# Patient Record
Sex: Female | Born: 1935 | Race: White | Hispanic: No | Marital: Married | Smoking: Never smoker
Health system: Southern US, Community
[De-identification: ages and names within clinical notes are randomized; demographics above are authoritative.]

## PROBLEM LIST (undated history)

## (undated) DIAGNOSIS — M1990 Unspecified osteoarthritis, unspecified site: Secondary | ICD-10-CM

## (undated) DIAGNOSIS — E039 Hypothyroidism, unspecified: Secondary | ICD-10-CM

## (undated) DIAGNOSIS — E079 Disorder of thyroid, unspecified: Secondary | ICD-10-CM

## (undated) DIAGNOSIS — F419 Anxiety disorder, unspecified: Secondary | ICD-10-CM

## (undated) DIAGNOSIS — I10 Essential (primary) hypertension: Secondary | ICD-10-CM

## (undated) HISTORY — PX: TONSILLECTOMY: SUR1361

## (undated) HISTORY — PX: TUBAL LIGATION: SHX77

---

## 1991-10-03 HISTORY — PX: BREAST SURGERY: SHX581

## 1998-07-27 ENCOUNTER — Other Ambulatory Visit: Admission: RE | Admit: 1998-07-27 | Discharge: 1998-07-27 | Payer: Self-pay | Admitting: Obstetrics and Gynecology

## 1999-07-29 ENCOUNTER — Other Ambulatory Visit: Admission: RE | Admit: 1999-07-29 | Discharge: 1999-07-29 | Payer: Self-pay | Admitting: *Deleted

## 2000-08-01 ENCOUNTER — Other Ambulatory Visit: Admission: RE | Admit: 2000-08-01 | Discharge: 2000-08-01 | Payer: Self-pay | Admitting: *Deleted

## 2002-10-07 ENCOUNTER — Other Ambulatory Visit: Admission: RE | Admit: 2002-10-07 | Discharge: 2002-10-07 | Payer: Self-pay | Admitting: Obstetrics and Gynecology

## 2003-03-09 ENCOUNTER — Encounter: Payer: Self-pay | Admitting: Internal Medicine

## 2003-03-09 ENCOUNTER — Ambulatory Visit (HOSPITAL_COMMUNITY): Admission: RE | Admit: 2003-03-09 | Discharge: 2003-03-09 | Payer: Self-pay | Admitting: Internal Medicine

## 2003-03-26 ENCOUNTER — Ambulatory Visit (HOSPITAL_COMMUNITY): Admission: RE | Admit: 2003-03-26 | Discharge: 2003-03-26 | Payer: Self-pay | Admitting: Internal Medicine

## 2003-03-26 ENCOUNTER — Encounter: Payer: Self-pay | Admitting: Internal Medicine

## 2004-06-30 ENCOUNTER — Ambulatory Visit (HOSPITAL_COMMUNITY): Admission: RE | Admit: 2004-06-30 | Discharge: 2004-06-30 | Payer: Self-pay | Admitting: Internal Medicine

## 2004-09-16 ENCOUNTER — Ambulatory Visit (HOSPITAL_COMMUNITY): Admission: RE | Admit: 2004-09-16 | Discharge: 2004-09-16 | Payer: Self-pay | Admitting: Internal Medicine

## 2005-03-22 ENCOUNTER — Encounter: Payer: Self-pay | Admitting: Internal Medicine

## 2005-03-22 ENCOUNTER — Ambulatory Visit: Payer: Self-pay | Admitting: Internal Medicine

## 2005-03-22 ENCOUNTER — Ambulatory Visit (HOSPITAL_COMMUNITY): Admission: RE | Admit: 2005-03-22 | Discharge: 2005-03-22 | Payer: Self-pay | Admitting: Internal Medicine

## 2008-03-05 ENCOUNTER — Ambulatory Visit: Payer: Self-pay | Admitting: Gastroenterology

## 2008-04-01 ENCOUNTER — Ambulatory Visit: Payer: Self-pay | Admitting: Gastroenterology

## 2008-04-01 ENCOUNTER — Encounter: Payer: Self-pay | Admitting: Gastroenterology

## 2008-04-01 ENCOUNTER — Ambulatory Visit (HOSPITAL_COMMUNITY): Admission: RE | Admit: 2008-04-01 | Discharge: 2008-04-01 | Payer: Self-pay | Admitting: Gastroenterology

## 2009-04-29 ENCOUNTER — Ambulatory Visit (HOSPITAL_COMMUNITY): Admission: RE | Admit: 2009-04-29 | Discharge: 2009-04-29 | Payer: Self-pay | Admitting: Ophthalmology

## 2010-10-19 LAB — BASIC METABOLIC PANEL
BUN: 14 mg/dL (ref 6–23)
CO2: 29 mEq/L (ref 19–32)
Calcium: 9.4 mg/dL (ref 8.4–10.5)
Chloride: 97 mEq/L (ref 96–112)
Creatinine, Ser: 1.12 mg/dL (ref 0.4–1.2)
GFR calc Af Amer: 57 mL/min — ABNORMAL LOW (ref >60)
GFR calc non Af Amer: 47 mL/min — ABNORMAL LOW (ref >60)
Glucose, Bld: 83 mg/dL (ref 70–99)
Potassium: 3.5 mEq/L (ref 3.5–5.1)
Sodium: 133 mEq/L — ABNORMAL LOW (ref 135–145)

## 2010-10-19 LAB — HEMOGLOBIN AND HEMATOCRIT, BLOOD
HCT: 35.6 % — ABNORMAL LOW (ref 36.0–46.0)
Hemoglobin: 12.8 g/dL (ref 12.0–15.0)

## 2010-10-24 ENCOUNTER — Ambulatory Visit (HOSPITAL_COMMUNITY)
Admission: RE | Admit: 2010-10-24 | Discharge: 2010-10-24 | Payer: Self-pay | Source: Home / Self Care | Attending: Ophthalmology | Admitting: Ophthalmology

## 2011-01-08 LAB — BASIC METABOLIC PANEL
BUN: 19 mg/dL (ref 6–23)
CO2: 27 mEq/L (ref 19–32)
Calcium: 9.2 mg/dL (ref 8.4–10.5)
Chloride: 97 mEq/L (ref 96–112)
Creatinine, Ser: 0.97 mg/dL (ref 0.4–1.2)
GFR calc Af Amer: >60 mL/min (ref >60)
GFR calc non Af Amer: 56 mL/min — ABNORMAL LOW (ref >60)
Glucose, Bld: 120 mg/dL — ABNORMAL HIGH (ref 70–99)
Potassium: 3.7 mEq/L (ref 3.5–5.1)
Sodium: 132 mEq/L — ABNORMAL LOW (ref 135–145)

## 2011-01-08 LAB — HEMOGLOBIN AND HEMATOCRIT, BLOOD
HCT: 34.3 % — ABNORMAL LOW (ref 36.0–46.0)
Hemoglobin: 12.4 g/dL (ref 12.0–15.0)

## 2011-02-14 NOTE — Op Note (Signed)
[name], [name]                ACCOUNT NO.:  [id number]   MEDICAL RECORD NO.:  [id number]          PATIENT TYPE:  AMB   LOCATION:  DAY                           FACILITY:  [facility]   PHYSICIAN:  Kassie Mends, M.D.      DATE OF BIRTH:  Feb 17, 1933   DATE OF PROCEDURE:  04/01/2008  DATE OF DISCHARGE:                               OPERATIVE REPORT   REFERRING PHYSICIAN:  Kingsley Callander. Ouida Sills, MD   PROCEDURE:  Colonoscopy with cold forceps polypectomy.   INDICATION FOR EXAMINATION:  [name] is a 77 year old female with a  history of adenomatous polyps.   FINDINGS:  1. A 4-mm sessile ascending colon polyp removed via cold forceps.  A 3-      mm sessile transverse colon polyp removed via cold forceps.  A 3-mm      sessile rectal polyp removed via cold forceps.  2. Single diverticulum seen at the rectosigmoid junction.  3. Small internal hemorrhoids.  Otherwise, no masses, inflammatory      changes, or AVMs seen.   DIAGNOSIS:  Small adenomatous-appearing polyps.   RECOMMENDATIONS:  1. Will call [name] with the results of her biopsies.  She will be      scheduled for a screening colonoscopy in 10 years unless she has      evidence of an advanced adenoma.  2. No aspirin, NSAIDs, or anticoagulation for 5 days.  3. She should follow a high-fiber diet.  She is given a handout on      high-fiber diet, polyps, diverticulosis, and hemorrhoids.   MEDICATIONS:  1. Demerol 50 mg IV.  2. Versed 2 mg IV.   PROCEDURE TECHNIQUE:  Physical exam was performed.  Informed consents  was obtained from the patient explaining benefits, risks, and  alternatives.  The  patient was connected to monitor and placed in the  left lateral position.  Continuous oxygen was provided by nasal cannula.  IV medicine administered through an indwelling cannula.  After  administration of sedation and rectal exam, the patient's rectum  intubated.  The scope advanced under direct visualization to the cecum.  The scope was  removed slowly by carefully examining the color, texture,  anatomy, and integrity mucosa on the way out.  The patient was recovered  in endoscopy and discharged home in satisfactory condition.   PATH:  Hyperpalstic polyps.      Kassie Mends, M.D.  Electronically Signed     SM/MEDQ  D:  04/01/2008  T:  04/01/2008  Job:  [id number]   cc:   Kingsley Callander. Ouida Sills, MD  Fax: [phone]

## 2011-02-14 NOTE — H&P (Signed)
NAMEANNALEAH, [name]                ACCOUNT NO.:  [id number]   MEDICAL RECORD NO.:  [id number]          PATIENT TYPE:  AMB   LOCATION:  DAY                           FACILITY:  [facility]   PHYSICIAN:  Kassie Mends, M.D.      DATE OF BIRTH:  11-Mar-1933   DATE OF ADMISSION:  DATE OF DISCHARGE:  LH                              HISTORY & PHYSICAL   PRIMARY CARE PHYSICIAN:  Kingsley Callander. Ouida Sills, MD   CHIEF COMPLAINT:  Followup adenomatous colon polyps/constipation.   HISTORY OF PRESENT ILLNESS:  [name] is a 77 year old Caucasian  female.  She has a history of adenomatous cecal polyp with adenomatous  change, which extended to the margins.  Her polyp was 6 mm and  pedunculated.  She has been having chronic constipation and some  tenesmus.  She has been using stool softeners, which did not seem to  help.  She has tried suppositories, which seemed to help some as well as  Dulcolax.  She has started Metamucil b.i.d. as well as prune juice, this  seems to help the most.  She is now having 1-2 soft bowel movements per  day.  Previously, she was going up to 2 days without a bowel movement  and having to take laxatives.  She tells me she was previously straining  and sitting long periods of time on the commode.  She has noticed one  account of small bright red blood on the toilet paper with wiping and  felt this was due to a hemorrhoid about 10 days ago.  She denies any  abdominal pain, except for an occasional cramp when she takes laxatives.  Denies any nausea, vomiting, heartburn, indigestion, dysphagia, or  odynophagia.  Her weight has remained stable.  She denies any anorexia.   PAST MEDICAL/SURGICAL HISTORY:  1. History of adenomatous cecal polyp with changes which extended to      the margins on colonoscopy on March 22, 2005.  2. Hypertension.  3. Hyperlipidemia.  4. Hypothyroidism.  5. Anxiety.  6. Vertigo.  7. Tubal ligation.  8. Tonsillectomy.   ALLERGIES:  SULFA.   CURRENT MEDICATIONS:  1. Levothyroxine 100 mcg daily.  2. Simvastatin 40 mg daily.  3. Lisinopril/hydrochlorothiazide 20/25 mg daily.  4. Fluoxetine 20 mg daily.  5. Diazepam 2 mg daily.  6. Trazodone 100 mg daily.  7. PreserVision once daily.  8. Multivitamin daily.  9. Metamucil b.i.d.   FAMILY HISTORY:  There is no known family history of colorectal  carcinoma, liver, or chronic GI problems.  Mother deceased at 5,  secondary to old age.  Father deceased at age 26, secondary to CVA.  She  has lost one sister due to lung disease.  One sister with rheumatoid  arthritis and two siblings with hypertension.  One healthy.   SOCIAL HISTORY:  [name] is married.  She has three healthy children.  She is originally from [place], [place], who moved to the [place] in 1958.  She works part-time with the mentally challenged.  She denies any  tobacco, alcohol, or drug use.   REVIEW OF  SYSTEMS:  See HPI, otherwise negative.   PHYSICAL EXAMINATION:  VITAL SIGNS:  Weight 162 pounds, height 66-1/2  inches, temperature 98.1, blood pressure 154/78, and pulse 72.  GENERAL:  [name] is a 77 year old female, who is alert, oriented,  pleasant, and cooperative in no acute distress.  HEENT:  Sclerae clear, nonicteric, conjunctivae pink.  Oropharynx pink  and moist without any lesions.NECK:  Supple without any mass or  thyromegaly.CHEST:  Heart, regular rate and rhythm.  Normal S1 and S2,  without murmurs, clicks, rubs, or gallops.LUNGS:  Clear to auscultation  bilaterally.ABDOMEN:  Positive bowel sounds x4.  No bruits auscultated.  Soft, nontender, and nondistended.  No palpable mass or  hepatosplenomegaly.  No rebound tenderness or guarding.  EXTREMITIES:  Without clubbing or edema.SKIN:  Pink, warm, and dry  without any rash or jaundice.   IMPRESSION:  [name] is a 77 year old female with history of  constipation, which has responded to fiber.  She has also had small  volume of hematochezia on one occasion and has  history of fecal  adenomatous polyp, which extended to the margins on last colonoscopy 3  years ago.  She is due for followup colonoscopy at this time.   PLAN:  Colonoscopy with Dr. Kassie Mends in the near future.  I have  discussed this procedure including risks and benefits to include, but  not limited to bleeding, infection, perforation, or drug reaction.  She  agrees with the plan, and consent will be obtained.  She is requesting change from Dr. Jena Gauss to Dr. Cira Servant' care due to  scheduling conflict.   Thank you Dr. Ouida Sills for allowing me to participate in the care of [name].      Lorenza Burton, N.P.      Kassie Mends, M.D.  Electronically Signed   KJ/MEDQ  D:  03/05/2008  T:  03/06/2008  Job:  [id number]   cc:   Kingsley Callander. Ouida Sills, MD  Fax: [phone]

## 2011-02-17 NOTE — Op Note (Signed)
[name], [name]                ACCOUNT NO.:  [id number]   MEDICAL RECORD NO.:  [id number]          PATIENT TYPE:  AMB   LOCATION:  DAY                           FACILITY:  [facility]   PHYSICIAN:  R. Roetta Sessions, M.D. DATE OF BIRTH:  1933-08-03   DATE OF PROCEDURE:  03/22/2005  DATE OF DISCHARGE:                                 OPERATIVE REPORT   PROCEDURE:  Colonoscopy with snare polypectomy.   INDICATIONS:  The patient is a 77 year old lady referred at the courtesy of  Dr. Ouida Sills, devoid of any lower GI tract symptoms who comes for colon cancer  screening.  She had a sigmoidoscopy by Dr. Ouida Sills a number of years ago  without significant findings.  She stated the above but does not have any  bowel symptoms.  There is no family history of colorectal neoplasia.  Colonoscopy is now being done as a standard screening maneuver.  This  approach has been discussed with the patient and family.  The potential  risks, benefits and alternatives have been reviewed, questions answered and  he is agreeable.  Please see documentation in the medical record.   DESCRIPTION OF PROCEDURE:  Oxygen saturation, blood pressure, pulse and  respiration were monitored throughout the entire procedure.  Conscious  sedation with Versed 3 mg IV and Demerol 50 mg IV in divided doses.  The  instrument was the Olympus video chip system.   FINDINGS:  Digital rectal exam initially revealed no abnormalities.   ENDOSCOPIC FINDINGS:  Prep was good.   Rectum:  Examination of the rectal mucosa including retroflexion in the anal  verge revealed no abnormalities.   Colon:  Colonic mucosa was surveyed from the rectosigmoid junction through  the left, transverse,  right colon to the area of the appendiceal orifice,  ileocecal valve and cecum.  These structures were well seen, photographed  for the record.  From the level the scope was slowly withdrawn.  All  previously mentioned mucosal surfaces were again seen.  The  patient was  noted to have a 6 mm pedunculated polyp in the base of the cecum a few  centimeters over from the appendiceal orifice.  It was cold snared and  recovered through the scope.  The remainder of the colonic mucosa appeared  normal.  The patient tolerated the procedure well and was reactive after  endoscopy.   IMPRESSION:  1.  Normal rectum.  2.  Cecal polyp.  3.  Otherwise normal colon.  4.  Polyp removed with snare as described above.   RECOMMENDATIONS:  1.  No aspirin or __________ medications for 10 days.  2.  Follow up on pathology.  3.  Further recommendations to follow.       RMR/MEDQ  D:  03/22/2005  T:  03/22/2005  Job:  [id number]   cc:   Kingsley Callander. Ouida Sills, MD  [address]  [place]  [place] [address]  Fax: [phone]

## 2012-10-25 ENCOUNTER — Ambulatory Visit (HOSPITAL_COMMUNITY)
Admission: RE | Admit: 2012-10-25 | Discharge: 2012-10-25 | Disposition: A | Discharge status: Home / Self Care | Payer: Medicare Other | Source: Ambulatory Visit | Attending: Internal Medicine | Admitting: Internal Medicine

## 2012-10-25 ENCOUNTER — Other Ambulatory Visit (HOSPITAL_COMMUNITY): Payer: Self-pay | Admitting: Internal Medicine

## 2012-10-25 DIAGNOSIS — W19XXXA Unspecified fall, initial encounter: Secondary | ICD-10-CM

## 2012-10-25 DIAGNOSIS — S32509A Unspecified fracture of unspecified pubis, initial encounter for closed fracture: Secondary | ICD-10-CM | POA: Insufficient documentation

## 2012-10-25 DIAGNOSIS — M25559 Pain in unspecified hip: Secondary | ICD-10-CM | POA: Insufficient documentation

## 2014-05-12 ENCOUNTER — Other Ambulatory Visit (HOSPITAL_COMMUNITY): Payer: Self-pay | Admitting: Internal Medicine

## 2014-05-12 ENCOUNTER — Ambulatory Visit (HOSPITAL_COMMUNITY)
Admission: RE | Admit: 2014-05-12 | Discharge: 2014-05-12 | Disposition: A | Discharge status: Home / Self Care | Payer: Medicare Other | Source: Ambulatory Visit | Attending: Internal Medicine | Admitting: Internal Medicine

## 2014-05-12 DIAGNOSIS — R52 Pain, unspecified: Secondary | ICD-10-CM | POA: Diagnosis present

## 2014-05-12 DIAGNOSIS — M25559 Pain in unspecified hip: Secondary | ICD-10-CM | POA: Diagnosis not present

## 2014-05-12 DIAGNOSIS — M76899 Other specified enthesopathies of unspecified lower limb, excluding foot: Secondary | ICD-10-CM | POA: Diagnosis not present

## 2014-10-19 DIAGNOSIS — Z961 Presence of intraocular lens: Secondary | ICD-10-CM | POA: Diagnosis not present

## 2014-10-19 DIAGNOSIS — H524 Presbyopia: Secondary | ICD-10-CM | POA: Diagnosis not present

## 2014-10-19 DIAGNOSIS — H35373 Puckering of macula, bilateral: Secondary | ICD-10-CM | POA: Diagnosis not present

## 2015-01-22 DIAGNOSIS — M9903 Segmental and somatic dysfunction of lumbar region: Secondary | ICD-10-CM | POA: Diagnosis not present

## 2015-01-22 DIAGNOSIS — M9905 Segmental and somatic dysfunction of pelvic region: Secondary | ICD-10-CM | POA: Diagnosis not present

## 2015-01-22 DIAGNOSIS — M5441 Lumbago with sciatica, right side: Secondary | ICD-10-CM | POA: Diagnosis not present

## 2015-01-22 DIAGNOSIS — M9902 Segmental and somatic dysfunction of thoracic region: Secondary | ICD-10-CM | POA: Diagnosis not present

## 2015-01-25 DIAGNOSIS — M9905 Segmental and somatic dysfunction of pelvic region: Secondary | ICD-10-CM | POA: Diagnosis not present

## 2015-01-25 DIAGNOSIS — M9903 Segmental and somatic dysfunction of lumbar region: Secondary | ICD-10-CM | POA: Diagnosis not present

## 2015-01-25 DIAGNOSIS — M9902 Segmental and somatic dysfunction of thoracic region: Secondary | ICD-10-CM | POA: Diagnosis not present

## 2015-01-25 DIAGNOSIS — M5441 Lumbago with sciatica, right side: Secondary | ICD-10-CM | POA: Diagnosis not present

## 2015-01-28 DIAGNOSIS — M5416 Radiculopathy, lumbar region: Secondary | ICD-10-CM | POA: Diagnosis not present

## 2015-02-09 DIAGNOSIS — M1611 Unilateral primary osteoarthritis, right hip: Secondary | ICD-10-CM | POA: Diagnosis not present

## 2015-03-02 DIAGNOSIS — E039 Hypothyroidism, unspecified: Secondary | ICD-10-CM | POA: Diagnosis not present

## 2015-03-02 DIAGNOSIS — E785 Hyperlipidemia, unspecified: Secondary | ICD-10-CM | POA: Diagnosis not present

## 2015-03-02 DIAGNOSIS — Z79899 Other long term (current) drug therapy: Secondary | ICD-10-CM | POA: Diagnosis not present

## 2015-03-02 DIAGNOSIS — I10 Essential (primary) hypertension: Secondary | ICD-10-CM | POA: Diagnosis not present

## 2015-03-03 DIAGNOSIS — M1611 Unilateral primary osteoarthritis, right hip: Secondary | ICD-10-CM | POA: Diagnosis not present

## 2015-03-09 ENCOUNTER — Other Ambulatory Visit (HOSPITAL_COMMUNITY): Payer: Self-pay | Admitting: Internal Medicine

## 2015-03-09 DIAGNOSIS — N183 Chronic kidney disease, stage 3 (moderate): Secondary | ICD-10-CM | POA: Diagnosis not present

## 2015-03-09 DIAGNOSIS — E039 Hypothyroidism, unspecified: Secondary | ICD-10-CM | POA: Diagnosis not present

## 2015-03-09 DIAGNOSIS — I10 Essential (primary) hypertension: Secondary | ICD-10-CM | POA: Diagnosis not present

## 2015-03-09 DIAGNOSIS — N390 Urinary tract infection, site not specified: Secondary | ICD-10-CM | POA: Diagnosis not present

## 2015-03-09 DIAGNOSIS — Z1231 Encounter for screening mammogram for malignant neoplasm of breast: Secondary | ICD-10-CM

## 2015-03-09 DIAGNOSIS — N3000 Acute cystitis without hematuria: Secondary | ICD-10-CM | POA: Diagnosis not present

## 2015-03-24 ENCOUNTER — Ambulatory Visit (HOSPITAL_COMMUNITY)
Admission: RE | Admit: 2015-03-24 | Discharge: 2015-03-24 | Disposition: A | Discharge status: Home / Self Care | Payer: Medicare Other | Source: Ambulatory Visit | Attending: Internal Medicine | Admitting: Internal Medicine

## 2015-03-24 DIAGNOSIS — Z1231 Encounter for screening mammogram for malignant neoplasm of breast: Secondary | ICD-10-CM | POA: Insufficient documentation

## 2015-04-03 DIAGNOSIS — M1611 Unilateral primary osteoarthritis, right hip: Secondary | ICD-10-CM | POA: Diagnosis not present

## 2015-04-10 DIAGNOSIS — M1611 Unilateral primary osteoarthritis, right hip: Secondary | ICD-10-CM | POA: Diagnosis not present

## 2015-04-13 ENCOUNTER — Other Ambulatory Visit: Payer: Self-pay | Admitting: Internal Medicine

## 2015-04-13 DIAGNOSIS — R928 Other abnormal and inconclusive findings on diagnostic imaging of breast: Secondary | ICD-10-CM

## 2015-04-14 ENCOUNTER — Other Ambulatory Visit: Payer: Self-pay | Admitting: Surgical

## 2015-04-14 ENCOUNTER — Other Ambulatory Visit: Payer: Self-pay | Admitting: Internal Medicine

## 2015-04-14 MED ORDER — BUPIVACAINE LIPOSOME 1.3 % IJ SUSP: 20.0000 mL | Freq: Once | INTRAMUSCULAR | Status: AC

## 2015-05-04 ENCOUNTER — Encounter (HOSPITAL_COMMUNITY): Payer: Self-pay

## 2015-05-04 ENCOUNTER — Encounter (HOSPITAL_COMMUNITY)
Admission: RE | Admit: 2015-05-04 | Discharge: 2015-05-04 | Disposition: A | Discharge status: Home / Self Care | Payer: Medicare Other | Source: Ambulatory Visit | Attending: Orthopedic Surgery | Admitting: Orthopedic Surgery

## 2015-05-04 ENCOUNTER — Ambulatory Visit (HOSPITAL_COMMUNITY)
Admission: RE | Admit: 2015-05-04 | Discharge: 2015-05-04 | Disposition: A | Discharge status: Home / Self Care | Payer: Medicare Other | Source: Ambulatory Visit | Attending: Surgical | Admitting: Surgical

## 2015-05-04 DIAGNOSIS — Z01810 Encounter for preprocedural cardiovascular examination: Secondary | ICD-10-CM | POA: Diagnosis not present

## 2015-05-04 DIAGNOSIS — Z0183 Encounter for blood typing: Secondary | ICD-10-CM | POA: Diagnosis not present

## 2015-05-04 DIAGNOSIS — I10 Essential (primary) hypertension: Secondary | ICD-10-CM | POA: Insufficient documentation

## 2015-05-04 DIAGNOSIS — Z01818 Encounter for other preprocedural examination: Secondary | ICD-10-CM | POA: Diagnosis not present

## 2015-05-04 DIAGNOSIS — Z01812 Encounter for preprocedural laboratory examination: Secondary | ICD-10-CM | POA: Insufficient documentation

## 2015-05-04 HISTORY — DX: Unspecified osteoarthritis, unspecified site: M19.90

## 2015-05-04 HISTORY — DX: Essential (primary) hypertension: I10

## 2015-05-04 HISTORY — DX: Disorder of thyroid, unspecified: E07.9

## 2015-05-04 LAB — CBC WITH DIFFERENTIAL/PLATELET
Basophils Absolute: 0 10*3/uL (ref 0.0–0.1)
Basophils Relative: 1 % (ref 0–1)
Eosinophils Absolute: 0.4 10*3/uL (ref 0.0–0.7)
Eosinophils Relative: 5 % (ref 0–5)
HCT: 33.6 % — ABNORMAL LOW (ref 36.0–46.0)
Hemoglobin: 11.5 g/dL — ABNORMAL LOW (ref 12.0–15.0)
Lymphocytes Relative: 29 % (ref 12–46)
Lymphs Abs: 2.3 10*3/uL (ref 0.7–4.0)
MCH: 29 pg (ref 26.0–34.0)
MCHC: 34.2 g/dL (ref 30.0–36.0)
MCV: 84.6 fL (ref 78.0–100.0)
Monocytes Absolute: 0.6 10*3/uL (ref 0.1–1.0)
Monocytes Relative: 7 % (ref 3–12)
Neutro Abs: 4.6 10*3/uL (ref 1.7–7.7)
Neutrophils Relative %: 58 % (ref 43–77)
Platelets: 233 10*3/uL (ref 150–400)
RBC: 3.97 MIL/uL (ref 3.87–5.11)
RDW: 12.6 % (ref 11.5–15.5)
WBC: 7.8 10*3/uL (ref 4.0–10.5)

## 2015-05-04 LAB — SURGICAL PCR SCREEN
MRSA, PCR: INVALID — AB
STAPHYLOCOCCUS AUREUS: INVALID — AB

## 2015-05-04 LAB — URINALYSIS, ROUTINE W REFLEX MICROSCOPIC
Bilirubin Urine: NEGATIVE
Glucose, UA: NEGATIVE mg/dL
Ketones, ur: NEGATIVE mg/dL
Nitrite: NEGATIVE
Protein, ur: NEGATIVE mg/dL
Specific Gravity, Urine: 1.013 (ref 1.005–1.030)
Urobilinogen, UA: 1 mg/dL (ref 0.0–1.0)
pH: 6 (ref 5.0–8.0)

## 2015-05-04 LAB — COMPREHENSIVE METABOLIC PANEL
ALT: 10 U/L — ABNORMAL LOW (ref 14–54)
AST: 19 U/L (ref 15–41)
Albumin: 3.8 g/dL (ref 3.5–5.0)
Alkaline Phosphatase: 71 U/L (ref 38–126)
Anion gap: 4 — ABNORMAL LOW (ref 5–15)
BUN: 18 mg/dL (ref 6–20)
CO2: 28 mmol/L (ref 22–32)
Calcium: 8.9 mg/dL (ref 8.9–10.3)
Chloride: 99 mmol/L — ABNORMAL LOW (ref 101–111)
Creatinine, Ser: 1.02 mg/dL — ABNORMAL HIGH (ref 0.44–1.00)
GFR calc Af Amer: 59 mL/min — ABNORMAL LOW (ref >60)
GFR calc non Af Amer: 51 mL/min — ABNORMAL LOW (ref >60)
Glucose, Bld: 113 mg/dL — ABNORMAL HIGH (ref 65–99)
Potassium: 3.8 mmol/L (ref 3.5–5.1)
Sodium: 131 mmol/L — ABNORMAL LOW (ref 135–145)
Total Bilirubin: 0.7 mg/dL (ref 0.3–1.2)
Total Protein: 6.9 g/dL (ref 6.5–8.1)

## 2015-05-04 LAB — URINE MICROSCOPIC-ADD ON

## 2015-05-04 LAB — APTT: APTT: 31 s (ref 24–37)

## 2015-05-04 LAB — PROTIME-INR
INR: 1.05 (ref 0.00–1.49)
Prothrombin Time: 13.9 seconds (ref 11.6–15.2)

## 2015-05-04 NOTE — Patient Instructions (Addendum)
YOUR PROCEDURE IS SCHEDULED ON :  05/12/15  REPORT TO [name] MAIN ENTRANCE FOLLOW SIGNS TO EAST ELEVATOR - GO TO 3rd FLOOR CHECK IN AT 3 EAST NURSES STATION (SHORT STAY) AT:  9:30 AM  CALL THIS NUMBER IF YOU HAVE PROBLEMS THE MORNING OF SURGERY [phone]  REMEMBER:ONLY 1 PER PERSON MAY GO TO SHORT STAY WITH YOU TO GET READY THE MORNING OF YOUR SURGERY  DO NOT EAT FOOD OR DRINK LIQUIDS AFTER MIDNIGHT  TAKE THESE MEDICINES THE MORNING OF SURGERY:  LEVOTHYROXINE / TRAMADOL IF NEEDED  YOU MAY NOT HAVE ANY METAL ON YOUR BODY INCLUDING HAIR PINS AND PIERCING'S. DO NOT WEAR JEWELRY, MAKEUP, LOTIONS, POWDERS OR PERFUMES. DO NOT WEAR NAIL POLISH. DO NOT SHAVE 48 HRS PRIOR TO SURGERY. MEN MAY SHAVE FACE AND NECK.  DO NOT BRING VALUABLES TO [facility]. [facility] IS NOT RESPONSIBLE FOR VALUABLES.  CONTACTS, DENTURES OR PARTIALS MAY NOT BE WORN TO SURGERY. LEAVE SUITCASE IN CAR. CAN BE BROUGHT TO ROOM AFTER SURGERY.  PATIENTS DISCHARGED THE DAY OF SURGERY WILL NOT BE ALLOWED TO DRIVE HOME.  PLEASE READ OVER THE FOLLOWING INSTRUCTION SHEETS _________________________________________________________________________________                                          [facility] - PREPARING FOR SURGERY  Before surgery, you can play an important role.  Because skin is not sterile, your skin needs to be as free of germs as possible.  You can reduce the number of germs on your skin by washing with CHG (chlorahexidine gluconate) soap before surgery.  CHG is an antiseptic cleaner which kills germs and bonds with the skin to continue killing germs even after washing. Please DO NOT use if you have an allergy to CHG or antibacterial soaps.  If your skin becomes reddened/irritated stop using the CHG and inform your nurse when you arrive at Short Stay. Do not shave (including legs and underarms) for at least 48 hours prior to the first CHG shower.  You may shave your face. Please follow  these instructions carefully:   1.  Shower with CHG Soap the night before surgery and the  morning of Surgery.   2.  If you choose to wash your hair, wash your hair first as usual with your  normal  Shampoo.   3.  After you shampoo, rinse your hair and body thoroughly to remove the  shampoo.                                         4.  Use CHG as you would any other liquid soap.  You can apply chg directly  to the skin and wash . Gently wash with scrungie or clean wascloth    5.  Apply the CHG Soap to your body ONLY FROM THE NECK DOWN.   Do not use on open                           Wound or open sores. Avoid contact with eyes, ears mouth and genitals (private parts).                        Genitals (private parts) with  your normal soap.              6.  Wash thoroughly, paying special attention to the area where your surgery  will be performed.   7.  Thoroughly rinse your body with warm water from the neck down.   8.  DO NOT shower/wash with your normal soap after using and rinsing off  the CHG Soap .                9.  Pat yourself dry with a clean towel.             10.  Wear clean night clothes to bed after shower             11.  Place clean sheets on your bed the night of your first shower and do not  sleep with pets.  Day of Surgery : Do not apply any lotions/deodorants the morning of surgery.  Please wear clean clothes to the [facility]/surgery center.  FAILURE TO FOLLOW THESE INSTRUCTIONS MAY RESULT IN THE CANCELLATION OF YOUR SURGERY    PATIENT SIGNATURE_________________________________  ______________________________________________________________________     Adam Phenix  An incentive spirometer is a tool that can help keep your lungs clear and active. This tool measures how well you are filling your lungs with each breath. Taking long deep breaths may help reverse or decrease the chance of developing breathing (pulmonary) problems (especially infection)  following:  A long period of time when you are unable to move or be active. BEFORE THE PROCEDURE   If the spirometer includes an indicator to show your best effort, your nurse or respiratory therapist will set it to a desired goal.  If possible, sit up straight or lean slightly forward. Try not to slouch.  Hold the incentive spirometer in an upright position. INSTRUCTIONS FOR USE   Sit on the edge of your bed if possible, or sit up as far as you can in bed or on a chair.  Hold the incentive spirometer in an upright position.  Breathe out normally.  Place the mouthpiece in your mouth and seal your lips tightly around it.  Breathe in slowly and as deeply as possible, raising the piston or the ball toward the top of the column.  Hold your breath for 3-5 seconds or for as long as possible. Allow the piston or ball to fall to the bottom of the column.  Remove the mouthpiece from your mouth and breathe out normally.  Rest for a few seconds and repeat Steps 1 through 7 at least 10 times every 1-2 hours when you are awake. Take your time and take a few normal breaths between deep breaths.  The spirometer may include an indicator to show your best effort. Use the indicator as a goal to work toward during each repetition.  After each set of 10 deep breaths, practice coughing to be sure your lungs are clear. If you have an incision (the cut made at the time of surgery), support your incision when coughing by placing a pillow or rolled up towels firmly against it. Once you are able to get out of bed, walk around indoors and cough well. You may stop using the incentive spirometer when instructed by your caregiver.  RISKS AND COMPLICATIONS  Take your time so you do not get dizzy or light-headed.  If you are in pain, you may need to take or ask for pain medication before doing incentive spirometry. It is harder to take a  deep breath if you are having pain. AFTER USE  Rest and breathe slowly  and easily.  It can be helpful to keep track of a log of your progress. Your caregiver can provide you with a simple table to help with this. If you are using the spirometer at home, follow these instructions: [place] IF:   You are having difficultly using the spirometer.  You have trouble using the spirometer as often as instructed.  Your pain medication is not giving enough relief while using the spirometer.  You develop fever of 100.5 F (38.1 C) or higher. SEEK IMMEDIATE MEDICAL CARE IF:   You cough up bloody sputum that had not been present before.  You develop fever of 102 F (38.9 C) or greater.  You develop worsening pain at or near the incision site. MAKE SURE YOU:   Understand these instructions.  Will watch your condition.  Will get help right away if you are not doing well or get worse. Document Released: 01/29/2007 Document Revised: 12/11/2011 Document Reviewed: 04/01/2007 [employer] Patient Information 2014 [employer], [place].   ________________________________________________________________________  WHAT IS A BLOOD TRANSFUSION? Blood Transfusion Information  A transfusion is the replacement of blood or some of its parts. Blood is made up of multiple cells which provide different functions.  Red blood cells carry oxygen and are used for blood loss replacement.  White blood cells fight against infection.  Platelets control bleeding.  Plasma helps clot blood.  Other blood products are available for specialized needs, such as hemophilia or other clotting disorders. BEFORE THE TRANSFUSION  Who gives blood for transfusions?   Healthy volunteers who are fully evaluated to make sure their blood is safe. This is blood bank blood. Transfusion therapy is the safest it has ever been in the practice of medicine. Before blood is taken from a donor, a complete history is taken to make sure that person has no history of diseases nor engages in risky social  behavior (examples are intravenous drug use or sexual activity with multiple partners). The donor's travel history is screened to minimize risk of transmitting infections, such as malaria. The donated blood is tested for signs of infectious diseases, such as HIV and hepatitis. The blood is then tested to be sure it is compatible with you in order to minimize the chance of a transfusion reaction. If you or a relative donates blood, this is often done in anticipation of surgery and is not appropriate for emergency situations. It takes many days to process the donated blood. RISKS AND COMPLICATIONS Although transfusion therapy is very safe and saves many lives, the main dangers of transfusion include:   Getting an infectious disease.  Developing a transfusion reaction. This is an allergic reaction to something in the blood you were given. Every precaution is taken to prevent this. The decision to have a blood transfusion has been considered carefully by your caregiver before blood is given. Blood is not given unless the benefits outweigh the risks. AFTER THE TRANSFUSION  Right after receiving a blood transfusion, you will usually feel much better and more energetic. This is especially true if your red blood cells have gotten low (anemic). The transfusion raises the level of the red blood cells which carry oxygen, and this usually causes an energy increase.  The nurse administering the transfusion will monitor you carefully for complications. HOME CARE INSTRUCTIONS  No special instructions are needed after a transfusion. You may find your energy is better. Speak with your caregiver about  any limitations on activity for underlying diseases you may have. SEEK MEDICAL CARE IF:   Your condition is not improving after your transfusion.  You develop redness or irritation at the intravenous (IV) site. SEEK IMMEDIATE MEDICAL CARE IF:  Any of the following symptoms occur over the next 12 hours:  Shaking  chills.  You have a temperature by mouth above 102 F (38.9 C), not controlled by medicine.  Chest, back, or muscle pain.  People around you feel you are not acting correctly or are confused.  Shortness of breath or difficulty breathing.  Dizziness and fainting.  You get a rash or develop hives.  You have a decrease in urine output.  Your urine turns a dark color or changes to pink, red, or brown. Any of the following symptoms occur over the next 10 days:  You have a temperature by mouth above 102 F (38.9 C), not controlled by medicine.  Shortness of breath.  Weakness after normal activity.  The white part of the eye turns yellow (jaundice).  You have a decrease in the amount of urine or are urinating less often.  Your urine turns a dark color or changes to pink, red, or brown. Document Released: 09/15/2000 Document Revised: 12/11/2011 Document Reviewed: 05/04/2008 [facility] Patient Information 2014 [place], [place].  _______________________________________________________________________

## 2015-05-05 NOTE — Progress Notes (Signed)
Abnormal UA faxed to Dr.Gioffre 

## 2015-05-06 LAB — MRSA CULTURE

## 2015-05-06 NOTE — H&P (Signed)
TOTAL HIP ADMISSION H&P  Patient is being admitted for right total hip arthroplasty.  Subjective:  Chief Complaint:right hip pain.  HPI: [name], 81 y.o. female, has a history of pain and functional disability in the right hip due to arthritis and has failed non-surgical conservative treatments for greater than 12 weeks to includeNSAID's and/or analgesics, corticosteriod injections, use of assistive devices and activity modification.  Onset of symptoms was gradual, starting 1 year ago with gradually worsening course since that time. The patient noted no past surgery on the right hip(s).  Patient currently rates pain in the right hip(s) at 8 out of 10 with activity. Patient has night pain, worsening of pain with activity and weight bearing, pain that interferes with activities of daily living, pain with passive range of motion and crepitus.  Patient has evidence of periarticular osteophytes and joint space narrowing by imaging studies.There is no active infection.   Past Medical History  Diagnosis Date  . Hypertension   . Arthritis   . Thyroid disease     Past Surgical History  Procedure Laterality Date  . Tonsillectomy    . Tubal ligation    . Breast surgery  1993    L BREAST BX - BENIGN      Current outpatient prescriptions:  .  levothyroxine (SYNTHROID, LEVOTHROID) 112 MCG tablet, Take 112 mcg by mouth daily before breakfast., Disp: , Rfl:  .  lisinopril-hydrochlorothiazide (PRINZIDE,ZESTORETIC) 20-25 MG per tablet, Take 1 tablet by mouth every evening., Disp: , Rfl:  .  naproxen sodium (ANAPROX) 220 MG tablet, Take 440 mg by mouth 2 (two) times daily as needed (pain)., Disp: , Rfl:  .  traMADol (ULTRAM) 50 MG tablet, Take 50 mg by mouth daily as needed for moderate pain., Disp: , Rfl:  .  traZODone (DESYREL) 50 MG tablet, Take 100 mg by mouth at bedtime., Disp: , Rfl:     Allergies  Allergen Reactions  . Sulfa Antibiotics Other (See Comments)    "like pens and  needles all over my body, tingling"--pt states she isnt sure if she is allergic to it because she was on another medication at the same time.     History  Substance Use Topics  . Smoking status: Never Smoker   . Smokeless tobacco: No  . Alcohol Use: Yes     Comment: RARE      Review of Systems  Constitutional: Negative.   HENT: Negative.   Eyes: Negative.   Cardiovascular: Negative.   Gastrointestinal: Negative.   Genitourinary: Negative.   Musculoskeletal: Positive for back pain and joint pain. Negative for myalgias, falls and neck pain.       Right hip pain  Skin: Negative.   Neurological: Negative.   Endo/Heme/Allergies: Negative.   Psychiatric/Behavioral: Negative.     Objective:  Physical Exam  Constitutional: She is oriented to person, place, and time. She appears well-developed and well-nourished. No distress.  HENT:  Head: Normocephalic and atraumatic.  Right Ear: External ear normal.  Left Ear: External ear normal.  Nose: Nose normal.  Mouth/Throat: Oropharynx is clear and moist.  Eyes: Conjunctivae and EOM are normal.  Neck: Normal range of motion. Neck supple.  Cardiovascular: Normal rate, regular rhythm, normal heart sounds and intact distal pulses.   No murmur heard. Respiratory: Effort normal and breath sounds normal. No respiratory distress. She has no wheezes.  GI: Soft. Bowel sounds are normal.  Musculoskeletal:       Right hip: She exhibits decreased range of  motion, decreased strength and crepitus.       Left hip: Normal.       Right knee: Normal.       Left knee: Normal.  Neurological: She is alert and oriented to person, place, and time. She has normal strength and normal reflexes. No sensory deficit.  Skin: No rash noted. She is not diaphoretic. No erythema.  Psychiatric: She has a normal mood and affect. Her behavior is normal.   Vitals  Weight: 150 lb Height: 65in Body Surface Area: 1.75 m Body Mass Index: 24.96 kg/m  Pulse: 76  (Regular)  BP: 152/84 (Sitting, Left Arm, Standard)   Imaging Review Plain radiographs demonstrate severe degenerative joint disease of the right hip(s). The bone quality appears to be good for age and reported activity level.  Assessment/Plan:  End stage primary osteoarthritis, right hip  The patient history, physical examination, clinical judgment of the provider and imaging studies are consistent with end stage degenerative joint disease of the right hip(s) and total hip arthroplasty is deemed medically necessary. The treatment options including medical management, injection therapy arthroscopy and arthroplasty were discussed at length. The risks and benefits of total hip arthroplasty were presented and reviewed. The risks due to aseptic loosening, infection, stiffness, patella tracking problems, thromboembolic complications and other imponderables were discussed. The patient acknowledged the explanation, agreed to proceed with the plan and consent was signed. Patient is being admitted for inpatient treatment for surgery, pain control, PT, OT, prophylactic antibiotics, VTE prophylaxis, progressive ambulation and ADL's and discharge planning. The patient is planning to be discharged to skilled nursing facility    TXA IV PCP: Dr. Rita Ohara  Dimitri Ped, PA-C

## 2015-05-10 NOTE — Progress Notes (Signed)
   05/04/15 1443  OBSTRUCTIVE SLEEP APNEA  Have you ever been diagnosed with sleep apnea through a sleep study? No  Do you snore loudly (loud enough to be heard through closed doors)?  1  Do you often feel tired, fatigued, or sleepy during the daytime? 1  Has anyone observed you stop breathing during your sleep? 0  Do you have, or are you being treated for high blood pressure? 1  BMI more than 35 kg/m2? 0  Age over 79 years old? 1  Neck circumference greater than 40 cm/16 inches? 0  Gender: 0

## 2015-05-11 LAB — TYPE AND SCREEN
ABO/RH(D): A NEG
Antibody Screen: POSITIVE
DAT, IgG: NEGATIVE

## 2015-05-12 ENCOUNTER — Inpatient Hospital Stay (HOSPITAL_COMMUNITY): Payer: Medicare Other

## 2015-05-12 ENCOUNTER — Encounter (HOSPITAL_COMMUNITY)
Admission: RE | Disposition: A | Discharge status: Skilled Nursing Facility | Payer: Self-pay | Source: Ambulatory Visit | Attending: Orthopedic Surgery

## 2015-05-12 ENCOUNTER — Encounter (HOSPITAL_COMMUNITY): Payer: Self-pay | Admitting: *Deleted

## 2015-05-12 ENCOUNTER — Inpatient Hospital Stay (HOSPITAL_COMMUNITY): Payer: Medicare Other | Admitting: Anesthesiology

## 2015-05-12 ENCOUNTER — Inpatient Hospital Stay (HOSPITAL_COMMUNITY)
Admission: RE | Admit: 2015-05-12 | Discharge: 2015-05-14 | DRG: 470 | Disposition: A | Discharge status: Skilled Nursing Facility | Payer: Medicare Other | Source: Ambulatory Visit | Attending: Orthopedic Surgery | Admitting: Orthopedic Surgery

## 2015-05-12 DIAGNOSIS — M1611 Unilateral primary osteoarthritis, right hip: Secondary | ICD-10-CM | POA: Diagnosis not present

## 2015-05-12 DIAGNOSIS — E039 Hypothyroidism, unspecified: Secondary | ICD-10-CM | POA: Diagnosis not present

## 2015-05-12 DIAGNOSIS — R339 Retention of urine, unspecified: Secondary | ICD-10-CM | POA: Diagnosis present

## 2015-05-12 DIAGNOSIS — I10 Essential (primary) hypertension: Secondary | ICD-10-CM | POA: Diagnosis not present

## 2015-05-12 DIAGNOSIS — M1990 Unspecified osteoarthritis, unspecified site: Secondary | ICD-10-CM | POA: Diagnosis not present

## 2015-05-12 DIAGNOSIS — Z79899 Other long term (current) drug therapy: Secondary | ICD-10-CM

## 2015-05-12 DIAGNOSIS — Z96641 Presence of right artificial hip joint: Secondary | ICD-10-CM

## 2015-05-12 DIAGNOSIS — Z471 Aftercare following joint replacement surgery: Secondary | ICD-10-CM | POA: Diagnosis not present

## 2015-05-12 DIAGNOSIS — M25551 Pain in right hip: Secondary | ICD-10-CM | POA: Diagnosis present

## 2015-05-12 DIAGNOSIS — Z9981 Dependence on supplemental oxygen: Secondary | ICD-10-CM | POA: Diagnosis not present

## 2015-05-12 DIAGNOSIS — Z96642 Presence of left artificial hip joint: Secondary | ICD-10-CM

## 2015-05-12 DIAGNOSIS — M6281 Muscle weakness (generalized): Secondary | ICD-10-CM | POA: Diagnosis not present

## 2015-05-12 DIAGNOSIS — R262 Difficulty in walking, not elsewhere classified: Secondary | ICD-10-CM | POA: Diagnosis not present

## 2015-05-12 DIAGNOSIS — R278 Other lack of coordination: Secondary | ICD-10-CM | POA: Diagnosis not present

## 2015-05-12 DIAGNOSIS — E079 Disorder of thyroid, unspecified: Secondary | ICD-10-CM | POA: Diagnosis present

## 2015-05-12 DIAGNOSIS — Z96649 Presence of unspecified artificial hip joint: Secondary | ICD-10-CM

## 2015-05-12 HISTORY — PX: TOTAL HIP ARTHROPLASTY: SHX124

## 2015-05-12 SURGERY — ARTHROPLASTY, HIP, TOTAL,POSTERIOR APPROACH
Anesthesia: General | Site: Hip | Laterality: Right

## 2015-05-12 MED ORDER — ALUM & MAG HYDROXIDE-SIMETH 200-200-20 MG/5ML PO SUSP: 30.0000 mL | ORAL | Status: DC | PRN

## 2015-05-12 MED ORDER — METHOCARBAMOL 500 MG PO TABS
500.0000 mg | ORAL_TABLET | Freq: Four times a day (QID) | ORAL | Status: DC | PRN
  Administered 2015-05-13: 500 mg via ORAL
  Filled 2015-05-12: qty 1

## 2015-05-12 MED ORDER — METHOCARBAMOL 1000 MG/10ML IJ SOLN
500.0000 mg | Freq: Four times a day (QID) | INTRAVENOUS | Status: DC | PRN
  Administered 2015-05-12: 500 mg via INTRAVENOUS
  Filled 2015-05-12 (×2): qty 5

## 2015-05-12 MED ORDER — BISACODYL 5 MG PO TBEC
5.0000 mg | DELAYED_RELEASE_TABLET | Freq: Every day | ORAL | Status: DC | PRN
  Administered 2015-05-13: 5 mg via ORAL
  Filled 2015-05-12: qty 1

## 2015-05-12 MED ORDER — CEFAZOLIN SODIUM-DEXTROSE 2-3 GM-% IV SOLR
INTRAVENOUS | Status: AC
  Filled 2015-05-12: qty 50

## 2015-05-12 MED ORDER — FENTANYL CITRATE (PF) 250 MCG/5ML IJ SOLN
INTRAMUSCULAR | Status: DC | PRN
  Administered 2015-05-12 (×2): 100 ug via INTRAVENOUS
  Administered 2015-05-12: 50 ug via INTRAVENOUS
  Administered 2015-05-12: 100 ug via INTRAVENOUS
  Administered 2015-05-12 (×3): 50 ug via INTRAVENOUS

## 2015-05-12 MED ORDER — FENTANYL CITRATE (PF) 250 MCG/5ML IJ SOLN
INTRAMUSCULAR | Status: AC
  Filled 2015-05-12: qty 25

## 2015-05-12 MED ORDER — MENTHOL 3 MG MT LOZG: 1.0000 | LOZENGE | OROMUCOSAL | Status: DC | PRN

## 2015-05-12 MED ORDER — PHENYLEPHRINE HCL 10 MG/ML IJ SOLN
INTRAMUSCULAR | Status: DC | PRN
  Administered 2015-05-12: 80 ug via INTRAVENOUS
  Administered 2015-05-12: 120 ug via INTRAVENOUS

## 2015-05-12 MED ORDER — ACETAMINOPHEN 325 MG PO TABS: 650.0000 mg | ORAL_TABLET | Freq: Four times a day (QID) | ORAL | Status: DC | PRN

## 2015-05-12 MED ORDER — CEFAZOLIN SODIUM 1-5 GM-% IV SOLN
1.0000 g | Freq: Four times a day (QID) | INTRAVENOUS | Status: AC
  Administered 2015-05-12 (×2): 1 g via INTRAVENOUS
  Filled 2015-05-12 (×2): qty 50

## 2015-05-12 MED ORDER — HYDROMORPHONE HCL 1 MG/ML IJ SOLN
0.2500 mg | INTRAMUSCULAR | Status: DC | PRN
  Administered 2015-05-12 (×4): 0.5 mg via INTRAVENOUS

## 2015-05-12 MED ORDER — SODIUM CHLORIDE 0.9 % IR SOLN
Status: AC
  Filled 2015-05-12: qty 1

## 2015-05-12 MED ORDER — HYDROCHLOROTHIAZIDE 25 MG PO TABS
25.0000 mg | ORAL_TABLET | Freq: Every day | ORAL | Status: DC
  Administered 2015-05-12 – 2015-05-13 (×2): 25 mg via ORAL
  Filled 2015-05-12 (×3): qty 1

## 2015-05-12 MED ORDER — HYDROMORPHONE HCL 1 MG/ML IJ SOLN
INTRAMUSCULAR | Status: AC
  Filled 2015-05-12: qty 1

## 2015-05-12 MED ORDER — THROMBIN 5000 UNITS EX SOLR
CUTANEOUS | Status: DC | PRN
  Administered 2015-05-12: 5000 [IU] via TOPICAL

## 2015-05-12 MED ORDER — CEFAZOLIN SODIUM-DEXTROSE 2-3 GM-% IV SOLR
2.0000 g | INTRAVENOUS | Status: AC
  Administered 2015-05-12: 2 g via INTRAVENOUS

## 2015-05-12 MED ORDER — LACTATED RINGERS IV SOLN
INTRAVENOUS | Status: DC | PRN
  Administered 2015-05-12 (×3): via INTRAVENOUS

## 2015-05-12 MED ORDER — RIVAROXABAN 10 MG PO TABS
10.0000 mg | ORAL_TABLET | Freq: Every day | ORAL | Status: DC
  Administered 2015-05-13 – 2015-05-14 (×2): 10 mg via ORAL
  Filled 2015-05-12 (×3): qty 1

## 2015-05-12 MED ORDER — ONDANSETRON HCL 4 MG/2ML IJ SOLN: 4.0000 mg | Freq: Four times a day (QID) | INTRAMUSCULAR | Status: DC | PRN

## 2015-05-12 MED ORDER — ROCURONIUM BROMIDE 100 MG/10ML IV SOLN
INTRAVENOUS | Status: AC
  Filled 2015-05-12: qty 1

## 2015-05-12 MED ORDER — LEVOTHYROXINE SODIUM 112 MCG PO TABS
112.0000 ug | ORAL_TABLET | Freq: Every day | ORAL | Status: DC
  Administered 2015-05-13 – 2015-05-14 (×2): 112 ug via ORAL
  Filled 2015-05-12 (×3): qty 1

## 2015-05-12 MED ORDER — METOCLOPRAMIDE HCL 10 MG PO TABS: 5.0000 mg | ORAL_TABLET | Freq: Three times a day (TID) | ORAL | Status: DC | PRN

## 2015-05-12 MED ORDER — LIDOCAINE HCL (CARDIAC) 20 MG/ML IV SOLN
INTRAVENOUS | Status: DC | PRN
  Administered 2015-05-12: 50 mg via INTRAVENOUS

## 2015-05-12 MED ORDER — ONDANSETRON HCL 4 MG PO TABS: 4.0000 mg | ORAL_TABLET | Freq: Four times a day (QID) | ORAL | Status: DC | PRN

## 2015-05-12 MED ORDER — PHENOL 1.4 % MT LIQD
1.0000 | OROMUCOSAL | Status: DC | PRN
Start: 2015-05-12 — End: 2015-05-14
  Filled 2015-05-12: qty 177

## 2015-05-12 MED ORDER — OXYCODONE-ACETAMINOPHEN 5-325 MG PO TABS
2.0000 | ORAL_TABLET | ORAL | Status: DC | PRN
  Administered 2015-05-12: 1 via ORAL
  Filled 2015-05-12: qty 2

## 2015-05-12 MED ORDER — HYDROMORPHONE HCL 1 MG/ML IJ SOLN
0.2500 mg | INTRAMUSCULAR | Status: DC | PRN
  Administered 2015-05-12: 0.5 mg via INTRAVENOUS

## 2015-05-12 MED ORDER — PROMETHAZINE HCL 25 MG/ML IJ SOLN: 6.2500 mg | INTRAMUSCULAR | Status: DC | PRN

## 2015-05-12 MED ORDER — MEPERIDINE HCL 50 MG/ML IJ SOLN: 6.2500 mg | INTRAMUSCULAR | Status: DC | PRN

## 2015-05-12 MED ORDER — METOCLOPRAMIDE HCL 5 MG/ML IJ SOLN
5.0000 mg | Freq: Three times a day (TID) | INTRAMUSCULAR | Status: DC | PRN
  Administered 2015-05-12: 10 mg via INTRAVENOUS
  Filled 2015-05-12: qty 2

## 2015-05-12 MED ORDER — POLYETHYLENE GLYCOL 3350 17 G PO PACK: 17.0000 g | PACK | Freq: Every day | ORAL | Status: DC | PRN

## 2015-05-12 MED ORDER — ACETAMINOPHEN 650 MG RE SUPP: 650.0000 mg | Freq: Four times a day (QID) | RECTAL | Status: DC | PRN

## 2015-05-12 MED ORDER — PROPOFOL 10 MG/ML IV BOLUS
INTRAVENOUS | Status: DC | PRN
Start: 2015-05-12 — End: 2015-05-12
  Administered 2015-05-12: 120 mg via INTRAVENOUS

## 2015-05-12 MED ORDER — CHLORHEXIDINE GLUCONATE 4 % EX LIQD: 60.0000 mL | Freq: Once | CUTANEOUS | Status: DC

## 2015-05-12 MED ORDER — FENTANYL CITRATE (PF) 100 MCG/2ML IJ SOLN: 25.0000 ug | INTRAMUSCULAR | Status: DC | PRN

## 2015-05-12 MED ORDER — LACTATED RINGERS IV SOLN: INTRAVENOUS | Status: DC

## 2015-05-12 MED ORDER — DEXAMETHASONE SODIUM PHOSPHATE 10 MG/ML IJ SOLN
INTRAMUSCULAR | Status: AC
  Filled 2015-05-12: qty 1

## 2015-05-12 MED ORDER — TRAZODONE HCL 100 MG PO TABS
100.0000 mg | ORAL_TABLET | Freq: Every day | ORAL | Status: DC
  Administered 2015-05-12 – 2015-05-13 (×2): 100 mg via ORAL
  Filled 2015-05-12: qty 1
  Filled 2015-05-12: qty 2
  Filled 2015-05-12 (×2): qty 1

## 2015-05-12 MED ORDER — BUPIVACAINE LIPOSOME 1.3 % IJ SUSP
20.0000 mL | Freq: Once | INTRAMUSCULAR | Status: AC
  Administered 2015-05-12: 20 mL
  Filled 2015-05-12: qty 20

## 2015-05-12 MED ORDER — SODIUM CHLORIDE 0.9 % IJ SOLN
INTRAMUSCULAR | Status: DC | PRN
  Administered 2015-05-12: 20 mL

## 2015-05-12 MED ORDER — TRANEXAMIC ACID 1000 MG/10ML IV SOLN
1000.0000 mg | INTRAVENOUS | Status: AC
  Administered 2015-05-12: 1000 mg via INTRAVENOUS
  Filled 2015-05-12: qty 10

## 2015-05-12 MED ORDER — ROCURONIUM BROMIDE 100 MG/10ML IV SOLN
INTRAVENOUS | Status: DC | PRN
  Administered 2015-05-12: 15 mg via INTRAVENOUS
  Administered 2015-05-12: 35 mg via INTRAVENOUS

## 2015-05-12 MED ORDER — ONDANSETRON HCL 4 MG/2ML IJ SOLN
INTRAMUSCULAR | Status: AC
  Filled 2015-05-12: qty 2

## 2015-05-12 MED ORDER — LISINOPRIL-HYDROCHLOROTHIAZIDE 20-25 MG PO TABS: 1.0000 | ORAL_TABLET | Freq: Every evening | ORAL | Status: DC

## 2015-05-12 MED ORDER — ONDANSETRON HCL 4 MG/2ML IJ SOLN
INTRAMUSCULAR | Status: DC | PRN
  Administered 2015-05-12: 4 mg via INTRAVENOUS

## 2015-05-12 MED ORDER — HYDROMORPHONE HCL 1 MG/ML IJ SOLN: 1.0000 mg | INTRAMUSCULAR | Status: DC | PRN

## 2015-05-12 MED ORDER — SODIUM CHLORIDE 0.9 % IJ SOLN
INTRAMUSCULAR | Status: AC
  Filled 2015-05-12: qty 50

## 2015-05-12 MED ORDER — THROMBIN 5000 UNITS EX SOLR
CUTANEOUS | Status: AC
  Filled 2015-05-12: qty 5000

## 2015-05-12 MED ORDER — GLYCOPYRROLATE 0.2 MG/ML IJ SOLN
INTRAMUSCULAR | Status: DC | PRN
  Administered 2015-05-12: 0.4 mg via INTRAVENOUS

## 2015-05-12 MED ORDER — HYDROCODONE-ACETAMINOPHEN 5-325 MG PO TABS
1.0000 | ORAL_TABLET | ORAL | Status: DC | PRN
  Administered 2015-05-12 – 2015-05-14 (×5): 1 via ORAL
  Filled 2015-05-12 (×6): qty 1

## 2015-05-12 MED ORDER — NEOSTIGMINE METHYLSULFATE 10 MG/10ML IV SOLN
INTRAVENOUS | Status: DC | PRN
  Administered 2015-05-12: 3 mg via INTRAVENOUS

## 2015-05-12 MED ORDER — LACTATED RINGERS IV SOLN
INTRAVENOUS | Status: DC
  Administered 2015-05-12: 100 mL/h via INTRAVENOUS
  Administered 2015-05-13 (×2): via INTRAVENOUS

## 2015-05-12 MED ORDER — POLYMYXIN B SULFATE 500000 UNITS IJ SOLR
INTRAMUSCULAR | Status: DC | PRN
  Administered 2015-05-12: 500 mL

## 2015-05-12 MED ORDER — FERROUS SULFATE 325 (65 FE) MG PO TABS
325.0000 mg | ORAL_TABLET | Freq: Three times a day (TID) | ORAL | Status: DC
  Administered 2015-05-13 – 2015-05-14 (×3): 325 mg via ORAL
  Filled 2015-05-12 (×8): qty 1

## 2015-05-12 MED ORDER — FLEET ENEMA 7-19 GM/118ML RE ENEM: 1.0000 | ENEMA | Freq: Once | RECTAL | Status: DC | PRN

## 2015-05-12 MED ORDER — LISINOPRIL 20 MG PO TABS
20.0000 mg | ORAL_TABLET | Freq: Every day | ORAL | Status: DC
  Administered 2015-05-12 – 2015-05-13 (×2): 20 mg via ORAL
  Filled 2015-05-12 (×3): qty 1

## 2015-05-12 SURGICAL SUPPLY — 58 items
BAG ZIPLOCK 12X15 (MISCELLANEOUS) IMPLANT
BLADE SAW SAG 73X25 THK (BLADE) ×1
BLADE SAW SGTL 73X25 THK (BLADE) ×1 IMPLANT
CAPT HIP TOTAL 2 ×2 IMPLANT
DRAPE INCISE IOBAN 85X60 (DRAPES) ×2 IMPLANT
DRAPE ORTHO SPLIT 77X108 STRL (DRAPES) ×2
DRAPE POUCH INSTRU U-SHP 10X18 (DRAPES) ×2 IMPLANT
DRAPE SURG 17X11 SM STRL (DRAPES) ×2 IMPLANT
DRAPE SURG ORHT 6 SPLT 77X108 (DRAPES) ×2 IMPLANT
DRAPE U-SHAPE 47X51 STRL (DRAPES) ×2 IMPLANT
DRSG ADAPTIC 3X8 NADH LF (GAUZE/BANDAGES/DRESSINGS) IMPLANT
DRSG AQUACEL AG ADV 3.5X10 (GAUZE/BANDAGES/DRESSINGS) ×2 IMPLANT
DRSG AQUACEL AG ADV 3.5X14 (GAUZE/BANDAGES/DRESSINGS) IMPLANT
DRSG TEGADERM 4X4.75 (GAUZE/BANDAGES/DRESSINGS) IMPLANT
DURAPREP 26ML APPLICATOR (WOUND CARE) ×2 IMPLANT
ELECT BLADE TIP CTD 4 INCH (ELECTRODE) ×2 IMPLANT
ELECT REM PT RETURN 9FT ADLT (ELECTROSURGICAL) ×2
ELECTRODE REM PT RTRN 9FT ADLT (ELECTROSURGICAL) ×1 IMPLANT
EVACUATOR 1/8 PVC DRAIN (DRAIN) IMPLANT
FACESHIELD WRAPAROUND (MASK) ×8 IMPLANT
GAUZE SPONGE 2X2 8PLY STRL LF (GAUZE/BANDAGES/DRESSINGS) IMPLANT
GAUZE SPONGE 4X4 12PLY STRL (GAUZE/BANDAGES/DRESSINGS) IMPLANT
GLOVE BIOGEL PI IND STRL 6.5 (GLOVE) ×1 IMPLANT
GLOVE BIOGEL PI IND STRL 8 (GLOVE) ×1 IMPLANT
GLOVE BIOGEL PI INDICATOR 6.5 (GLOVE) ×1
GLOVE BIOGEL PI INDICATOR 8 (GLOVE) ×1
GLOVE ECLIPSE 8.0 STRL XLNG CF (GLOVE) ×4 IMPLANT
GLOVE SURG SS PI 6.5 STRL IVOR (GLOVE) IMPLANT
GOWN STRL REUS W/TWL LRG LVL3 (GOWN DISPOSABLE) ×2 IMPLANT
GOWN STRL REUS W/TWL XL LVL3 (GOWN DISPOSABLE) ×2 IMPLANT
IMMOBILIZER KNEE 20 (SOFTGOODS) ×2 IMPLANT
IMMOBILIZER KNEE 20 THIGH 36 (SOFTGOODS) IMPLANT
KIT BASIN OR (CUSTOM PROCEDURE TRAY) ×2 IMPLANT
LIQUID BAND (GAUZE/BANDAGES/DRESSINGS) ×2 IMPLANT
MANIFOLD NEPTUNE II (INSTRUMENTS) ×2 IMPLANT
NDL SAFETY ECLIPSE 18X1.5 (NEEDLE) ×1 IMPLANT
NEEDLE HYPO 18GX1.5 SHARP (NEEDLE) ×1
PACK TOTAL JOINT (CUSTOM PROCEDURE TRAY) ×2 IMPLANT
PEN SKIN MARKING BROAD (MISCELLANEOUS) ×2 IMPLANT
POSITIONER SURGICAL ARM (MISCELLANEOUS) ×2 IMPLANT
SPONGE GAUZE 2X2 STER 10/PKG (GAUZE/BANDAGES/DRESSINGS)
SPONGE LAP 18X18 X RAY DECT (DISPOSABLE) IMPLANT
SPONGE LAP 4X18 X RAY DECT (DISPOSABLE) ×2 IMPLANT
SPONGE SURGIFOAM ABS GEL 100 (HEMOSTASIS) ×2 IMPLANT
STAPLER VISISTAT 35W (STAPLE) IMPLANT
SUCTION FRAZIER TIP 10 FR DISP (SUCTIONS) ×2 IMPLANT
SUT MNCRL AB 4-0 PS2 18 (SUTURE) IMPLANT
SUT VIC AB 1 CT1 27 (SUTURE) ×1
SUT VIC AB 1 CT1 27XBRD ANTBC (SUTURE) ×1 IMPLANT
SUT VIC AB 2-0 CT1 27 (SUTURE) ×3
SUT VIC AB 2-0 CT1 TAPERPNT 27 (SUTURE) ×3 IMPLANT
SUT VLOC 180 0 24IN GS25 (SUTURE) ×2 IMPLANT
SYR 20CC LL (SYRINGE) ×2 IMPLANT
TOWEL OR 17X26 10 PK STRL BLUE (TOWEL DISPOSABLE) ×4 IMPLANT
TRAY FOLEY W/METER SILVER 14FR (SET/KITS/TRAYS/PACK) ×2 IMPLANT
TRAY FOLEY W/METER SILVER 16FR (SET/KITS/TRAYS/PACK) IMPLANT
WATER STERILE IRR 1500ML POUR (IV SOLUTION) ×2 IMPLANT
YANKAUER SUCT BULB TIP 10FT TU (MISCELLANEOUS) ×2 IMPLANT

## 2015-05-12 NOTE — Anesthesia Procedure Notes (Signed)
Procedure Name: Intubation Performed by: Phila Shoaf J Pre-anesthesia Checklist: Patient identified, Emergency Drugs available, Suction available, Patient being monitored and Timeout performed Patient Re-evaluated:Patient Re-evaluated prior to inductionOxygen Delivery Method: Circle system utilized Preoxygenation: Pre-oxygenation with 100% oxygen Intubation Type: IV induction Ventilation: Mask ventilation without difficulty Laryngoscope Size: Mac and 3 Grade View: Grade I Tube type: Oral Tube size: 7.0 mm Number of attempts: 1 Airway Equipment and Method: Stylet Placement Confirmation: ETT inserted through vocal cords under direct vision,  positive ETCO2,  CO2 detector and breath sounds checked- equal and bilateral Secured at: 21 cm Tube secured with: Tape Dental Injury: Teeth and Oropharynx as per pre-operative assessment        

## 2015-05-12 NOTE — Transfer of Care (Signed)
Immediate Anesthesia Transfer of Care Note  Patient: [name]  Procedure(s) Performed: Procedure(s): RIGHT TOTAL HIP ARTHROPLASTY (Right)  Patient Location: PACU  Anesthesia Type:General  Level of Consciousness: sedated, patient cooperative and responds to stimulation  Airway & Oxygen Therapy: Patient Spontanous Breathing and Patient connected to face mask oxygen  Post-op Assessment: Report given to RN and Post -op Vital signs reviewed and stable  Post vital signs: Reviewed and stable  Last Vitals:  Filed Vitals:   05/12/15 0907  BP: 160/76  Pulse: 70  Temp: 36.4 C  Resp: 18    Complications: No apparent anesthesia complications

## 2015-05-12 NOTE — Progress Notes (Signed)
X-RAY results noted 

## 2015-05-12 NOTE — Anesthesia Preprocedure Evaluation (Signed)
Anesthesia Evaluation  Patient identified by MRN, date of birth, ID band Patient awake    Reviewed: Allergy & Precautions, NPO status , Patient's Chart, lab work & pertinent test results  Airway Mallampati: II  TM Distance: >3 FB Neck ROM: Full    Dental no notable dental hx.    Pulmonary neg pulmonary ROS,  breath sounds clear to auscultation  Pulmonary exam normal       Cardiovascular hypertension, Pt. on medications Normal cardiovascular examRhythm:Regular Rate:Normal     Neuro/Psych negative neurological ROS  negative psych ROS   GI/Hepatic negative GI ROS, Neg liver ROS,   Endo/Other  negative endocrine ROS  Renal/GU negative Renal ROS  negative genitourinary   Musculoskeletal negative musculoskeletal ROS (+)   Abdominal   Peds negative pediatric ROS (+)  Hematology negative hematology ROS (+)   Anesthesia Other Findings   Reproductive/Obstetrics negative OB ROS                             Anesthesia Physical Anesthesia Plan  ASA: II  Anesthesia Plan: General   Post-op Pain Management:    Induction: Intravenous  Airway Management Planned: Oral ETT  Additional Equipment:   Intra-op Plan:   Post-operative Plan: Extubation in OR  Informed Consent: I have reviewed the patients History and Physical, chart, labs and discussed the procedure including the risks, benefits and alternatives for the proposed anesthesia with the patient or authorized representative who has indicated his/her understanding and acceptance.   Dental advisory given  Plan Discussed with: CRNA  Anesthesia Plan Comments:         Anesthesia Quick Evaluation  

## 2015-05-12 NOTE — Progress Notes (Signed)
Utilization review completed.  

## 2015-05-12 NOTE — Interval H&P Note (Signed)
History and Physical Interval Note:  05/12/2015 10:54 AM  [name]  has presented today for surgery, with the diagnosis of OA RIGHT HIP  The various methods of treatment have been discussed with the patient and family. After consideration of risks, benefits and other options for treatment, the patient has consented to  Procedure(s): RIGHT TOTAL HIP ARTHROPLASTY (Right) as a surgical intervention .  The patient's history has been reviewed, patient examined, no change in status, stable for surgery.  I have reviewed the patient's chart and labs.  Questions were answered to the patient's satisfaction.     Joanette Silveria A

## 2015-05-12 NOTE — Brief Op Note (Signed)
05/12/2015  12:58 PM  PATIENT:  [name]  81 y.o. female  PRE-OPERATIVE DIAGNOSIS:Primary OA RIGHT HIP  POST-OPERATIVE DIAGNOSIS:  PRIMARY OA RIGHT HIP  PROCEDURE:  Procedure(s): RIGHT TOTAL HIP ARTHROPLASTY (Right)  SURGEON:  Surgeon(s) and Role:    * Ranee Gosselin, MD - Primary  PHYSICIAN ASSISTANT:Amber Littlefork PA   ASSISTANTS: Dimitri Ped PA   ANESTHESIA:   general  EBL:  Total I/O In: 1000 [I.V.:1000] Out: 500 [Blood:500]  BLOOD ADMINISTERED:none  DRAINS: none   LOCAL MEDICATIONS USED:  Exparel20cc mixed with 20cc of Normal Saline.    SPECIMEN:  No Specimen  DISPOSITION OF SPECIMEN:  N/A  COUNTS:  YES  TOURNIQUET:  * No tourniquets in log *  DICTATION: .Other Dictation: Dictation Number [id number]  PLAN OF CARE: Admit to inpatient   PATIENT DISPOSITION:  Stable in OR   Delay start of Pharmacological VTE agent (>24hrs) due to surgical blood loss or risk of bleeding: yes

## 2015-05-12 NOTE — Progress Notes (Signed)
Portable AP Right Hip X-RAY done.

## 2015-05-13 ENCOUNTER — Encounter (HOSPITAL_COMMUNITY): Payer: Self-pay | Admitting: Orthopedic Surgery

## 2015-05-13 LAB — CBC
HEMATOCRIT: 26.6 % — AB (ref 36.0–46.0)
Hemoglobin: 9.2 g/dL — ABNORMAL LOW (ref 12.0–15.0)
MCH: 28.8 pg (ref 26.0–34.0)
MCHC: 34.6 g/dL (ref 30.0–36.0)
MCV: 83.1 fL (ref 78.0–100.0)
PLATELETS: 203 10*3/uL (ref 150–400)
RBC: 3.2 MIL/uL — ABNORMAL LOW (ref 3.87–5.11)
RDW: 12.4 % (ref 11.5–15.5)
WBC: 9.6 10*3/uL (ref 4.0–10.5)

## 2015-05-13 LAB — BASIC METABOLIC PANEL
Anion gap: 7 (ref 5–15)
BUN: 12 mg/dL (ref 6–20)
CALCIUM: 8.3 mg/dL — AB (ref 8.9–10.3)
CO2: 27 mmol/L (ref 22–32)
CREATININE: 0.84 mg/dL (ref 0.44–1.00)
Chloride: 97 mmol/L — ABNORMAL LOW (ref 101–111)
GFR calc Af Amer: >60 mL/min (ref >60)
GFR calc non Af Amer: >60 mL/min (ref >60)
GLUCOSE: 144 mg/dL — AB (ref 65–99)
POTASSIUM: 3.1 mmol/L — AB (ref 3.5–5.1)
Sodium: 131 mmol/L — ABNORMAL LOW (ref 135–145)

## 2015-05-13 NOTE — Care Management Note (Signed)
Case Management Note  Patient Details  Name: TALLI KIMMER MRN: [id number] Date of Birth: 01-22-1933  Subjective/Objective:                   right total hip arthroplasty. Action/Plan:  Discharge planning Expected Discharge Date:  unknown               Expected Discharge Plan:  Skilled Nursing Facility  In-House Referral:     Discharge planning Services  CM Consult  Post Acute Care Choice:    Choice offered to:     DME Arranged:    DME Agency:     HH Arranged:    HH Agency:     Status of Service:     Medicare Important Message Given:    Date Medicare IM Given:    Medicare IM give by:    Date Additional Medicare IM Given:    Additional Medicare Important Message give by:     If discussed at Long Length of Stay Meetings, dates discussed:    Additional Comments: CM notes pt to go to SNF for rehab; CSW arranging.  No other CM needs were communicated. Yves Dill, RN 05/13/2015, 10:49 AM

## 2015-05-13 NOTE — Evaluation (Signed)
Physical Therapy Evaluation Patient Details Name: [name] MRN: [id number] DOB: 10-05-1932 Today's Date: 05/13/2015   History of Present Illness  s/p R posterior approach THA  Clinical Impression  Pt s/p R THR presents with decreased R LE strength/ROM and post op pain limiting functional mobility.  Pt would benefit from follow up rehab at SNF level to maximize IND and safety prior to return home with ltd assist.    Follow Up Recommendations SNF    Equipment Recommendations  None recommended by PT    Recommendations for Other Services OT consult     Precautions / Restrictions Precautions Precautions: Posterior Hip Precaution Booklet Issued: Yes (comment) Precaution Comments: sign hung in room Restrictions Weight Bearing Restrictions: Yes RLE Weight Bearing: Partial weight bearing RLE Partial Weight Bearing Percentage or Pounds: 50% Other Position/Activity Restrictions: PWB 50%      Mobility  Bed Mobility Overal bed mobility: Needs Assistance Bed Mobility: Supine to Sit     Supine to sit: Min assist;+2 for physical assistance     General bed mobility comments: cues for sequence; assist for RLE and trunk  Transfers Overall transfer level: Needs assistance Equipment used: Rolling walker (2 wheeled) Transfers: Sit to/from Stand Sit to Stand: Min assist;+2 safety/equipment;From elevated surface         General transfer comment: cues for UE/LE placement  Ambulation/Gait Ambulation/Gait assistance: Min assist;+2 physical assistance;+2 safety/equipment Ambulation Distance (Feet): 13 Feet Assistive device: Rolling walker (2 wheeled) Gait Pattern/deviations: Step-to pattern;Decreased step length - left;Shuffle;Trunk flexed     General Gait Details: cues for sequence, posture, position from RW and [facility]  Stairs            Wheelchair Mobility    Modified Rankin (Stroke Patients Only)       Balance                                              Pertinent Vitals/Pain Pain Assessment: 0-10 Pain Score: 5  Pain Location: R hip Pain Descriptors / Indicators: Sore Pain Intervention(s): Limited activity within patient's tolerance;Monitored during session;Premedicated before session;Repositioned;Ice applied    Home Living Family/patient expects to be discharged to:: Skilled nursing facility Living Arrangements: Alone;Children                    Prior Function Level of Independence: Independent               Hand Dominance        Extremity/Trunk Assessment   Upper Extremity Assessment: Overall WFL for tasks assessed (has arthritis in L shoulder; hurts to lift beyond 90)           Lower Extremity Assessment: RLE deficits/detail RLE Deficits / Details: 2+/5 strength at hip wtih AAROM at hip to 75 flex and 15 abd    Cervical / Trunk Assessment: Normal  Communication   Communication: No difficulties  Cognition Arousal/Alertness: Awake/alert Behavior During Therapy: WFL for tasks assessed/performed Overall Cognitive Status: Within Functional Limits for tasks assessed                      General Comments      Exercises Total Joint Exercises Ankle Circles/Pumps: AROM;Both;15 reps;Supine Quad Sets: AROM;Both;10 reps;Supine Heel Slides: AAROM;Right;15 reps;Supine Hip ABduction/ADduction: AAROM;Right;10 reps;Supine      Assessment/Plan    PT Assessment Patient needs continued PT services  PT Diagnosis Difficulty walking   PT Problem List Decreased strength;Decreased range of motion;Decreased activity tolerance;Decreased mobility;Decreased knowledge of use of DME;Pain;Decreased knowledge of precautions  PT Treatment Interventions DME instruction;Gait training;Functional mobility training;Therapeutic activities;Therapeutic exercise;Patient/family education   PT Goals (Current goals can be found in the Care Plan section) Acute Rehab PT Goals Patient Stated Goal: get back to being  independent PT Goal Formulation: With patient Time For Goal Achievement: 05/17/15 Potential to Achieve Goals: Good    Frequency 7X/week   Barriers to discharge        Co-evaluation   Reason for Co-Treatment: For patient/therapist safety PT goals addressed during session: Mobility/safety with mobility OT goals addressed during session: ADL's and self-care       End of Session Equipment Utilized During Treatment: Gait belt Activity Tolerance: Other (comment);Patient tolerated treatment well (dizziness with ambulation) Patient left: in chair;with call bell/phone within reach Nurse Communication: Mobility status         Time: 5784-6962 PT Time Calculation (min) (ACUTE ONLY): 34 min   Charges:   PT Evaluation $Initial PT Evaluation Tier I: 1 Procedure     PT G Codes:        [name] 06-07-2015, 10:25 AM

## 2015-05-13 NOTE — Progress Notes (Signed)
Physical Therapy Treatment Patient Details Name: [name] MRN: [id number] DOB: [birth year] Today's Date: 05/13/2015    History of Present Illness s/p R posterior approach THA    PT Comments    Pt very cooperative but with increased anxiety re: WB and attempting to ambulate NWB.  Reinforced PWB allowance.  Follow Up Recommendations  SNF     Equipment Recommendations  None recommended by PT    Recommendations for Other Services OT consult     Precautions / Restrictions Precautions Precautions: Posterior Hip Precaution Booklet Issued: Yes (comment) Precaution Comments: Pt recalls 2/3 THR Restrictions Weight Bearing Restrictions: Yes RLE Weight Bearing: Partial weight bearing RLE Partial Weight Bearing Percentage or Pounds: 50% Other Position/Activity Restrictions: PWB 50%    Mobility  Bed Mobility Overal bed mobility: Needs Assistance Bed Mobility: Sit to Supine     Supine to sit: Min assist;+2 for physical assistance Sit to supine: Min assist;Mod assist;+2 for physical assistance;+2 for safety/equipment   General bed mobility comments: cues for sequence; assist for RLE and trunk  Transfers Overall transfer level: Needs assistance Equipment used: Rolling walker (2 wheeled) Transfers: Sit to/from Stand Sit to Stand: Min assist;+2 safety/equipment         General transfer comment: cues for UE/LE placement and adherence to THP  Ambulation/Gait Ambulation/Gait assistance: Min assist;Mod assist;+2 safety/equipment Ambulation Distance (Feet): 6 Feet Assistive device: Rolling walker (2 wheeled) Gait Pattern/deviations: Step-to pattern;Decreased step length - right;Decreased step length - left;Shuffle;Trunk flexed     General Gait Details: cues for sequence, posture, position from RW and PWB (Pt attempting NWB)   Stairs            Wheelchair Mobility    Modified Rankin (Stroke Patients Only)       Balance                                     Cognition Arousal/Alertness: Awake/alert Behavior During Therapy: WFL for tasks assessed/performed Overall Cognitive Status: Within Functional Limits for tasks assessed                      Exercises Total Joint Exercises Ankle Circles/Pumps: AROM;Both;15 reps;Supine Quad Sets: AROM;Both;10 reps;Supine Heel Slides: AAROM;Right;15 reps;Supine Hip ABduction/ADduction: AAROM;Right;10 reps;Supine    General Comments        Pertinent Vitals/Pain Pain Assessment: 0-10 Pain Score: 5  Pain Location: R hip Pain Descriptors / Indicators: Aching;Sore Pain Intervention(s): Limited activity within patient's tolerance;Monitored during session;Premedicated before session;Ice applied    Home Living Family/patient expects to be discharged to:: Skilled nursing facility Living Arrangements: Alone;Children                  Prior Function Level of Independence: Independent          PT Goals (current goals can now be found in the care plan section) Acute Rehab PT Goals Patient Stated Goal: get back to being independent PT Goal Formulation: With patient Time For Goal Achievement: 05/17/15 Potential to Achieve Goals: Good Progress towards PT goals: Progressing toward goals    Frequency  7X/week    PT Plan Current plan remains appropriate    Co-evaluation   Reason for Co-Treatment: For patient/therapist safety PT goals addressed during session: Mobility/safety with mobility OT goals addressed during session: ADL's and self-care     End of Session Equipment Utilized During Treatment: Gait belt Activity Tolerance: Patient tolerated treatment well  Patient left: in bed;with call bell/phone within reach     Time: 1050-1107 PT Time Calculation (min) (ACUTE ONLY): 17 min  Charges:  $Gait Training: 8-22 mins                    G Codes:      Waylin Dorko 2015/05/18, 12:45 PM

## 2015-05-13 NOTE — Op Note (Signed)
NAMEHETVI, SHAWHAN             ACCOUNT NO.:  [id number]  MEDICAL RECORD NO.:  [id number]  LOCATION:  1606                         FACILITY:  [facility]  PHYSICIAN:  Georges Lynch. Tata Timmins, M.D.DATE OF BIRTH:  01/08/1933  DATE OF PROCEDURE:  05/12/2015 DATE OF DISCHARGE:                              OPERATIVE REPORT   SURGEON:  Georges Lynch. Darrelyn Hillock, M.D.  ASSISTANT:  Dimitri Ped, P.A.  PREOPERATIVE DIAGNOSIS:  Severe bone on bone osteoarthritis, primary type, of the right hip.  POSTOPERATIVE DIAGNOSIS:  Severe bone on bone osteoarthritis, primary type, of the right hip.  OPERATION:  Right total hip arthroplasty.  The DePuy system was used.  I used a size 52 mm pinnacle cup with a hole eliminator.  No screw was necessary because the cup was extremely tight in the acetabulum.  The size of the femoral component was a size 6 Tri-Lock stem with a 1.5 mm length neck and a 36 mm diameter ceramic ball.  The liner was a 36 mm inside diameter polyethylene liner for the cup.  DESCRIPTION OF PROCEDURE:  Under general anesthesia, the patient on the left side, right side up, routine orthopedic prepping and draping of the right hip was carried out.  The appropriate time-out was carried out.  I also marked the appropriate right hip in the holding area.  She had 2 g of IV Ancef preop.  Posterolateral approach to the hip was carried out. Bleeders were identified and cauterized.  Following that, I then inserted self-retaining retractors.  I incised the iliotibial band. Great care was taken not to injure the sciatic nerve.  Following that, I went down and then partially detached the external rotators and the piriformis was also detached.  I then did an extensive capsulectomy. The capsule was extremely thickened and ingrown toward the acetabulum. Following that, I first amputated the femoral head in usual fashion.  I then utilized the box osteotome and then the widening reamer.  Following that, I used  the canal finder and then thoroughly irrigated out the canal.  I then rasped the canal up to a size 6 Tri-Lock stem.  Following that, after the appropriate rasping, I irrigated the canal and then packed the canal with a large sponge and then later removed that. Attention now was directed to the acetabulum.  As I mentioned above, we did an extensive capsulectomy.  Following that, we then reamed the acetabulum up to a size 51 for a 52 mm cup.  We utilized after we inserted our pinnacle cup.  I first used the Charnley guide to make sure we had good alignment.  The cup was solid, we could not abut that cup once it was in, so I did not use a screw for fixation.  I then inserted my trial liner and went through the trials and finally selected a size 6 Tri-Lock stem with a + 1.5 neck.  I utilized the high-offset stem.  At that time, trial components were removed.  I then inserted my hole eliminator and then I inserted my permanent polyethylene liner 36 mm diameter.  Once this was done, I then inserted my trials, went through trials again with the liner in place, and then  finally inserted my permanent high-offset Tri-Lock stem size 6.  I did place the stem in some anteversion.  At this particular time, we went through trials again.  I finally selected a +1.5 ceramic head, reduced the hip, and had excellent function in all ranges.  At this particular time, great care once again was taken to make sure we did not injure the sciatic nerve.  We took the hip through motion as I mentioned.  We thoroughly irrigated out the hip and then I inserted some thrombin-soaked Gelfoam and then injected 20 mL of Exparel mixed with 20 mL of normal saline. The wound was closed in layers in usual fashion.          ______________________________ Georges Lynch Darrelyn Hillock, M.D.     RAG/MEDQ  D:  05/12/2015  T:  05/13/2015  Job:  [id number]

## 2015-05-13 NOTE — Progress Notes (Signed)
   Subjective: 1 Day Post-Op Procedure(s) (LRB): RIGHT TOTAL HIP ARTHROPLASTY (Right) Patient reports pain as moderate.   Patient seen in rounds with Dr. Darrelyn Hillock. Patient is well, and has had no acute complaints or problems. No issues overnight. She reports that she is feeling a little groggy this morning. No SOB or chest pain. Reports that she is motivated to work with PT this morning.  Plan is to go Skilled nursing facility after hospital stay.  Objective: Vital signs in last 24 hours: Temp:  [97.3 F (36.3 C)-98.2 F (36.8 C)] 98.2 F (36.8 C) (08/11 0539) Pulse Rate:  [53-93] 79 (08/11 0539) Resp:  [10-20] 16 (08/11 0539) BP: (120-203)/(49-92) 135/49 mmHg (08/11 0539) SpO2:  [96 %-100 %] 100 % (08/11 0539) Weight:  [68.947 kg (152 lb)] 68.947 kg (152 lb) (08/10 1549)  Intake/Output from previous day:  Intake/Output Summary (Last 24 hours) at 05/13/15 0714 Last data filed at 05/13/15 0553  Gross per 24 hour  Intake 4773.34 ml  Output   2400 ml  Net 2373.34 ml     Labs:  Recent Labs  05/13/15 0443  HGB 9.2*    Recent Labs  05/13/15 0443  WBC 9.6  RBC 3.20*  HCT 26.6*  PLT 203    Recent Labs  05/13/15 0443  NA 131*  K 3.1*  CL 97*  CO2 27  BUN 12  CREATININE 0.84  GLUCOSE 144*  CALCIUM 8.3*    EXAM General - Patient is Alert and Oriented Extremity - Neurologically intact Neurovascular intact Dorsiflexion/Plantar flexion intact No cellulitis present Compartment soft Dressing/Incision - clean, dry, no drainage Motor Function - intact, moving foot and toes well on exam.   Past Medical History  Diagnosis Date  . Hypertension   . Arthritis   . Thyroid disease     Assessment/Plan: 1 Day Post-Op Procedure(s) (LRB): RIGHT TOTAL HIP ARTHROPLASTY (Right) Active Problems:   H/O total hip arthroplasty  Estimated body mass index is 25.29 kg/(m^2) as calculated from the following:   Height as of this encounter:  (1.651 m).   Weight as of  this encounter: 68.947 kg (152 lb). Advance diet Up with therapy D/C IV fluids when tolerating POs well  DVT Prophylaxis - Xarelto PWB 50% right leg  She is doing fair this morning. Will continue PT today. Plan for DC to SNF tomorrow.   Dimitri Ped, PA-C Orthopaedic Surgery 05/13/2015, 7:14 AM

## 2015-05-13 NOTE — Clinical Social Work Note (Signed)
Clinical Social Work Assessment  Patient Details  Name: [name] MRN: [id number] Date of Birth: [birth year]  Date of referral:  05/13/15               Reason for consult:  Facility Placement, Discharge Planning                Permission sought to share information with:  Chartered certified accountant granted to share information::  Yes, Verbal Permission Granted  Name::        Agency::     Relationship::     Contact Information:     Housing/Transportation Living arrangements for the past 2 months:  Single Family Home Source of Information:  Patient, Adult Children Patient Interpreter Needed:  None Criminal Activity/Legal Involvement Pertinent to Current Situation/Hospitalization:  No - Comment as needed Significant Relationships:  Adult Children, Other Family Members Lives with:  Self Do you feel safe going back to the place where you live?   (ST Rehab needed.) Need for family participation in patient care:  Yes (Comment)  Care giving concerns: Pt's care cannot be managed at home following hospital d/c.   Social Worker assessment / plan: Pt hospitalized on 05/12/15 for pre planned right total hip arthroplasty. CSW met with pt / family to assist with d/c planning. ST Rehab will most likely be needed at d/c. Pt and family support this plan. CSW has initiated SNF search and will meet with pt / family to provide bed offers this am. Pt has requested [facility]. CSW has contacted [facility] and a decision is pending.   Employment status:  Retired Nurse, adult PT Recommendations:  Not assessed at this time Information / Referral to community resources:  [place]  Patient/Family's Response to care:  Pt / family feel ST Rehab is needed.  Patient/Family's Understanding of and Emotional Response to Diagnosis, Current Treatment, and Prognosis:  Pt / family have a good understanding of pt's medical status. Pt is motivated to  begin therapy. Pt / family are hopefull [facility] will have a rehab opening.  Emotional Assessment Appearance:  Appears stated age Attitude/Demeanor/Rapport:  Other (cooperative) Affect (typically observed):  Calm, Pleasant Orientation:    Alcohol / Substance use:  Not Applicable Psych involvement (Current and /or in the community):  No (Comment)  Discharge Needs  Concerns to be addressed:  Discharge Planning Concerns Readmission within the last 30 days:  No Current discharge risk:  None Barriers to Discharge:      Loraine Maple  [phone] 05/13/2015, 9:18 AM

## 2015-05-13 NOTE — Discharge Instructions (Addendum)
INSTRUCTIONS AFTER JOINT REPLACEMENT   Remove items at home which could result in a fall. This includes throw rugs or furniture in walking pathways ICE to the affected joint every three hours while awake for 30 minutes at a time, for at least the first 3-5 days, and then as needed for pain and swelling.  Continue to use ice for pain and swelling. You may notice swelling that will progress down to the foot and ankle.  This is normal after surgery.  Elevate your leg when you are not up walking on it.   Continue to use the breathing machine you got in the hospital (incentive spirometer) which will help keep your temperature down.  It is common for your temperature to cycle up and down following surgery, especially at night when you are not up moving around and exerting yourself.  The breathing machine keeps your lungs expanded and your temperature down.   DIET:  As you were doing prior to hospitalization, we recommend a well-balanced diet.  DRESSING / WOUND CARE / SHOWERING  Keep the surgical dressing until follow up.  The dressing is water proof, so you can shower without any extra covering.  IF THE DRESSING FALLS OFF or the wound gets wet inside, change the dressing with sterile gauze.  Please use good hand washing techniques before changing the dressing.  Do not use any lotions or creams on the incision until instructed by your surgeon.    ACTIVITY  Increase activity slowly as tolerated, but follow the weight bearing instructions below.   No driving for 6 weeks or until further direction given by your physician.  You cannot drive while taking narcotics.  No lifting or carrying greater than 10 lbs. until further directed by your surgeon. Avoid periods of inactivity such as sitting longer than an hour when not asleep. This helps prevent blood clots.  You may return to work once you are authorized by your doctor.     WEIGHT BEARING   Partial weight bearing with assist device 50% right leg.  You  may be weightbearing as tolerated one week postop.   EXERCISES  Results after joint replacement surgery are often greatly improved when you follow the exercise, range of motion and muscle strengthening exercises prescribed by your doctor. Safety measures are also important to protect the joint from further injury. Any time any of these exercises cause you to have increased pain or swelling, decrease what you are doing until you are comfortable again and then slowly increase them. If you have problems or questions, call your caregiver or physical therapist for advice.   Rehabilitation is important following a joint replacement. After just a few days of immobilization, the muscles of the leg can become weakened and shrink (atrophy).  These exercises are designed to build up the tone and strength of the thigh and leg muscles and to improve motion. Often times heat used for twenty to thirty minutes before working out will loosen up your tissues and help with improving the range of motion but do not use heat for the first two weeks following surgery (sometimes heat can increase post-operative swelling).    A rehabilitation program following joint replacement surgery can speed recovery and prevent re-injury in the future due to weakened muscles. Contact your doctor or a physical therapist for more information on knee rehabilitation.    CONSTIPATION  Constipation is defined medically as fewer than three stools per week and severe constipation as less than one stool per week.  Even if you have a regular bowel pattern at home, your normal regimen is likely to be disrupted due to multiple reasons following surgery.  Combination of anesthesia, postoperative narcotics, change in appetite and fluid intake all can affect your bowels.   YOU MUST use at least one of the following options; they are listed in order of increasing strength to get the job done.  They are all available over the counter, and you may need  to use some, POSSIBLY even all of these options:    Drink plenty of fluids (prune juice may be helpful) and high fiber foods Colace 100 mg by mouth twice a day  Senokot for constipation as directed and as needed Dulcolax (bisacodyl), take with full glass of water  Miralax (polyethylene glycol) once or twice a day as needed.  If you have tried all these things and are unable to have a bowel movement in the first 3-4 days after surgery call either your surgeon or your primary doctor.    If you experience loose stools or diarrhea, hold the medications until you stool forms back up.  If your symptoms do not get better within 1 week or if they get worse, check with your doctor.  If you experience "the worst abdominal pain ever" or develop nausea or vomiting, please contact the office immediately for further recommendations for treatment.   ITCHING:  If you experience itching with your medications, try taking only a single pain pill, or even half a pain pill at a time.  You can also use Benadryl over the counter for itching or also to help with sleep.   TED HOSE STOCKINGS:  Use stockings on both legs until for at least 2 weeks or as directed by physician office. They may be removed at night for sleeping.  MEDICATIONS:  See your medication summary on the After Visit Summary that nursing will review with you.  You may have some home medications which will be placed on hold until you complete the course of blood thinner medication.  It is important for you to complete the blood thinner medication as prescribed.  PRECAUTIONS:  If you experience chest pain or shortness of breath - call 911 immediately for transfer to the hospital emergency department.   If you develop a fever greater that 101 F, purulent drainage from wound, increased redness or drainage from wound, foul odor from the wound/dressing, or calf pain - CONTACT YOUR SURGEON.                                                   FOLLOW-UP  APPOINTMENTS:  If you do not already have a post-op appointment, please call the office for an appointment to be seen by your surgeon.  Guidelines for how soon to be seen are listed in your After Visit Summary, but are typically between 1-4 weeks after surgery.  OTHER INSTRUCTIONS: Do not take vitamins or supplements while taking Xarelto (blood thinner)   MAKE SURE YOU:  Understand these instructions.  Get help right away if you are not doing well or get worse.    Thank you for letting us be a part of your medical care team.  It is a privilege we respect greatly.  We hope these instructions will help you stay on track for a fast and full recovery!  Information on my  medicine - XARELTO (Rivaroxaban)  This medication education was reviewed with me or my healthcare representative as part of my discharge preparation.  The pharmacist that spoke with me during my hospital stay was:  WOFFORD, DREW A, RPH  Why was Xarelto prescribed for you? Xarelto was prescribed for you to reduce the risk of blood clots forming after orthopedic surgery. The medical term for these abnormal blood clots is venous thromboembolism (VTE).  What do you need to know about xarelto ? Take your Xarelto ONCE DAILY at the same time every day. You may take it either with or without food.  If you have difficulty swallowing the tablet whole, you may crush it and mix in applesauce just prior to taking your dose.  Take Xarelto exactly as prescribed by your doctor and DO NOT stop taking Xarelto without talking to the doctor who prescribed the medication.  Stopping without other VTE prevention medication to take the place of Xarelto may increase your risk of developing a clot.  After discharge, you should have regular check-up appointments with your healthcare provider that is prescribing your Xarelto.    What do you do if you miss a dose? If you miss a dose, take it as soon as you remember on the same day then  continue your regularly scheduled once daily regimen the next day. Do not take two doses of Xarelto on the same day.   Important Safety Information A possible side effect of Xarelto is bleeding. You should call your healthcare provider right away if you experience any of the following: ? Bleeding from an injury or your nose that does not stop. ? Unusual colored urine (red or dark brown) or unusual colored stools (red or black). ? Unusual bruising for unknown reasons. ? A serious fall or if you hit your head (even if there is no bleeding).  Some medicines may interact with Xarelto and might increase your risk of bleeding while on Xarelto. To help avoid this, consult your healthcare provider or pharmacist prior to using any new prescription or non-prescription medications, including herbals, vitamins, non-steroidal anti-inflammatory drugs (NSAIDs) and supplements.  This website has more information on Xarelto: VisitDestination.com.br.

## 2015-05-13 NOTE — Evaluation (Signed)
Occupational Therapy Evaluation Patient Details Name: [name] MRN: [id number] DOB: 1933-09-09 Today's Date: 05/13/2015    History of Present Illness s/p R posterior approach THA   Clinical Impression   This 81 year old female was admitted for the above surgery.  She was independent with adls prior to admission and currently needs max A for LB ADLs with +2 assist for safety.  She will benefit from skilled OT to increase safety and independence with adls.  Goals in acute are min guard to min A levels    Follow Up Recommendations  SNF    Equipment Recommendations  3 in 1 bedside comode    Recommendations for Other Services       Precautions / Restrictions Precautions Precautions: Posterior Hip Precaution Booklet Issued: Yes (comment) Precaution Comments: sign hung in room Restrictions Weight Bearing Restrictions: Yes RLE Weight Bearing: Partial weight bearing RLE Partial Weight Bearing Percentage or Pounds: 50% Other Position/Activity Restrictions: PWB 50%      Mobility Bed Mobility Overal bed mobility: Needs Assistance Bed Mobility: Supine to Sit     Supine to sit: Min assist;+2 for physical assistance     General bed mobility comments: cues for sequence; assist for RLE and trunk  Transfers Overall transfer level: Needs assistance Equipment used: Rolling walker (2 wheeled) Transfers: Sit to/from Stand Sit to Stand: Min assist;+2 safety/equipment;From elevated surface         General transfer comment: cues for UE/LE placement    Balance                                            ADL Overall ADL's : Needs assistance/impaired     Grooming: Set up;Sitting   Upper Body Bathing: Sitting;Supervision/ safety   Lower Body Bathing: Moderate assistance;Sit to/from stand;+2 for safety/equipment   Upper Body Dressing : Supervision/safety;Sitting   Lower Body Dressing: Maximal assistance;Sit to/from stand;+2 for safety/equipment                  General ADL Comments: Pt educated on AE and posterior THPs during ADLs.  She was given a Sports administrator and long shoehorn but has not used them yet.  Showed pt sock aide. Did not work with AE this visit.  Pt felt a little lightheaded initially and felt this was due to medications. After taking several steps, she became more lightheaded and was assisted to chair.     Vision     Perception     Praxis      Pertinent Vitals/Pain Pain Assessment: 0-10 Pain Score: 5  Pain Location: R hip Pain Descriptors / Indicators: Sore Pain Intervention(s): Limited activity within patient's tolerance;Monitored during session;Premedicated before session;Repositioned;Ice applied     Hand Dominance     Extremity/Trunk Assessment Upper Extremity Assessment Upper Extremity Assessment: Overall WFL for tasks assessed (has arthritis in L shoulder; hurts to lift beyond 90)      Cervical / Trunk Assessment Cervical / Trunk Assessment: Normal   Communication Communication Communication: No difficulties   Cognition Arousal/Alertness: Awake/alert Behavior During Therapy: WFL for tasks assessed/performed Overall Cognitive Status: Within Functional Limits for tasks assessed                     General Comments       Exercises      Shoulder Instructions      Home Living Family/patient  expects to be discharged to:: Skilled nursing facility Living Arrangements: Alone;Children                                      Prior Functioning/Environment Level of Independence: Independent             OT Diagnosis: Generalized weakness;Acute pain   OT Problem List: Decreased strength;Decreased activity tolerance;Decreased knowledge of use of DME or AE;Decreased knowledge of precautions;Pain   OT Treatment/Interventions: Self-care/ADL training;DME and/or AE instruction;Patient/family education    OT Goals(Current goals can be found in the care plan section) Acute  Rehab OT Goals Patient Stated Goal: get back to being independent OT Goal Formulation: With patient Time For Goal Achievement: 05/20/15 Potential to Achieve Goals: Good ADL Goals Pt Will Perform Lower Body Bathing: with min assist;with adaptive equipment;sit to/from stand Pt Will Perform Lower Body Dressing: with min assist;with adaptive equipment;sit to/from stand Pt Will Transfer to Toilet: with min guard assist;ambulating;bedside commode Pt Will Perform Toileting - Clothing Manipulation and hygiene: with min guard assist;sit to/from stand Additional ADL Goal #1: pt will recall 3/3 thps  OT Frequency: Min 2X/week   Barriers to D/C:            Co-evaluation PT/OT/SLP Co-Evaluation/Treatment: Yes Reason for Co-Treatment: For patient/therapist safety PT goals addressed during session: Mobility/safety with mobility OT goals addressed during session: ADL's and self-care      End of Session Nurse Communication: Mobility status (and lightheadedness)  Activity Tolerance:  (limited by lightheadedness) Patient left: in chair;with call bell/phone within reach   Time: 0912-0932 OT Time Calculation (min): 20 min Charges:  OT General Charges $OT Visit: 1 Procedure OT Evaluation $Initial OT Evaluation Tier I: 1 Procedure G-Codes:    [name] June 02, 2015, 12:16 PM  [name], OTR/L [phone] 2015-06-02

## 2015-05-13 NOTE — Progress Notes (Signed)
Physical Therapy Treatment Patient Details Name: DORREEN VALIENTE MRN: [id number] DOB: [birth year] Today's Date: May 29, 2015    History of Present Illness s/p R posterior approach THA    PT Comments    Pt continues motivated but progressing slowly 2* limited endurance.    Follow Up Recommendations  SNF     Equipment Recommendations  None recommended by PT    Recommendations for Other Services OT consult     Precautions / Restrictions Precautions Precautions: Posterior Hip Precaution Comments: Pt recalls 2/3 THR Restrictions Weight Bearing Restrictions: Yes RLE Weight Bearing: Partial weight bearing RLE Partial Weight Bearing Percentage or Pounds: 50%    Mobility  Bed Mobility Overal bed mobility: Needs Assistance Bed Mobility: Supine to Sit;Sit to Supine     Supine to sit: Min assist;Mod assist Sit to supine: Min assist;Mod assist   General bed mobility comments: cues for sequence; assist for RLE and trunk  Transfers Overall transfer level: Needs assistance Equipment used: Rolling walker (2 wheeled) Transfers: Sit to/from Stand Sit to Stand: Min assist;Mod assist Stand pivot transfers: Min assist;Mod assist;+2 safety/equipment       General transfer comment: cues for UE/LE placement and adherence to THP  Ambulation/Gait Ambulation/Gait assistance: Min assist;Mod assist;+2 safety/equipment Ambulation Distance (Feet): 16 Feet (and 5' from [facility]) Assistive device: Rolling walker (2 wheeled) Gait Pattern/deviations: Step-to pattern;Decreased step length - left;Shuffle;Trunk flexed     General Gait Details: cues for sequence, posture, position from RW and PWB (Pt attempting NWB)   Stairs            Wheelchair Mobility    Modified Rankin (Stroke Patients Only)       Balance                                    Cognition Arousal/Alertness: Awake/alert Behavior During Therapy: WFL for tasks assessed/performed Overall Cognitive  Status: Within Functional Limits for tasks assessed                      Exercises      General Comments        Pertinent Vitals/Pain Pain Assessment: 0-10 Pain Score: 4  Pain Location: R hip Pain Descriptors / Indicators: Aching;Sore Pain Intervention(s): Limited activity within patient's tolerance;Monitored during session;Premedicated before session;Ice applied    Home Living                      Prior Function            PT Goals (current goals can now be found in the care plan section) Acute Rehab PT Goals PT Goal Formulation: With patient Time For Goal Achievement: 05/17/15 Potential to Achieve Goals: Good Progress towards PT goals: Progressing toward goals    Frequency  7X/week    PT Plan Current plan remains appropriate    Co-evaluation             End of Session Equipment Utilized During Treatment: Gait belt Activity Tolerance: Patient tolerated treatment well Patient left: in bed;with call bell/phone within reach;with family/visitor present     Time:  -     Charges:  $Gait Training: 8-22 mins $Therapeutic Activity: 8-22 mins                    G Codes:      Lena Gores 05-29-15, 5:08 PM

## 2015-05-13 NOTE — Clinical Social Work Placement (Signed)
   CLINICAL SOCIAL WORK PLACEMENT  NOTE  Date:  05/13/2015  Patient Details  Name: [name] MRN: [id number] Date of Birth: 11-Apr-1933  Clinical Social Work is seeking post-discharge placement for this patient at the Skilled  Nursing Facility level of care (*CSW will initial, date and re-position this form in  chart as items are completed):  Yes   Patient/family provided with [facility] Clinical Social Work Department's list of facilities offering this level of care within the geographic area requested by the patient (or if unable, by the patient's family).  Yes   Patient/family informed of their freedom to choose among providers that offer the needed level of care, that participate in Medicare, Medicaid or managed care program needed by the patient, have an available bed and are willing to accept the patient.  Yes   Patient/family informed of [facility]'s ownership interest in [facility] and [facility], as well as of the fact that they are under no obligation to receive care at these facilities.  PASRR submitted to EDS on 05/12/15     PASRR number received on 05/12/15     Existing PASRR number confirmed on       FL2 transmitted to all facilities in geographic area requested by pt/family on 05/12/15     FL2 transmitted to all facilities within larger geographic area on       Patient informed that his/her managed care company has contracts with or will negotiate with certain facilities, including the following:            Patient/family informed of bed offers received.  Patient chooses bed at       Physician recommends and patient chooses bed at      Patient to be transferred to   on  .  Patient to be transferred to facility by       Patient family notified on   of transfer.  Name of family member notified:        PHYSICIAN       Additional Comment:    _______________________________________________ Royetta Asal, LCSW  [phone] 05/13/2015,  9:28 AM

## 2015-05-14 ENCOUNTER — Inpatient Hospital Stay
Admission: RE | Admit: 2015-05-14 | Discharge: 2015-05-19 | Disposition: A | Discharge status: Other Acute Inpatient Hospital | Payer: Medicare Other | Source: Ambulatory Visit | Attending: Internal Medicine | Admitting: Internal Medicine

## 2015-05-14 DIAGNOSIS — Z79899 Other long term (current) drug therapy: Secondary | ICD-10-CM | POA: Diagnosis not present

## 2015-05-14 DIAGNOSIS — R262 Difficulty in walking, not elsewhere classified: Secondary | ICD-10-CM | POA: Diagnosis not present

## 2015-05-14 DIAGNOSIS — Y9389 Activity, other specified: Secondary | ICD-10-CM | POA: Diagnosis not present

## 2015-05-14 DIAGNOSIS — E039 Hypothyroidism, unspecified: Secondary | ICD-10-CM | POA: Diagnosis not present

## 2015-05-14 DIAGNOSIS — E079 Disorder of thyroid, unspecified: Secondary | ICD-10-CM | POA: Diagnosis not present

## 2015-05-14 DIAGNOSIS — R52 Pain, unspecified: Secondary | ICD-10-CM | POA: Diagnosis not present

## 2015-05-14 DIAGNOSIS — R278 Other lack of coordination: Secondary | ICD-10-CM | POA: Diagnosis not present

## 2015-05-14 DIAGNOSIS — Y998 Other external cause status: Secondary | ICD-10-CM | POA: Diagnosis not present

## 2015-05-14 DIAGNOSIS — S73006A Unspecified dislocation of unspecified hip, initial encounter: Secondary | ICD-10-CM | POA: Diagnosis not present

## 2015-05-14 DIAGNOSIS — Z9889 Other specified postprocedural states: Secondary | ICD-10-CM | POA: Diagnosis not present

## 2015-05-14 DIAGNOSIS — I10 Essential (primary) hypertension: Secondary | ICD-10-CM | POA: Diagnosis not present

## 2015-05-14 DIAGNOSIS — Z9981 Dependence on supplemental oxygen: Secondary | ICD-10-CM | POA: Diagnosis not present

## 2015-05-14 DIAGNOSIS — T84020A Dislocation of internal right hip prosthesis, initial encounter: Secondary | ICD-10-CM | POA: Diagnosis not present

## 2015-05-14 DIAGNOSIS — S79911A Unspecified injury of right hip, initial encounter: Secondary | ICD-10-CM | POA: Diagnosis present

## 2015-05-14 DIAGNOSIS — Y9289 Other specified places as the place of occurrence of the external cause: Secondary | ICD-10-CM | POA: Diagnosis not present

## 2015-05-14 DIAGNOSIS — Z472 Encounter for removal of internal fixation device: Secondary | ICD-10-CM | POA: Diagnosis not present

## 2015-05-14 DIAGNOSIS — Z96641 Presence of right artificial hip joint: Secondary | ICD-10-CM | POA: Diagnosis not present

## 2015-05-14 DIAGNOSIS — M1990 Unspecified osteoarthritis, unspecified site: Secondary | ICD-10-CM | POA: Diagnosis not present

## 2015-05-14 DIAGNOSIS — Z471 Aftercare following joint replacement surgery: Secondary | ICD-10-CM | POA: Diagnosis not present

## 2015-05-14 DIAGNOSIS — X58XXXA Exposure to other specified factors, initial encounter: Secondary | ICD-10-CM | POA: Diagnosis not present

## 2015-05-14 DIAGNOSIS — Z7901 Long term (current) use of anticoagulants: Secondary | ICD-10-CM | POA: Diagnosis not present

## 2015-05-14 DIAGNOSIS — M25551 Pain in right hip: Secondary | ICD-10-CM | POA: Diagnosis not present

## 2015-05-14 DIAGNOSIS — M6281 Muscle weakness (generalized): Secondary | ICD-10-CM | POA: Diagnosis not present

## 2015-05-14 LAB — CBC
HCT: 24 % — ABNORMAL LOW (ref 36.0–46.0)
Hemoglobin: 8.3 g/dL — ABNORMAL LOW (ref 12.0–15.0)
MCH: 28.6 pg (ref 26.0–34.0)
MCHC: 34.6 g/dL (ref 30.0–36.0)
MCV: 82.8 fL (ref 78.0–100.0)
Platelets: 185 10*3/uL (ref 150–400)
RBC: 2.9 MIL/uL — ABNORMAL LOW (ref 3.87–5.11)
RDW: 12.3 % (ref 11.5–15.5)
WBC: 11.6 10*3/uL — ABNORMAL HIGH (ref 4.0–10.5)

## 2015-05-14 LAB — BASIC METABOLIC PANEL
Anion gap: 6 (ref 5–15)
BUN: 10 mg/dL (ref 6–20)
CALCIUM: 8.1 mg/dL — AB (ref 8.9–10.3)
CO2: 28 mmol/L (ref 22–32)
Chloride: 91 mmol/L — ABNORMAL LOW (ref 101–111)
Creatinine, Ser: 0.76 mg/dL (ref 0.44–1.00)
GFR calc Af Amer: >60 mL/min (ref >60)
GFR calc non Af Amer: >60 mL/min (ref >60)
GLUCOSE: 171 mg/dL — AB (ref 65–99)
Potassium: 2.9 mmol/L — ABNORMAL LOW (ref 3.5–5.1)
Sodium: 125 mmol/L — ABNORMAL LOW (ref 135–145)

## 2015-05-14 LAB — HEMOGLOBIN AND HEMATOCRIT, BLOOD
HCT: 25.1 % — ABNORMAL LOW (ref 36.0–46.0)
Hemoglobin: 9.1 g/dL — ABNORMAL LOW (ref 12.0–15.0)

## 2015-05-14 MED ORDER — POTASSIUM CHLORIDE CRYS ER 20 MEQ PO TBCR
20.0000 meq | EXTENDED_RELEASE_TABLET | Freq: Two times a day (BID) | ORAL | Status: DC
  Administered 2015-05-14: 20 meq via ORAL
  Filled 2015-05-14 (×2): qty 1

## 2015-05-14 MED ORDER — POTASSIUM CHLORIDE CRYS ER 20 MEQ PO TBCR: 20.0000 meq | EXTENDED_RELEASE_TABLET | Freq: Two times a day (BID) | ORAL | Status: DC

## 2015-05-14 MED ORDER — SODIUM CHLORIDE 0.9 % IV SOLN
INTRAVENOUS | Status: DC
  Administered 2015-05-14: 09:00:00 via INTRAVENOUS

## 2015-05-14 MED ORDER — HYDROCODONE-ACETAMINOPHEN 5-325 MG PO TABS: 1.0000 | ORAL_TABLET | ORAL | Status: DC | PRN

## 2015-05-14 MED ORDER — RIVAROXABAN 10 MG PO TABS: 10.0000 mg | ORAL_TABLET | Freq: Every day | ORAL | Status: DC

## 2015-05-14 MED ORDER — TRAMADOL HCL 50 MG PO TABS: 50.0000 mg | ORAL_TABLET | Freq: Four times a day (QID) | ORAL | Status: DC | PRN

## 2015-05-14 MED ORDER — METHOCARBAMOL 500 MG PO TABS: 500.0000 mg | ORAL_TABLET | Freq: Four times a day (QID) | ORAL | Status: DC | PRN

## 2015-05-14 NOTE — Care Management Important Message (Signed)
Important Message  Patient Details  Name: [name] MRN: [id number] Date of Birth: 03/05/1933   Medicare Important Message Given:  [facility] notification given    Haskell Flirt 05/14/2015, 10:39 AMImportant Message  Patient Details  Name: [name] MRN: [id number] Date of Birth: 01/21/1933   Medicare Important Message Given:  Yes-second notification given    Haskell Flirt 05/14/2015, 10:39 AM

## 2015-05-14 NOTE — Progress Notes (Signed)
Attempted to call report to [facility], automated response not connecting numbers appropriately. Sharrell Ku RN

## 2015-05-14 NOTE — Progress Notes (Signed)
Subjective: 2 Days Post-Op Procedure(s) (LRB): RIGHT TOTAL HIP ARTHROPLASTY (Right) Patient reports pain as 2 on 0-10 scale.Hbg 8.3,will repeat at 3:00PM today and recheck. Slight issue with with voiding. Awaiting SNF.    Objective: Vital signs in last 24 hours: Temp:  [98 F (36.7 C)-99.2 F (37.3 C)] 98 F (36.7 C) (08/12 0503) Pulse Rate:  [81-89] 88 (08/12 0503) Resp:  [14-16] 14 (08/12 0503) BP: (102-166)/(53-79) 102/66 mmHg (08/12 0503) SpO2:  [97 %-98 %] 97 % (08/12 0503)  Intake/Output from previous day: 08/11 0701 - 08/12 0700 In: 1340 [P.O.:540; I.V.:800] Out: 1275 [Urine:1275] Intake/Output this shift:     Recent Labs  05/13/15 0443 05/14/15 0430  HGB 9.2* 8.3*    Recent Labs  05/13/15 0443 05/14/15 0430  WBC 9.6 11.6*  RBC 3.20* 2.90*  HCT 26.6* 24.0*  PLT 203 185    Recent Labs  05/13/15 0443 05/14/15 0430  NA 131* 125*  K 3.1* 2.9*  CL 97* 91*  CO2 27 28  BUN 12 10  CREATININE 0.84 0.76  GLUCOSE 144* 171*  CALCIUM 8.3* 8.1*   No results for input(s): LABPT, INR in the last 72 hours.  Neurologically intact Dorsiflexion/Plantar flexion intact Compartment soft  Assessment/Plan: 2 Days Post-Op Procedure(s) (LRB): RIGHT TOTAL HIP ARTHROPLASTY (Right) Up with therapy Discharge to SNF  [name] A 05/14/2015, 7:17 AM

## 2015-05-14 NOTE — Clinical Social Work Placement (Signed)
   CLINICAL SOCIAL WORK PLACEMENT  NOTE  Date:  05/14/2015  Patient Details  Name: [name] MRN: [id number] Date of Birth: 01/06/1933  Clinical Social Work is seeking post-discharge placement for this patient at the [facility] of care (*CSW will initial, date and re-position this form in  chart as items are completed):  Yes   Patient/family provided with [place] Clinical Social Work Department's list of facilities offering this Griffith of care within the geographic area requested by the patient (or if unable, by the patient's family).  Yes   Patient/family informed of their freedom to choose among providers that offer the needed Griffith of care, that participate in Medicare, Medicaid or managed care program needed by the patient, have an available bed and are willing to accept the patient.  Yes   Patient/family informed of [facility]'s ownership interest in [facility] and [facility], as well as of the fact that they are under no obligation to receive care at these facilities.  PASRR submitted to EDS on 05/12/15     PASRR number received on 05/12/15     Existing PASRR number confirmed on       FL2 transmitted to all facilities in geographic area requested by pt/family on 05/12/15     FL2 transmitted to all facilities within larger geographic area on       Patient informed that his/her managed care company has contracts with or will negotiate with certain facilities, including the following:        Yes   Patient/family informed of bed offers received.  Patient chooses bed at [facility]     Physician recommends and patient chooses bed at      Patient to be transferred to [facility] on 05/14/15.  Patient to be transferred to facility by [facility]     Patient family notified on 05/14/15 of transfer.  Name of family member notified:  DAUGHTER     PHYSICIAN       Additional Comment: Pt / daughter in agreement with d/c to  [facility] today. [facility] transport is required. Pt / daughter are aware that out of pocket costs may be associated with [facility] transport. NSG reviewed d/c summary, scripts, avs. Scripts included in d/c packet.   _______________________________________________ Royetta Asal, LCSW  [phone] 05/14/2015, 4:20 PM

## 2015-05-14 NOTE — Progress Notes (Signed)
05/13/15 2335 Nursing Patient voided 100cc at this time. Bladder scanned and straight cathed for 450 cc urine. Will continue to monitor patient.

## 2015-05-14 NOTE — Progress Notes (Signed)
05/14/15 0605 Nursing Patient still unable to void. Bladder scanned for 242 cc urine. Patient requesting to try to void again this am. Will continue to monitor patient.

## 2015-05-14 NOTE — Discharge Summary (Signed)
Physician Discharge Summary   Patient ID: [name] MRN: [id number] DOB/AGE: [birth year] 81 y.o.  Admit date: 05/12/2015 /Discharge date: 05/14/2015  Primary Diagnosis: Primary osteoarthritis, right hip  Admission Diagnoses:  Past Medical History  Diagnosis Date  . Hypertension   . Arthritis   . Thyroid disease    Discharge Diagnoses:   Active Problems:   H/O total hip arthroplasty  Estimated body mass index is 25.29 kg/(m^2) as calculated from the following:   Height as of this encounter: _0  (1.651 m).   Weight as of this encounter: 68.947 kg (152 lb).  Procedure(s) (LRB): RIGHT TOTAL HIP ARTHROPLASTY (Right)   Consults: None  HPI: [name], 81 y.o. female, has a history of pain and functional disability in the right hip due to arthritis and has failed non-surgical conservative treatments for greater than 12 weeks to includeNSAID's and/or analgesics, corticosteriod injections, use of assistive devices and activity modification. Onset of symptoms was gradual, starting 1 year ago with gradually worsening course since that time. The patient noted no past surgery on the right hip(s). Patient currently rates pain in the right hip(s) at 8 out of 10 with activity. Patient has night pain, worsening of pain with activity and weight bearing, pain that interferes with activities of daily living, pain with passive range of motion and crepitus. Patient has evidence of periarticular osteophytes and joint space narrowing by imaging studies.There is no active infection.  Laboratory Data: Admission on 05/12/2015  Component Date Value Ref Range Status  . ABO/RH(D) 05/12/2015 A NEG   Final  . Antibody Screen 05/12/2015 POS   Final  . Sample Expiration 05/12/2015 05/15/2015   Final  . Antibody Identification 08/04/1593 ANTI D   Final  . DAT, IgG 05/12/2015 NEG   Final  . Unit Number 05/12/2015 [id number]   Final  . Blood Component Type 05/12/2015 RBC LR PHER1   Final  .  Unit division 05/12/2015 00   Final  . Status of Unit 05/12/2015 ALLOCATED   Final  . Transfusion Status 05/12/2015 OK TO TRANSFUSE   Final  . Crossmatch Result 05/12/2015 COMPATIBLE   Final  . Unit Number 05/12/2015 [id number]   Final  . Blood Component Type 05/12/2015 RBC LR PHER1   Final  . Unit division 05/12/2015 00   Final  . Status of Unit 05/12/2015 ALLOCATED   Final  . Transfusion Status 05/12/2015 OK TO TRANSFUSE   Final  . Crossmatch Result 05/12/2015 COMPATIBLE   Final  . WBC 05/13/2015 9.6  4.0 - 10.5 K/uL Final  . RBC 05/13/2015 3.20* 3.87 - 5.11 MIL/uL Final  . Hemoglobin 05/13/2015 9.2* 12.0 - 15.0 g/dL Final  . HCT 05/13/2015 26.6* 36.0 - 46.0 % Final  . MCV 05/13/2015 83.1  78.0 - 100.0 fL Final  . MCH 05/13/2015 28.8  26.0 - 34.0 pg Final  . MCHC 05/13/2015 34.6  30.0 - 36.0 g/dL Final  . RDW 05/13/2015 12.4  11.5 - 15.5 % Final  . Platelets 05/13/2015 203  150 - 400 K/uL Final  . Sodium 05/13/2015 131* 135 - 145 mmol/L Final  . Potassium 05/13/2015 3.1* 3.5 - 5.1 mmol/L Final  . Chloride 05/13/2015 97* 101 - 111 mmol/L Final  . CO2 05/13/2015 27  22 - 32 mmol/L Final  . Glucose, Bld 05/13/2015 144* 65 - 99 mg/dL Final  . BUN 05/13/2015 12  6 - 20 mg/dL Final  . Creatinine, Ser 05/13/2015 0.84  0.44 - 1.00 mg/dL Final  .  Calcium 05/13/2015 8.3* 8.9 - 10.3 mg/dL Final  . GFR calc non Af Amer 05/13/2015 >60  >60 mL/min Final  . GFR calc Af Amer 05/13/2015 >60  >60 mL/min Final   Comment: (NOTE) The eGFR has been calculated using the CKD EPI equation. This calculation has not been validated in all clinical situations. eGFR's persistently <60 mL/min signify possible Chronic Kidney Disease.   . Anion gap 05/13/2015 7  5 - 15 Final  . WBC 05/14/2015 11.6* 4.0 - 10.5 K/uL Final  . RBC 05/14/2015 2.90* 3.87 - 5.11 MIL/uL Final  . Hemoglobin 05/14/2015 8.3* 12.0 - 15.0 g/dL Final  . HCT 05/14/2015 24.0* 36.0 - 46.0 % Final  . MCV 05/14/2015 82.8  78.0 - 100.0 fL  Final  . MCH 05/14/2015 28.6  26.0 - 34.0 pg Final  . MCHC 05/14/2015 34.6  30.0 - 36.0 g/dL Final  . RDW 05/14/2015 12.3  11.5 - 15.5 % Final  . Platelets 05/14/2015 185  150 - 400 K/uL Final  . Sodium 05/14/2015 125* 135 - 145 mmol/L Final  . Potassium 05/14/2015 2.9* 3.5 - 5.1 mmol/L Final  . Chloride 05/14/2015 91* 101 - 111 mmol/L Final  . CO2 05/14/2015 28  22 - 32 mmol/L Final  . Glucose, Bld 05/14/2015 171* 65 - 99 mg/dL Final  . BUN 05/14/2015 10  6 - 20 mg/dL Final  . Creatinine, Ser 05/14/2015 0.76  0.44 - 1.00 mg/dL Final  . Calcium 05/14/2015 8.1* 8.9 - 10.3 mg/dL Final  . GFR calc non Af Amer 05/14/2015 >60  >60 mL/min Final  . GFR calc Af Amer 05/14/2015 >60  >60 mL/min Final   Comment: (NOTE) The eGFR has been calculated using the CKD EPI equation. This calculation has not been validated in all clinical situations. eGFR's persistently <60 mL/min signify possible Chronic Kidney Disease.   Georgiann Hahn gap 05/14/2015 6  5 - 15 Final  Hospital Outpatient Visit on 05/04/2015  Component Date Value Ref Range Status  . Specimen Description 05/04/2015 NOSE   Final  . Special Requests 05/04/2015 NONE   Final  . Culture 05/04/2015    Final                   Value:NOMRSA Performed at [facility]   . Report Status 05/04/2015 05/06/2015 FINAL   Final  Hospital Outpatient Visit on 05/04/2015  Component Date Value Ref Range Status  . aPTT 05/04/2015 31  24 - 37 seconds Final  . MRSA, PCR 05/04/2015 INVALID RESULTS, SPECIMEN SENT FOR CULTURE* NEGATIVE Final  . Staphylococcus aureus 05/04/2015 INVALID RESULTS, SPECIMEN SENT FOR CULTURE* NEGATIVE Final   Comment:        The Xpert SA Assay (FDA approved for NASAL specimens in patients over 49 years of age), is one component of a comprehensive surveillance program.  Test performance has been validated by [facility] for patients greater than or equal to 30 year old. It is not intended to diagnose infection nor to guide  or monitor treatment.   . WBC 05/04/2015 7.8  4.0 - 10.5 K/uL Final  . RBC 05/04/2015 3.97  3.87 - 5.11 MIL/uL Final  . Hemoglobin 05/04/2015 11.5* 12.0 - 15.0 g/dL Final  . HCT 05/04/2015 33.6* 36.0 - 46.0 % Final  . MCV 05/04/2015 84.6  78.0 - 100.0 fL Final  . MCH 05/04/2015 29.0  26.0 - 34.0 pg Final  . MCHC 05/04/2015 34.2  30.0 - 36.0 g/dL Final  . RDW 05/04/2015  12.6  11.5 - 15.5 % Final  . Platelets 05/04/2015 233  150 - 400 K/uL Final  . Neutrophils Relative % 05/04/2015 58  43 - 77 % Final  . Neutro Abs 05/04/2015 4.6  1.7 - 7.7 K/uL Final  . Lymphocytes Relative 05/04/2015 29  12 - 46 % Final  . Lymphs Abs 05/04/2015 2.3  0.7 - 4.0 K/uL Final  . Monocytes Relative 05/04/2015 7  3 - 12 % Final  . Monocytes Absolute 05/04/2015 0.6  0.1 - 1.0 K/uL Final  . Eosinophils Relative 05/04/2015 5  0 - 5 % Final  . Eosinophils Absolute 05/04/2015 0.4  0.0 - 0.7 K/uL Final  . Basophils Relative 05/04/2015 1  0 - 1 % Final  . Basophils Absolute 05/04/2015 0.0  0.0 - 0.1 K/uL Final  . Sodium 05/04/2015 131* 135 - 145 mmol/L Final  . Potassium 05/04/2015 3.8  3.5 - 5.1 mmol/L Final  . Chloride 05/04/2015 99* 101 - 111 mmol/L Final  . CO2 05/04/2015 28  22 - 32 mmol/L Final  . Glucose, Bld 05/04/2015 113* 65 - 99 mg/dL Final  . BUN 05/04/2015 18  6 - 20 mg/dL Final  . Creatinine, Ser 05/04/2015 1.02* 0.44 - 1.00 mg/dL Final  . Calcium 05/04/2015 8.9  8.9 - 10.3 mg/dL Final  . Total Protein 05/04/2015 6.9  6.5 - 8.1 g/dL Final  . Albumin 05/04/2015 3.8  3.5 - 5.0 g/dL Final  . AST 05/04/2015 19  15 - 41 U/L Final  . ALT 05/04/2015 10* 14 - 54 U/L Final  . Alkaline Phosphatase 05/04/2015 71  38 - 126 U/L Final  . Total Bilirubin 05/04/2015 0.7  0.3 - 1.2 mg/dL Final  . GFR calc non Af Amer 05/04/2015 51* >60 mL/min Final  . GFR calc Af Amer 05/04/2015 59* >60 mL/min Final   Comment: (NOTE) The eGFR has been calculated using the CKD EPI equation. This calculation has not been validated  in all clinical situations. eGFR's persistently <60 mL/min signify possible Chronic Kidney Disease.   . Anion gap 05/04/2015 4* 5 - 15 Final  . Prothrombin Time 05/04/2015 13.9  11.6 - 15.2 seconds Final  . INR 05/04/2015 1.05  0.00 - 1.49 Final  . ABO/RH(D) 05/04/2015 A NEG   Final  . Antibody Screen 05/04/2015 POS   Final  . Sample Expiration 05/04/2015 05/07/2015   Final  . Antibody Identification 03/50/0938 ANTI D   Final  . DAT, IgG 05/04/2015 NEG   Final  . Color, Urine 05/04/2015 YELLOW  YELLOW Final  . APPearance 05/04/2015 TURBID* CLEAR Final  . Specific Gravity, Urine 05/04/2015 1.013  1.005 - 1.030 Final  . pH 05/04/2015 6.0  5.0 - 8.0 Final  . Glucose, UA 05/04/2015 NEGATIVE  NEGATIVE mg/dL Final  . Hgb urine dipstick 05/04/2015 TRACE* NEGATIVE Final  . Bilirubin Urine 05/04/2015 NEGATIVE  NEGATIVE Final  . Ketones, ur 05/04/2015 NEGATIVE  NEGATIVE mg/dL Final  . Protein, ur 05/04/2015 NEGATIVE  NEGATIVE mg/dL Final  . Urobilinogen, UA 05/04/2015 1.0  0.0 - 1.0 mg/dL Final  . Nitrite 05/04/2015 NEGATIVE  NEGATIVE Final  . Leukocytes, UA 05/04/2015 LARGE* NEGATIVE Final  . WBC, UA 05/04/2015 TOO NUMEROUS TO COUNT  <3 WBC/hpf Final  . RBC / HPF 05/04/2015 0-2  <3 RBC/hpf Final  . Bacteria, UA 05/04/2015 MANY* RARE Final     X-Rays:Dg Chest 2 View  05/04/2015   CLINICAL DATA:  Preop for right hip surgery, history of hypertension  EXAM:  CHEST  2 VIEW  COMPARISON:  Limited CT chest of 09/16/2004  FINDINGS: The lungs are clear and somewhat hyperaerated. With flattened hemidiaphragms, emphysema is a definite consideration. Mediastinal and hilar contours are unremarkable. The heart is within normal limits in size. No bony abnormality is seen.  IMPRESSION: No active cardiopulmonary disease. Hyper aeration consistent with emphysema.   Electronically Signed   By: Ivar Drape M.D.   On: 05/04/2015 16:52   Dg Hip Port Unilat With Pelvis 1v Right  05/12/2015   CLINICAL DATA:  Status  post right hip replacement  EXAM: DG HIP (WITH OR WITHOUT PELVIS) 1V PORT RIGHT  COMPARISON:  None.  FINDINGS: A right hip replacement is seen. No acute bony abnormality is noted.  IMPRESSION: Status post right hip replacement   Electronically Signed   By: Inez Catalina M.D.   On: 05/12/2015 14:07     Hospital Course: Patient was admitted to [facility] and taken to the OR and underwent the above state procedure without complications.  Patient tolerated the procedure well and was later transferred to the recovery room and then to the orthopaedic floor for postoperative care.  They were given PO and IV analgesics for pain control following their surgery.  They were given 24 hours of postoperative antibiotics of  Anti-infectives    Start     Dose/Rate Route Frequency Ordered Stop   05/12/15 1800  ceFAZolin (ANCEF) IVPB 1 g/50 mL premix     1 g 100 mL/hr over 30 Minutes Intravenous Every 6 hours 05/12/15 1604 05/12/15 2339   05/12/15 1150  polymyxin B 500,000 Units, bacitracin 50,000 Units in sodium chloride irrigation 0.9 % 500 mL irrigation  Status:  Discontinued       As needed 05/12/15 1150 05/12/15 1321   05/12/15 0916  ceFAZolin (ANCEF) IVPB 2 g/50 mL premix     2 g 100 mL/hr over 30 Minutes Intravenous On call to O.R. 05/12/15 [id number] 05/12/15 1200     and started on DVT prophylaxis in the form of Xarelto.   PT and OT were ordered for total hip protocol.  The patient was allowed to be PWB with therapy. Discharge planning was consulted to help with postop disposition and equipment needs.  Patient had a fair night on the evening of surgery.  They started to get up OOB with therapy on day one. Continued to work with therapy into day two.  She continued to have some issues with urinary retention, which she has prior to surgery.  Patient was seen in rounds and was ready to go to SNF if she became able to void on her own.    Diet: Cardiac diet Activity:PWB No bending hip over 90 degrees- A  "L" Angle Do not cross legs Do not let foot roll inward When turning these patients a pillow should be placed between the patient's legs to prevent crossing. Patients should have the affected knee fully extended when trying to sit or stand from all surfaces to prevent excessive hip flexion. When ambulating and turning toward the affected side the affected leg should have the toes turned out prior to moving the walker and the rest of patient's body as to prevent internal rotation/ turning in of the leg. Abduction pillows are the most effective way to prevent a patient from not crossing legs or turning toes in at rest. If an abduction pillow is not ordered placing a regular pillow length wise between the patient's legs is also an effective reminder.  It is imperative that these precautions be maintained so that the surgical hip does not dislocate. Follow-up:in 2 weeks Disposition - Skilled nursing facility Discharged Condition: stable   Discharge Instructions    Call MD / Call 911    Complete by:  As directed   If you experience chest pain or shortness of breath, CALL 911 and be transported to the hospital emergency room.  If you develope a fever above 101 F, pus (white drainage) or increased drainage or redness at the wound, or calf pain, call your surgeon's office.     Constipation Prevention    Complete by:  As directed   Drink plenty of fluids.  Prune juice may be helpful.  You may use a stool softener, such as Colace (over the counter) 100 mg twice a day.  Use MiraLax (over the counter) for constipation as needed.     Diet - low sodium heart healthy    Complete by:  As directed      Discharge instructions    Complete by:  As directed   INSTRUCTIONS AFTER JOINT REPLACEMENT   Remove items at home which could result in a fall. This includes throw rugs or furniture in walking pathways ICE to the affected joint every three hours while awake for 30 minutes at a time, for at least the first 3-5  days, and then as needed for pain and swelling.  Continue to use ice for pain and swelling. You may notice swelling that will progress down to the foot and ankle.  This is normal after surgery.  Elevate your leg when you are not up walking on it.   Continue to use the breathing machine you got in the hospital (incentive spirometer) which will help keep your temperature down.  It is common for your temperature to cycle up and down following surgery, especially at night when you are not up moving around and exerting yourself.  The breathing machine keeps your lungs expanded and your temperature down.   DIET:  As you were doing prior to hospitalization, we recommend a well-balanced diet.  DRESSING / WOUND CARE / SHOWERING  Keep the surgical dressing until follow up.  The dressing is water proof, so you can shower without any extra covering.  IF THE DRESSING FALLS OFF or the wound gets wet inside, change the dressing with sterile gauze.  Please use good hand washing techniques before changing the dressing.  Do not use any lotions or creams on the incision until instructed by your surgeon.    ACTIVITY  Increase activity slowly as tolerated, but follow the weight bearing instructions below.   No driving for 6 weeks or until further direction given by your physician.  You cannot drive while taking narcotics.  No lifting or carrying greater than 10 lbs. until further directed by your surgeon. Avoid periods of inactivity such as sitting longer than an hour when not asleep. This helps prevent blood clots.  You may return to work once you are authorized by your doctor.     WEIGHT BEARING   Partial weight bearing with assist device 50% right leg.  You may be weightbearing as tolerated one week postop.   EXERCISES  Results after joint replacement surgery are often greatly improved when you follow the exercise, range of motion and muscle strengthening exercises prescribed by your doctor. Safety measures  are also important to protect the joint from further injury. Any time any of these exercises cause you to have increased pain or  swelling, decrease what you are doing until you are comfortable again and then slowly increase them. If you have problems or questions, call your caregiver or physical therapist for advice.   Rehabilitation is important following a joint replacement. After just a few days of immobilization, the muscles of the leg can become weakened and shrink (atrophy).  These exercises are designed to build up the tone and strength of the thigh and leg muscles and to improve motion. Often times heat used for twenty to thirty minutes before working out will loosen up your tissues and help with improving the range of motion but do not use heat for the first two weeks following surgery (sometimes heat can increase post-operative swelling).    A rehabilitation program following joint replacement surgery can speed recovery and prevent re-injury in the future due to weakened muscles. Contact your doctor or a physical therapist for more information on knee rehabilitation.    CONSTIPATION  Constipation is defined medically as fewer than three stools per week and severe constipation as less than one stool per week.  Even if you have a regular bowel pattern at home, your normal regimen is likely to be disrupted due to multiple reasons following surgery.  Combination of anesthesia, postoperative narcotics, change in appetite and fluid intake all can affect your bowels.   YOU MUST use at least one of the following options; they are listed in order of increasing strength to get the job done.  They are all available over the counter, and you may need to use some, POSSIBLY even all of these options:    Drink plenty of fluids (prune juice may be helpful) and high fiber foods Colace 100 mg by mouth twice a day  Senokot for constipation as directed and as needed Dulcolax (bisacodyl), take with full glass  of water  Miralax (polyethylene glycol) once or twice a day as needed.  If you have tried all these things and are unable to have a bowel movement in the first 3-4 days after surgery call either your surgeon or your primary doctor.    If you experience loose stools or diarrhea, hold the medications until you stool forms back up.  If your symptoms do not get better within 1 week or if they get worse, check with your doctor.  If you experience "the worst abdominal pain ever" or develop nausea or vomiting, please contact the office immediately for further recommendations for treatment.   ITCHING:  If you experience itching with your medications, try taking only a single pain pill, or even half a pain pill at a time.  You can also use Benadryl over the counter for itching or also to help with sleep.   TED HOSE STOCKINGS:  Use stockings on both legs until for at least 2 weeks or as directed by physician office. They may be removed at night for sleeping.  MEDICATIONS:  See your medication summary on the "After Visit Summary" that nursing will review with you.  You may have some home medications which will be placed on hold until you complete the course of blood thinner medication.  It is important for you to complete the blood thinner medication as prescribed.  PRECAUTIONS:  If you experience chest pain or shortness of breath - call 911 immediately for transfer to the hospital emergency department.   If you develop a fever greater that 101 F, purulent drainage from wound, increased redness or drainage from wound, foul odor from the wound/dressing, or calf pain -  CONTACT YOUR SURGEON.                                                   FOLLOW-UP APPOINTMENTS:  If you do not already have a post-op appointment, please call the office for an appointment to be seen by your surgeon.  Guidelines for how soon to be seen are listed in your "After Visit Summary", but are typically between 1-4 weeks after  surgery.  OTHER INSTRUCTIONS: Do not take vitamins or supplements while taking Xarelto (blood thinner)   MAKE SURE YOU:  Understand these instructions.  Get help right away if you are not doing well or get worse.    Thank you for letting us be a part of your medical care team.  It is a privilege we respect greatly.  We hope these instructions will help you stay on track for a fast and full recovery!     Follow the hip precautions as taught in Physical Therapy    Complete by:  As directed      Increase activity slowly as tolerated    Complete by:  As directed             Medication List    STOP taking these medications        naproxen sodium 220 MG tablet  Commonly known as:  ANAPROX      TAKE these medications        HYDROcodone-acetaminophen 5-325 MG per tablet  Commonly known as:  NORCO/VICODIN  Take 1-2 tablets by mouth every 4 (four) hours as needed for severe pain (breakthrough pain).     levothyroxine 112 MCG tablet  Commonly known as:  SYNTHROID, LEVOTHROID  Take 112 mcg by mouth daily before breakfast.     lisinopril-hydrochlorothiazide 20-25 MG per tablet  Commonly known as:  PRINZIDE,ZESTORETIC  Take 1 tablet by mouth every evening.     methocarbamol 500 MG tablet  Commonly known as:  ROBAXIN  Take 1 tablet (500 mg total) by mouth every 6 (six) hours as needed for muscle spasms.     rivaroxaban 10 MG Tabs tablet  Commonly known as:  XARELTO  Take 1 tablet (10 mg total) by mouth daily with breakfast.     traMADol 50 MG tablet  Commonly known as:  ULTRAM  Take 1-2 tablets (50-100 mg total) by mouth every 6 (six) hours as needed for moderate pain.     traZODone 50 MG tablet  Commonly known as:  DESYREL  Take 100 mg by mouth at bedtime.           Follow-up Information    Follow up with GIOFFRE,RONALD A, MD. Schedule an appointment as soon as possible for a visit in 2 weeks.   Specialty:  Orthopedic Surgery   Contact information:   [address] [place] [address] [phone]       Signed: Ardeen Jourdain, PA-C Orthopaedic Surgery 05/14/2015, 7:11 AM

## 2015-05-16 LAB — TYPE AND SCREEN
ABO/RH(D): A NEG
Antibody Screen: POSITIVE
DAT, IgG: NEGATIVE
UNIT DIVISION: 0
Unit division: 0

## 2015-05-17 NOTE — Anesthesia Postprocedure Evaluation (Signed)
  Anesthesia Post-op Note  Patient: [name]  Procedure(s) Performed: Procedure(s) (LRB): RIGHT TOTAL HIP ARTHROPLASTY (Right)  Patient Location: PACU  Anesthesia Type: General  Level of Consciousness: awake and alert   Airway and Oxygen Therapy: Patient Spontanous Breathing  Post-op Pain: mild  Post-op Assessment: Post-op Vital signs reviewed, Patient's Cardiovascular Status Stable, Respiratory Function Stable, Patent Airway and No signs of Nausea or vomiting  Last Vitals:  Filed Vitals:   05/14/15 0503  BP: 102/66  Pulse: 88  Temp: 36.7 C  Resp: 14    Post-op Vital Signs: stable   Complications: No apparent anesthesia complications

## 2015-05-19 ENCOUNTER — Emergency Department (HOSPITAL_COMMUNITY)
Admission: EM | Admit: 2015-05-19 | Discharge: 2015-05-20 | Disposition: A | Discharge status: Home / Self Care | Payer: Medicare Other | Attending: Emergency Medicine | Admitting: Emergency Medicine

## 2015-05-19 ENCOUNTER — Emergency Department (HOSPITAL_COMMUNITY): Payer: Medicare Other

## 2015-05-19 ENCOUNTER — Encounter (HOSPITAL_COMMUNITY): Payer: Self-pay | Admitting: Nurse Practitioner

## 2015-05-19 DIAGNOSIS — T84020A Dislocation of internal right hip prosthesis, initial encounter: Secondary | ICD-10-CM | POA: Insufficient documentation

## 2015-05-19 DIAGNOSIS — Z9889 Other specified postprocedural states: Secondary | ICD-10-CM | POA: Diagnosis not present

## 2015-05-19 DIAGNOSIS — M1990 Unspecified osteoarthritis, unspecified site: Secondary | ICD-10-CM | POA: Insufficient documentation

## 2015-05-19 DIAGNOSIS — M25551 Pain in right hip: Secondary | ICD-10-CM | POA: Diagnosis not present

## 2015-05-19 DIAGNOSIS — Y998 Other external cause status: Secondary | ICD-10-CM | POA: Insufficient documentation

## 2015-05-19 DIAGNOSIS — Z7901 Long term (current) use of anticoagulants: Secondary | ICD-10-CM | POA: Diagnosis not present

## 2015-05-19 DIAGNOSIS — Z471 Aftercare following joint replacement surgery: Secondary | ICD-10-CM | POA: Diagnosis not present

## 2015-05-19 DIAGNOSIS — E079 Disorder of thyroid, unspecified: Secondary | ICD-10-CM | POA: Diagnosis not present

## 2015-05-19 DIAGNOSIS — Z79899 Other long term (current) drug therapy: Secondary | ICD-10-CM | POA: Diagnosis not present

## 2015-05-19 DIAGNOSIS — I10 Essential (primary) hypertension: Secondary | ICD-10-CM | POA: Insufficient documentation

## 2015-05-19 DIAGNOSIS — Z96641 Presence of right artificial hip joint: Secondary | ICD-10-CM | POA: Diagnosis not present

## 2015-05-19 DIAGNOSIS — S73004A Unspecified dislocation of right hip, initial encounter: Secondary | ICD-10-CM

## 2015-05-19 DIAGNOSIS — Y9389 Activity, other specified: Secondary | ICD-10-CM | POA: Insufficient documentation

## 2015-05-19 DIAGNOSIS — W19XXXA Unspecified fall, initial encounter: Secondary | ICD-10-CM

## 2015-05-19 DIAGNOSIS — Y9289 Other specified places as the place of occurrence of the external cause: Secondary | ICD-10-CM | POA: Insufficient documentation

## 2015-05-19 DIAGNOSIS — Z472 Encounter for removal of internal fixation device: Secondary | ICD-10-CM | POA: Diagnosis not present

## 2015-05-19 DIAGNOSIS — X58XXXA Exposure to other specified factors, initial encounter: Secondary | ICD-10-CM | POA: Insufficient documentation

## 2015-05-19 MED ORDER — PROPOFOL 10 MG/ML IV BOLUS
0.5000 mg/kg | Freq: Once | INTRAVENOUS | Status: AC
Start: 2015-05-19 — End: 2015-05-20
  Administered 2015-05-20: 34.5 mg via INTRAVENOUS
  Filled 2015-05-19: qty 1

## 2015-05-19 NOTE — ED Provider Notes (Signed)
CSN: [id number]     Arrival date & time 05/19/15  2222 History  This chart was scribed for Loren Racer, MD by Doreatha Martin, ED Scribe. This patient was seen in room WA13/WA13 and the patient's care was started at 11:12 PM.     Chief Complaint  Patient presents with  . Hip Injury    Probable Dislocation   The history is provided by the patient. No language interpreter was used.    HPI Comments: [name] is a 81 y.o. female with Hx of recent right total hip arthroplasty (05/12/2015 by Dr. Darrelyn Hillock), HTN, arthritis, thyroid disease who presents to the Emergency Department complaining of a right hip injury onset tonight at 1900 while getting into the bed. Per pt, she heard a clicking sound in her right hip while she and a Nurse tech were raising her legs into the bed. She states she has not been able to walk on it since. She states that this was her first hip replacement and first major surgery. She states associated decreased appetite, with her last meal at lunchtime and her last fluid intake at 1850. She denies falls or any other injuries.    Past Medical History  Diagnosis Date  . Hypertension   . Arthritis   . Thyroid disease    Past Surgical History  Procedure Laterality Date  . Tonsillectomy    . Tubal ligation    . Breast surgery  1993    L BREAST BX - BENIGN  . Total hip arthroplasty Right 05/12/2015    Procedure: RIGHT TOTAL HIP ARTHROPLASTY;  Surgeon: Ranee Gosselin, MD;  Location: [facility];  Service: Orthopedics;  Laterality: Right;   History reviewed. No pertinent family history. Social History  Substance Use Topics  . Smoking status: Never Smoker   . Smokeless tobacco: None  . Alcohol Use: Yes     Comment: RARE   OB History    No data available     Review of Systems  Constitutional: Negative for fever and chills.  Respiratory: Negative for shortness of breath.   Cardiovascular: Negative for chest pain and leg swelling.  Gastrointestinal: Negative for nausea,  vomiting and abdominal pain.  Musculoskeletal: Positive for arthralgias.  Skin: Negative for wound.  Neurological: Negative for dizziness, weakness, light-headedness, numbness and headaches.  All other systems reviewed and are negative.   Allergies  Sulfa antibiotics  Home Medications   Prior to Admission medications   Medication Sig Start Date End Date Taking? Authorizing Provider  HYDROcodone-acetaminophen (NORCO/VICODIN) 5-325 MG per tablet Take 1-2 tablets by mouth every 4 (four) hours as needed for severe pain (breakthrough pain). 05/20/15   Loren Racer, MD  levothyroxine (SYNTHROID, LEVOTHROID) 112 MCG tablet Take 112 mcg by mouth daily before breakfast.    Historical Provider, MD  lisinopril-hydrochlorothiazide (PRINZIDE,ZESTORETIC) 20-25 MG per tablet Take 1 tablet by mouth every evening.    Historical Provider, MD  methocarbamol (ROBAXIN) 500 MG tablet Take 1 tablet (500 mg total) by mouth every 6 (six) hours as needed for muscle spasms. 05/14/15   Amber Constable, PA-C  potassium chloride SA (K-DUR,KLOR-CON) 20 MEQ tablet Take 1 tablet (20 mEq total) by mouth 2 (two) times daily. 05/14/15   Amber Celedonio Savage, PA-C  rivaroxaban (XARELTO) 10 MG TABS tablet Take 1 tablet (10 mg total) by mouth daily with breakfast. 05/14/15   Dimitri Ped, PA-C  traMADol (ULTRAM) 50 MG tablet Take 1-2 tablets (50-100 mg total) by mouth every 6 (six) hours as needed for moderate  pain. 05/14/15   Dimitri Ped, PA-C  traZODone (DESYREL) 50 MG tablet Take 100 mg by mouth at bedtime.    Historical Provider, MD   BP 196/67 mmHg  Pulse 91  Temp(Src) 98.2 F (36.8 C)  Resp 28  SpO2 100% Physical Exam  Constitutional: She is oriented to person, place, and time. She appears well-developed and well-nourished. No distress.  HENT:  Head: Normocephalic and atraumatic.  Mouth/Throat: Oropharynx is clear and moist.  Eyes: EOM are normal. Pupils are equal, round, and reactive to light.  Neck: Normal range  of motion. Neck supple.  Cardiovascular: Normal rate and regular rhythm.   Pulmonary/Chest: Effort normal and breath sounds normal. No respiratory distress. She has no wheezes. She has no rales. She exhibits no tenderness.  Abdominal: Soft. Bowel sounds are normal. She exhibits no distension and no mass. There is no tenderness. There is no rebound and no guarding.  Musculoskeletal: She exhibits no edema or tenderness.  Shortening right lower extremity compared to left. External rotation. Dorsalis pedis pulses intact bilaterally. Mild right-sided lower sugary swelling compared to left. No calf tenderness. Decreased range of motion to right hip.  Neurological: She is alert and oriented to person, place, and time.  Sensation fully intact bilateral lower extremities. Patient moving toes freely. No noted focal weakness though limited exam due to inability to move right hip.  Skin: Skin is warm and dry. No rash noted. No erythema.  Psychiatric: She has a normal mood and affect. Her behavior is normal.  Nursing note and vitals reviewed.   ED Course  Reduction of dislocation Date/Time: 05/20/2015 12:08 AM Performed by: Loren Racer Authorized by: Ranae Palms, Maresa Morash Consent: Written consent obtained. Risks and benefits: risks, benefits and alternatives were discussed Consent given by: patient Patient identity confirmed: verbally with patient and arm band Time out: Immediately prior to procedure a "time out" was called to verify the correct patient, procedure, equipment, support staff and site/side marked as required. Patient sedated: yes Sedatives: propofol Sedation start date/time: 05/20/2015 12:08 AM Sedation end date/time: 05/20/2015 12:20 AM Patient tolerance: Patient tolerated the procedure well with no immediate complications Comments: Unsuccessful right hip reduction.   Procedural sedation Date/Time: 05/20/2015 1:18 AM Performed by: Loren Racer Authorized by: Loren Racer Consent: Written consent obtained. Patient sedated: yes Sedatives: propofol Sedation start date/time: 05/20/2015 1:18 AM Sedation end date/time: 05/20/2015 1:30 AM Vitals: Vital signs were monitored during sedation. Patient tolerance: Patient tolerated the procedure well with no immediate complications   (including critical care time) DIAGNOSTIC STUDIES: Oxygen Saturation is 100% on RA, normal by my interpretation.    COORDINATION OF CARE: 11:16 PM Discussed treatment plan with pt at bedside and pt agreed to plan.   Labs Review Labs Reviewed - No data to display  Imaging Review Dg Hip Port Unilat With Pelvis 1v Right  05/20/2015   CLINICAL DATA:  Postreduction.  Initial encounter  EXAM: DG HIP (WITH OR WITHOUT PELVIS) 1V PORT RIGHT  COMPARISON:  05/20/2015 at 0021 hours  FINDINGS: Right hip prosthesis has been relocated in the frontal projection. No periprosthetic fracture.  IMPRESSION: Relocated right hip prosthesis.   Electronically Signed   By: Marnee Spring M.D.   On: 05/20/2015 02:02   Dg Hip Port Unilat With Pelvis 1v Right  05/20/2015   CLINICAL DATA:  Postreduction  EXAM: DG HIP (WITH OR WITHOUT PELVIS) 1V PORT RIGHT  COMPARISON:  Pelvis radiography from yesterday  FINDINGS: Persistent superior dislocation of the prosthetic right hip. No periprosthetic  fracture.  IMPRESSION: Persistent dislocation of the prosthetic right hip.   Electronically Signed   By: Marnee Spring M.D.   On: 05/20/2015 01:03   Dg Hip Unilat With Pelvis 2-3 Views Right  05/19/2015   CLINICAL DATA:  Felt pop while being transferred from chair to bed, with right hip pain. Initial encounter.  EXAM: DG HIP (WITH OR WITHOUT PELVIS) 2-3V RIGHT  COMPARISON:  Right hip radiographs performed 05/12/2015  FINDINGS: There is superior dislocation of the ball of the patient's right hip prosthesis. The acetabular component is grossly stable in appearance. There is no definite evidence of fracture.  The sacroiliac  joints are grossly unremarkable. The proximal right femur appears intact.  IMPRESSION: Superior dislocation of the ball of the patient's right hip prosthesis. No evidence of fracture.   Electronically Signed   By: Roanna Raider M.D.   On: 05/19/2015 23:39   I have personally reviewed and evaluated these images and lab results as part of my medical decision-making.   EKG Interpretation None      MDM   Final diagnoses:  Hip dislocation, right, initial encounter    I personally performed the services described in this documentation, which was scribed in my presence. The recorded information has been reviewed and is accurate.  Patient denies any trauma including fall. X-ray with dislocated mechanical hip. We'll attempt to reduce in the emergency department. Will obtain consent.  Unsuccessful reduction attempt. Patient tolerated well. Discussed with Dr. Rennis Chris. Will attempt reduction in the emergency department.  Dr. Rennis Chris successfully reduced right hip in the emergency department under procedural sedation. Patient was placed in knee immobilizer and advised to follow-up with her orthopedist. Return precautions have been given.  Loren Racer, MD 05/20/15 [phone]

## 2015-05-19 NOTE — ED Notes (Signed)
Bed: ZO10 Expected date:  Expected time:  Means of arrival:  Comments: EMS [place] 81 yo female-pain right hip/shortening BP 200/90 Morphine 8 mg-hip replacement 1 week ago

## 2015-05-19 NOTE — ED Notes (Signed)
Pt is presented from [facility], c/o severe right hip pain, probable hip dislocation evidenced by right leg shortening, pt states there was a popping sound as she was being transferred from chair to bed. The said hip had surgery done 2 weeks ago. Pt received 10 mg of Morphine en route from medics. Pain remains 10/10, which she describes as spasming.

## 2015-05-20 ENCOUNTER — Emergency Department (HOSPITAL_COMMUNITY): Payer: Medicare Other

## 2015-05-20 MED ORDER — PROPOFOL 10 MG/ML IV BOLUS
0.5000 mg/kg | Freq: Once | INTRAVENOUS | Status: AC
  Administered 2015-05-20: 34.5 mg via INTRAVENOUS
  Filled 2015-05-20: qty 1

## 2015-05-20 MED ORDER — PROPOFOL 10 MG/ML IV BOLUS
INTRAVENOUS | Status: AC | PRN
  Administered 2015-05-20: 40 mg via INTRAVENOUS

## 2015-05-20 MED ORDER — HYDROCODONE-ACETAMINOPHEN 5-325 MG PO TABS: 1.0000 | ORAL_TABLET | ORAL | Status: DC | PRN

## 2015-05-20 MED ORDER — FENTANYL CITRATE (PF) 100 MCG/2ML IJ SOLN
50.0000 ug | Freq: Once | INTRAMUSCULAR | Status: AC
Start: 2015-05-20 — End: 2015-05-20
  Administered 2015-05-20: 50 ug via INTRAVENOUS
  Filled 2015-05-20: qty 2

## 2015-05-20 MED ORDER — PROPOFOL 10 MG/ML IV BOLUS
INTRAVENOUS | Status: DC | PRN
  Administered 2015-05-20: 50 mg via INTRAVENOUS

## 2015-05-20 MED ORDER — FENTANYL CITRATE (PF) 100 MCG/2ML IJ SOLN
50.0000 ug | Freq: Once | INTRAMUSCULAR | Status: AC
  Administered 2015-05-20: 50 ug via INTRAVENOUS
  Filled 2015-05-20: qty 2

## 2015-05-20 NOTE — Sedation Documentation (Signed)
Patient is resting comfortably. 

## 2015-05-20 NOTE — Discharge Instructions (Signed)
Hip Dislocation °Hip dislocation is the displacement of the "ball" at the head of your thigh bone (femur) from its socket in the hip bone (pelvis). The ball-and-socket structure of the hip joint gives it a lot of stability, while allowing it to move freely. Therefore, a lot of force is required to displace the femur from its socket. A hip dislocation is an emergency. If you believe you have dislocated your hip and cannot move your leg, call for help immediately. Do not try to move. °CAUSES °The most common cause of hip dislocation is motor vehicle accidents. However, force from falls from a height (a ladder or building), injuries from contact sports, or injuries from industrial accidents can be enough to dislocate your hip. °SYMPTOMS °A hip dislocation is very painful. If you have a dislocated hip, you will not be able to move your hip. If you have nerve damage, you may not have feeling in your lower leg, foot, or ankle.  °DIAGNOSIS °Usually, your caregiver can diagnose a hip dislocation by looking at the position of your leg. Generally, X-ray exams are done to check for fractures in your femur or pelvis. The leg of the dislocated hip will appear shorter than the other leg, and your foot will be turned inward. °TREATMENT  °Your caregiver can manipulate your bones back into the joint (reduction). If there are no other complications involved with your dislocation, such as fractures or damage to blood vessels or nerves, this procedure can be done without surgery. Before this procedure, you will be given medicine so that you will not feel pain (anesthetic). Often specialized imaging exams are done after the reduction (magnetic resonance imaging [MRI] or computed tomography [CT]) to check for loose pieces of cartilage or bone in the joint. °If a manual reduction fails or you have nerve damage, damage to your blood vessels, or bone fractures, surgery will be necessary to perform the reduction.  °HOME CARE  INSTRUCTIONS °The following measures can help to reduce pain and speed up the healing process: °· Rest your injured joint. Do not move your joint if it is painful. Also, avoid activities similar to the one that caused your injury. °· Apply ice to your injured joint for 1 to 2 days after your reduction or as directed by your caregiver. Applying ice helps to reduce inflammation and pain. °¨ Put ice in a plastic bag. °¨ Place a towel between your skin and the bag. °¨ Leave the ice on for 15 to 20 minutes at a time, every 2 hours while you are awake. °· Use crutches or a walker as directed by your caregiver. °· Exercise your hip and leg as directed by your caregiver. °· Take over-the-counter or prescription medicine for pain as directed by your caregiver. °SEEK IMMEDIATE MEDICAL CARE IF: °· Your pain becomes worse rather than better. °· You feel like your hip has become dislocated again. °MAKE SURE YOU: °· Understand these instructions. °· Will watch your condition. °· Will get help right away if you are not doing well or get worse. °Document Released: 06/13/2001 Document Revised: 12/11/2011 Document Reviewed: 02/16/2011 °ExitCare® Patient Information ©2015 [employer]. This information is not intended to replace advice given to you by your health care provider. Make sure you discuss any questions you have with your health care provider. ° °

## 2015-05-20 NOTE — Sedation Documentation (Signed)
Medication dose calculated and verified for: Propofol /ml

## 2015-05-20 NOTE — Sedation Documentation (Signed)
Vital signs stable. 

## 2015-05-20 NOTE — Sedation Documentation (Signed)
Family updated as to patient's status.

## 2015-05-20 NOTE — Sedation Documentation (Signed)
Patient denies pain/spasm and is resting comfortably.

## 2015-05-20 NOTE — Consult Note (Signed)
Reason for Consult:right hip dislocation Referring Physician: EDP  HPI: [name] is an 81 y.o. female s/p R THA by Dr. Darrelyn Hillock 8/10, being [facility]itied back to bed this evening at SNF with immediate c/oright hip pain. Has bescribed ongoing moderate RLE swelling since surgery  Past Medical History  Diagnosis Date  . Hypertension   . Arthritis   . Thyroid disease     Past Surgical History  Procedure Laterality Date  . Tonsillectomy    . Tubal ligation    . Breast surgery  1993    L BREAST BX - BENIGN  . Total hip arthroplasty Right 05/12/2015    Procedure: RIGHT TOTAL HIP ARTHROPLASTY;  Surgeon: Ranee Gosselin, MD;  Location: [facility];  Service: Orthopedics;  Laterality: Right;    History reviewed. No pertinent family history.  Social History:  reports that she has never smoked. She does not have any smokeless tobacco history on file. She reports that she drinks alcohol. She reports that she does not use illicit drugs.  Allergies:  Allergies  Allergen Reactions  . Sulfa Antibiotics Other (See Comments)    "like pens and needles all over my body, tingling"--pt states she isnt sure if she is allergic to it because she was on another medication at the same time.     Medications: I have reviewed the patient's current medications.  No results found for this or any previous visit (from the past 48 hour(s)).  Dg Hip Port Unilat With Pelvis 1v Right  05/20/2015   CLINICAL DATA:  Postreduction  EXAM: DG HIP (WITH OR WITHOUT PELVIS) 1V PORT RIGHT  COMPARISON:  Pelvis radiography from yesterday  FINDINGS: Persistent superior dislocation of the prosthetic right hip. No periprosthetic fracture.  IMPRESSION: Persistent dislocation of the prosthetic right hip.   Electronically Signed   By: Marnee Spring M.D.   On: 05/20/2015 01:03   Dg Hip Unilat With Pelvis 2-3 Views Right  05/19/2015   CLINICAL DATA:  Felt pop while being transferred from chair to bed, with right hip pain. Initial  encounter.  EXAM: DG HIP (WITH OR WITHOUT PELVIS) 2-3V RIGHT  COMPARISON:  Right hip radiographs performed 05/12/2015  FINDINGS: There is superior dislocation of the ball of the patient's right hip prosthesis. The acetabular component is grossly stable in appearance. There is no definite evidence of fracture.  The sacroiliac joints are grossly unremarkable. The proximal right femur appears intact.  IMPRESSION: Superior dislocation of the ball of the patient's right hip prosthesis. No evidence of fracture.   Electronically Signed   By: Roanna Raider M.D.   On: 05/19/2015 23:39     Vitals Temp:  [98.2 F (36.8 C)] 98.2 F (36.8 C) (08/17 2240) Pulse Rate:  [72-91] 87 (08/18 0100) Resp:  [12-28] 16 (08/18 0100) BP: (159-196)/(61-87) 182/61 mmHg (08/18 0100) SpO2:  [97 %-100 %] 100 % (08/18 0100) There is no weight on file to calculate BMI.  Physical Exam: right LE foreshortened and internally rotated, diffuse swelling, incision clean and dry. N/V intact distally  Xray demonstrates superior dislocation R THA   Assessment/Plan: Impression: dislocated RTHA Treatment: closed reduction with IV sedation  Chantell Kunkler M Atley [place] 05/20/2015, 1:32 AM  Contact # [phone]

## 2015-05-20 NOTE — H&P (Signed)
[name], [name]             ACCOUNT NO.:  [id number]  MEDICAL RECORD NO.:  [id number]  LOCATION:  WOTF                         FACILITY:  [facility]  PHYSICIAN:  Vania Rea. Tirrell Buchberger, M.D.  DATE OF BIRTH:  07-03-1933  DATE OF ADMISSION:  05/19/2015 DATE OF DISCHARGE:  05/20/2015                             HISTORY & PHYSICAL   POSTOPERATIVE DIAGNOSIS:  Dislocated right total hip arthroplasty.  POSTOPERATIVE DIAGNOSIS:  Dislocated right total hip arthroplasty.  PROCEDURE:  Closed reduction with IV sedation of dislocated right total hip arthroplasty.  SURGEON:  Vania Rea. Keslyn Teater, M.D.  ASSISTANT:  None.  ANESTHESIA:  IV sedation administered by the emergency department staff.  HISTORY:  [name] is an 81 year old female, who underwent a right total arthroplasty by Dr. Darrelyn Hillock back on May 12, 2015, who has been convalescing at a local skilled nursing facility.  She had been out of bed earlier this evening.  She had some difficulty maneuvering, getting back in bed, was assisted by the staff and apparently right lower extremity positions as to which she felt immediate onset of right hip pain with foreshortening of the extremity.  She is brought to the emergency room, was brought in EMS, where examination showed a foreshortened and rotated right lower extremity.  On my evaluation, she was noted to be grossly neurovascular intact with intact ankle dorsi and plantar flexors, some generalized swelling of the extremity as would be expected.  Postoperative incision however was found to be intact, clean and dry.  No erythema or induration.  Radiographs did confirm a superior dislocation of right total hip arthroplasty.  Plan is now for attempted close reduction with IV sedation.  Preoperatively counseled [name] regarding treatment options and risks versus benefits thereof.  Possible complications reviewed including inability to reduce the hip, periarticular fracture,  and redislocation.  She understands and accepts and agrees.  PROCEDURE IN DETAIL:  The patient had been stabilized in the ED.  She was given appropriate dosage of IV propofol and after appropriate level of relaxation was achieved, I performed a reduction new maneuver, wrapping a sheath about the distal thigh just above the knee and providing gentle longitudinal traction and internal rotation.  The extremity visible or palpable reduction of the hip was performed upon this.  The leg came in back to full length and had a stable full extension and gentle rotation.  Flexion to approximately 45 degrees.  No obvious evidence for instability.  Clinically hip repair reduced and repeat x-rays were pending.  Plan at this time is for placement of a knee immobilizer at the right lower extremity.  Following total hip precautions, she may be weight bearing as tolerated.  We will otherwise have her keep her scheduled follow up with Dr. Darrelyn Hillock.     Vania Rea. Kellye Mizner, M.D.     KMS/MEDQ  D:  05/20/2015  T:  05/20/2015  Job:  [id number]

## 2015-05-20 NOTE — Op Note (Signed)
*   No surgery found *  1:37 AM  PATIENT:   [name]  81 y.o. female  PRE-OPERATIVE DIAGNOSIS:  Dislocated right THA  POST-OPERATIVE DIAGNOSIS:  same  PROCEDURE:  Closed reduction with IV sedation  SURGEON:  Arran Fessel, Vania Rea M.D.  ASSISTANTS: none   ANESTHESIA:   IV sedation  EBL: none  SPECIMEN:  none  Drains: none   PATIENT DISPOSITION:  stable in ED    PLAN OF CARE: D/C to home when stable, knee immobilizer to RLE, total hip precautions, WBAT RLE, f/u with Dr Darrelyn Hillock at previously scheduled appt  Dictation# [id number]   Contact # [phone]

## 2015-05-25 DIAGNOSIS — Z471 Aftercare following joint replacement surgery: Secondary | ICD-10-CM | POA: Diagnosis not present

## 2015-05-25 DIAGNOSIS — Z96641 Presence of right artificial hip joint: Secondary | ICD-10-CM | POA: Diagnosis not present

## 2015-06-04 DIAGNOSIS — Z96641 Presence of right artificial hip joint: Secondary | ICD-10-CM | POA: Diagnosis not present

## 2015-06-08 DIAGNOSIS — Z471 Aftercare following joint replacement surgery: Secondary | ICD-10-CM | POA: Diagnosis not present

## 2015-06-08 DIAGNOSIS — Z96641 Presence of right artificial hip joint: Secondary | ICD-10-CM | POA: Diagnosis not present

## 2015-06-20 ENCOUNTER — Encounter (HOSPITAL_COMMUNITY)
Admission: EM | Disposition: A | Discharge status: Home / Self Care | Payer: Self-pay | Source: Home / Self Care | Attending: Emergency Medicine

## 2015-06-20 ENCOUNTER — Observation Stay (HOSPITAL_COMMUNITY): Payer: Medicare Other

## 2015-06-20 ENCOUNTER — Observation Stay (HOSPITAL_COMMUNITY)
Admission: EM | Admit: 2015-06-20 | Discharge: 2015-06-21 | Disposition: A | Discharge status: Home / Self Care | Payer: Medicare Other | Attending: Orthopedic Surgery | Admitting: Orthopedic Surgery

## 2015-06-20 ENCOUNTER — Emergency Department (HOSPITAL_COMMUNITY): Payer: Medicare Other | Admitting: Anesthesiology

## 2015-06-20 ENCOUNTER — Emergency Department (HOSPITAL_COMMUNITY): Payer: Medicare Other

## 2015-06-20 ENCOUNTER — Encounter (HOSPITAL_COMMUNITY): Payer: Self-pay | Admitting: Emergency Medicine

## 2015-06-20 DIAGNOSIS — Z9889 Other specified postprocedural states: Secondary | ICD-10-CM

## 2015-06-20 DIAGNOSIS — T84020A Dislocation of internal right hip prosthesis, initial encounter: Principal | ICD-10-CM | POA: Insufficient documentation

## 2015-06-20 DIAGNOSIS — S73004A Unspecified dislocation of right hip, initial encounter: Secondary | ICD-10-CM | POA: Diagnosis not present

## 2015-06-20 DIAGNOSIS — Z7901 Long term (current) use of anticoagulants: Secondary | ICD-10-CM | POA: Insufficient documentation

## 2015-06-20 DIAGNOSIS — Z96641 Presence of right artificial hip joint: Secondary | ICD-10-CM | POA: Diagnosis not present

## 2015-06-20 DIAGNOSIS — M1990 Unspecified osteoarthritis, unspecified site: Secondary | ICD-10-CM | POA: Insufficient documentation

## 2015-06-20 DIAGNOSIS — E876 Hypokalemia: Secondary | ICD-10-CM | POA: Insufficient documentation

## 2015-06-20 DIAGNOSIS — Y792 Prosthetic and other implants, materials and accessory orthopedic devices associated with adverse incidents: Secondary | ICD-10-CM | POA: Diagnosis not present

## 2015-06-20 DIAGNOSIS — I10 Essential (primary) hypertension: Secondary | ICD-10-CM | POA: Insufficient documentation

## 2015-06-20 DIAGNOSIS — Z79899 Other long term (current) drug therapy: Secondary | ICD-10-CM | POA: Insufficient documentation

## 2015-06-20 DIAGNOSIS — E079 Disorder of thyroid, unspecified: Secondary | ICD-10-CM | POA: Insufficient documentation

## 2015-06-20 DIAGNOSIS — T84029A Dislocation of unspecified internal joint prosthesis, initial encounter: Secondary | ICD-10-CM | POA: Diagnosis not present

## 2015-06-20 DIAGNOSIS — Z471 Aftercare following joint replacement surgery: Secondary | ICD-10-CM | POA: Diagnosis not present

## 2015-06-20 DIAGNOSIS — T148 Other injury of unspecified body region: Secondary | ICD-10-CM | POA: Diagnosis not present

## 2015-06-20 DIAGNOSIS — M25551 Pain in right hip: Secondary | ICD-10-CM | POA: Diagnosis not present

## 2015-06-20 DIAGNOSIS — Z01818 Encounter for other preprocedural examination: Secondary | ICD-10-CM | POA: Diagnosis not present

## 2015-06-20 HISTORY — PX: HIP CLOSED REDUCTION: SHX983

## 2015-06-20 LAB — BASIC METABOLIC PANEL
ANION GAP: 7 (ref 5–15)
BUN: 10 mg/dL (ref 6–20)
CHLORIDE: 102 mmol/L (ref 101–111)
CO2: 24 mmol/L (ref 22–32)
CREATININE: 0.74 mg/dL (ref 0.44–1.00)
Calcium: 8.5 mg/dL — ABNORMAL LOW (ref 8.9–10.3)
GFR calc non Af Amer: >60 mL/min (ref >60)
Glucose, Bld: 120 mg/dL — ABNORMAL HIGH (ref 65–99)
Potassium: 3.4 mmol/L — ABNORMAL LOW (ref 3.5–5.1)
Sodium: 133 mmol/L — ABNORMAL LOW (ref 135–145)

## 2015-06-20 LAB — CBC WITH DIFFERENTIAL/PLATELET
BASOS PCT: 0 %
Basophils Absolute: 0 10*3/uL (ref 0.0–0.1)
Eosinophils Absolute: 0 10*3/uL (ref 0.0–0.7)
Eosinophils Relative: 0 %
HEMATOCRIT: 28.4 % — AB (ref 36.0–46.0)
HEMOGLOBIN: 9.7 g/dL — AB (ref 12.0–15.0)
LYMPHS PCT: 22 %
Lymphs Abs: 1.8 10*3/uL (ref 0.7–4.0)
MCH: 29.8 pg (ref 26.0–34.0)
MCHC: 34.2 g/dL (ref 30.0–36.0)
MCV: 87.4 fL (ref 78.0–100.0)
MONOS PCT: 6 %
Monocytes Absolute: 0.5 10*3/uL (ref 0.1–1.0)
NEUTROS ABS: 5.7 10*3/uL (ref 1.7–7.7)
NEUTROS PCT: 72 %
Platelets: 239 10*3/uL (ref 150–400)
RBC: 3.25 MIL/uL — ABNORMAL LOW (ref 3.87–5.11)
RDW: 12.9 % (ref 11.5–15.5)
WBC: 8 10*3/uL (ref 4.0–10.5)

## 2015-06-20 SURGERY — CLOSED REDUCTION, HIP
Anesthesia: General | Site: Hip | Laterality: Right

## 2015-06-20 SURGERY — CLOSED REDUCTION, HIP
Anesthesia: General | Laterality: Right

## 2015-06-20 MED ORDER — ONDANSETRON HCL 4 MG/2ML IJ SOLN
4.0000 mg | Freq: Once | INTRAMUSCULAR | Status: AC
  Administered 2015-06-20: 4 mg via INTRAVENOUS
  Filled 2015-06-20: qty 2

## 2015-06-20 MED ORDER — HYDROCODONE-ACETAMINOPHEN 5-325 MG PO TABS: 1.0000 | ORAL_TABLET | Freq: Once | ORAL | Status: DC

## 2015-06-20 MED ORDER — ALUM & MAG HYDROXIDE-SIMETH 200-200-20 MG/5ML PO SUSP: 30.0000 mL | ORAL | Status: DC | PRN

## 2015-06-20 MED ORDER — GLYCOPYRROLATE 0.2 MG/ML IJ SOLN
INTRAMUSCULAR | Status: AC
  Filled 2015-06-20: qty 1

## 2015-06-20 MED ORDER — MIDAZOLAM HCL 5 MG/5ML IJ SOLN
INTRAMUSCULAR | Status: DC | PRN
  Administered 2015-06-20: 0.5 mg via INTRAVENOUS

## 2015-06-20 MED ORDER — ONDANSETRON HCL 4 MG/2ML IJ SOLN: 4.0000 mg | Freq: Four times a day (QID) | INTRAMUSCULAR | Status: DC | PRN

## 2015-06-20 MED ORDER — HYDROCHLOROTHIAZIDE 25 MG PO TABS
25.0000 mg | ORAL_TABLET | Freq: Every day | ORAL | Status: DC
  Administered 2015-06-20: 25 mg via ORAL
  Filled 2015-06-20: qty 1

## 2015-06-20 MED ORDER — SODIUM CHLORIDE 0.9 % IJ SOLN
INTRAMUSCULAR | Status: AC
  Filled 2015-06-20: qty 10

## 2015-06-20 MED ORDER — PHENOL 1.4 % MT LIQD: 1.0000 | OROMUCOSAL | Status: DC | PRN

## 2015-06-20 MED ORDER — ARTIFICIAL TEARS OP OINT
TOPICAL_OINTMENT | OPHTHALMIC | Status: AC
  Filled 2015-06-20: qty 3.5

## 2015-06-20 MED ORDER — LIDOCAINE HCL (CARDIAC) 20 MG/ML IV SOLN
INTRAVENOUS | Status: DC | PRN
  Administered 2015-06-20: 25 mg via INTRAVENOUS

## 2015-06-20 MED ORDER — POTASSIUM CHLORIDE 10 MEQ/100ML IV SOLN
10.0000 meq | Freq: Once | INTRAVENOUS | Status: AC
  Administered 2015-06-20: 10 meq via INTRAVENOUS
  Filled 2015-06-20: qty 100

## 2015-06-20 MED ORDER — TRAMADOL HCL 50 MG PO TABS: 50.0000 mg | ORAL_TABLET | Freq: Four times a day (QID) | ORAL | Status: DC | PRN

## 2015-06-20 MED ORDER — POTASSIUM CHLORIDE CRYS ER 20 MEQ PO TBCR
20.0000 meq | EXTENDED_RELEASE_TABLET | Freq: Two times a day (BID) | ORAL | Status: DC
  Administered 2015-06-20 – 2015-06-21 (×2): 20 meq via ORAL
  Filled 2015-06-20 (×3): qty 1

## 2015-06-20 MED ORDER — SODIUM CHLORIDE 0.9 % IV SOLN: INTRAVENOUS | Status: DC

## 2015-06-20 MED ORDER — SUCCINYLCHOLINE CHLORIDE 20 MG/ML IJ SOLN
INTRAMUSCULAR | Status: DC | PRN
  Administered 2015-06-20: 100 mg via INTRAVENOUS

## 2015-06-20 MED ORDER — PROPOFOL 10 MG/ML IV BOLUS
1.0000 mg/kg | Freq: Once | INTRAVENOUS | Status: AC
  Administered 2015-06-20: 71.7 mg via INTRAVENOUS
  Filled 2015-06-20: qty 20

## 2015-06-20 MED ORDER — METHOCARBAMOL 500 MG PO TABS: 500.0000 mg | ORAL_TABLET | Freq: Four times a day (QID) | ORAL | Status: DC | PRN

## 2015-06-20 MED ORDER — PROPOFOL 10 MG/ML IV BOLUS
INTRAVENOUS | Status: DC | PRN
  Administered 2015-06-20: 100 mg via INTRAVENOUS

## 2015-06-20 MED ORDER — LISINOPRIL-HYDROCHLOROTHIAZIDE 20-25 MG PO TABS: 1.0000 | ORAL_TABLET | Freq: Every evening | ORAL | Status: DC

## 2015-06-20 MED ORDER — HYDROCODONE-ACETAMINOPHEN 5-325 MG PO TABS: 1.0000 | ORAL_TABLET | ORAL | Status: DC | PRN

## 2015-06-20 MED ORDER — LEVOTHYROXINE SODIUM 112 MCG PO TABS
112.0000 ug | ORAL_TABLET | Freq: Every day | ORAL | Status: DC
  Administered 2015-06-21: 112 ug via ORAL
  Filled 2015-06-20: qty 1

## 2015-06-20 MED ORDER — PROPOFOL 10 MG/ML IV BOLUS
INTRAVENOUS | Status: AC
  Filled 2015-06-20: qty 20

## 2015-06-20 MED ORDER — LISINOPRIL 10 MG PO TABS
20.0000 mg | ORAL_TABLET | Freq: Every day | ORAL | Status: DC
  Administered 2015-06-20: 20 mg via ORAL
  Filled 2015-06-20: qty 2

## 2015-06-20 MED ORDER — ONDANSETRON HCL 4 MG PO TABS
4.0000 mg | ORAL_TABLET | Freq: Four times a day (QID) | ORAL | Status: DC | PRN
Start: 2015-06-20 — End: 2015-06-21

## 2015-06-20 MED ORDER — METOCLOPRAMIDE HCL 5 MG/ML IJ SOLN: 5.0000 mg | Freq: Three times a day (TID) | INTRAMUSCULAR | Status: DC | PRN

## 2015-06-20 MED ORDER — METOCLOPRAMIDE HCL 10 MG PO TABS: 5.0000 mg | ORAL_TABLET | Freq: Three times a day (TID) | ORAL | Status: DC | PRN

## 2015-06-20 MED ORDER — MENTHOL 3 MG MT LOZG: 1.0000 | LOZENGE | OROMUCOSAL | Status: DC | PRN

## 2015-06-20 MED ORDER — SODIUM CHLORIDE 0.9 % IV SOLN
INTRAVENOUS | Status: DC
  Administered 2015-06-20 (×2): via INTRAVENOUS

## 2015-06-20 MED ORDER — EPHEDRINE SULFATE 50 MG/ML IJ SOLN
INTRAMUSCULAR | Status: AC
  Filled 2015-06-20: qty 1

## 2015-06-20 MED ORDER — FENTANYL CITRATE (PF) 100 MCG/2ML IJ SOLN
INTRAMUSCULAR | Status: AC
  Filled 2015-06-20: qty 4

## 2015-06-20 MED ORDER — MIDAZOLAM HCL 2 MG/2ML IJ SOLN
INTRAMUSCULAR | Status: AC
  Filled 2015-06-20: qty 4

## 2015-06-20 MED ORDER — FENTANYL CITRATE (PF) 100 MCG/2ML IJ SOLN
50.0000 ug | Freq: Once | INTRAMUSCULAR | Status: AC
  Administered 2015-06-20: 50 ug via INTRAVENOUS
  Filled 2015-06-20: qty 2

## 2015-06-20 MED ORDER — LIDOCAINE HCL (PF) 1 % IJ SOLN
INTRAMUSCULAR | Status: AC
  Filled 2015-06-20: qty 5

## 2015-06-20 MED ORDER — IBUPROFEN 800 MG PO TABS: 800.0000 mg | ORAL_TABLET | Freq: Four times a day (QID) | ORAL | Status: DC | PRN

## 2015-06-20 MED ORDER — ONDANSETRON HCL 4 MG/2ML IJ SOLN
INTRAMUSCULAR | Status: DC | PRN
  Administered 2015-06-20: 4 mg via INTRAVENOUS

## 2015-06-20 MED ORDER — SUCCINYLCHOLINE CHLORIDE 20 MG/ML IJ SOLN
INTRAMUSCULAR | Status: AC
  Filled 2015-06-20: qty 1

## 2015-06-20 MED ORDER — TRAZODONE HCL 50 MG PO TABS
100.0000 mg | ORAL_TABLET | Freq: Every day | ORAL | Status: DC
  Administered 2015-06-20: 100 mg via ORAL
  Filled 2015-06-20: qty 2

## 2015-06-20 MED ORDER — SODIUM CHLORIDE 0.9 % IV SOLN
INTRAVENOUS | Status: DC
  Administered 2015-06-20: 13:00:00 via INTRAVENOUS

## 2015-06-20 MED ORDER — FENTANYL CITRATE (PF) 100 MCG/2ML IJ SOLN
INTRAMUSCULAR | Status: DC | PRN
  Administered 2015-06-20 (×2): 25 ug via INTRAVENOUS

## 2015-06-20 MED ORDER — HYDROMORPHONE HCL 1 MG/ML IJ SOLN
0.5000 mg | INTRAMUSCULAR | Status: DC | PRN
Start: 2015-06-20 — End: 2015-06-21

## 2015-06-20 MED ORDER — OXYCODONE-ACETAMINOPHEN 5-325 MG PO TABS: 1.0000 | ORAL_TABLET | ORAL | Status: DC | PRN

## 2015-06-20 SURGICAL SUPPLY — 6 items
CLOTH BEACON ORANGE TIMEOUT ST (SAFETY) ×3 IMPLANT
IMMOBILIZER KNEE 19 UNV (ORTHOPEDIC SUPPLIES) ×3 IMPLANT
KIT ROOM TURNOVER APOR (KITS) ×3 IMPLANT
PILLOW HIP ABDUCTION LRG (ORTHOPEDIC SUPPLIES) IMPLANT
PILLOW HIP ABDUCTION MED (ORTHOPEDIC SUPPLIES) IMPLANT
PILLOW HIP ABDUCTION SM (ORTHOPEDIC SUPPLIES) IMPLANT

## 2015-06-20 NOTE — ED Notes (Addendum)
Pt transferred to OR via stretcher by Sherlie Ban, CRNA and Terrilyn Saver, OR RN. Pt's clothes, blankets and personal pillow given to pt's sister.

## 2015-06-20 NOTE — Anesthesia Procedure Notes (Signed)
Procedure Name: Intubation Date/Time: 06/20/2015 8:13 AM Performed by: Pernell Dupre, AMY A Pre-anesthesia Checklist: Patient identified, Patient being monitored, Timeout performed, Emergency Drugs available and Suction available Patient Re-evaluated:Patient Re-evaluated prior to inductionOxygen Delivery Method: Circle System Utilized Preoxygenation: Pre-oxygenation with 100% oxygen Intubation Type: IV induction, Cricoid Pressure applied and Rapid sequence Ventilation: Mask ventilation without difficulty Laryngoscope Size: 3 and Miller Grade View: Grade I Tube type: Oral Tube size: 7.0 mm Number of attempts: 1 Airway Equipment and Method: Stylet Placement Confirmation: ETT inserted through vocal cords under direct vision,  positive ETCO2 and breath sounds checked- equal and bilateral Secured at: 21 cm Tube secured with: Tape Dental Injury: Teeth and Oropharynx as per pre-operative assessment

## 2015-06-20 NOTE — Anesthesia Postprocedure Evaluation (Signed)
  Anesthesia Post-op Note  Patient: [name]  Procedure(s) Performed: Procedure(s): CLOSED REDUCTION RIGHT HIP (Right)  Patient Location: PACU  Anesthesia Type:General  Level of Consciousness: awake, alert , oriented and patient cooperative  Airway and Oxygen Therapy: Patient Spontanous Breathing and Patient connected to nasal cannula oxygen  Post-op Pain: none  Post-op Assessment: Post-op Vital signs reviewed, Patient's Cardiovascular Status Stable, Respiratory Function Stable, Patent Airway, No signs of Nausea or vomiting and Pain level controlled              Post-op Vital Signs: Reviewed and stable  Last Vitals:  Filed Vitals:   06/20/15 0750  BP: 154/91  Pulse: 82  Temp:   Resp: 15    Complications: No apparent anesthesia complications

## 2015-06-20 NOTE — ED Notes (Signed)
Conscious sedation completed, pt tolerated well. EDP made attempt to reduce right hip. Waiting for portable xray to access procedure placement.

## 2015-06-20 NOTE — ED Notes (Signed)
Pt had hip replacement on august 6, has had right leg shorter since sx with leg turned in. Pt fell tonight while going to toilet and now has paint to right leg.

## 2015-06-20 NOTE — Anesthesia Preprocedure Evaluation (Addendum)
Anesthesia Evaluation  Patient identified by MRN, date of birth, ID band Patient awake    Reviewed: Allergy & Precautions, H&P , NPO status , Patient's Chart, lab work & pertinent test results  Airway Mallampati: II  TM Distance: >3 FB Neck ROM: full    Dental  (+) Teeth Intact   Pulmonary    breath sounds clear to auscultation       Cardiovascular hypertension, On Medications  Rhythm:regular Rate:Normal     Neuro/Psych    GI/Hepatic   Endo/Other    Renal/GU      Musculoskeletal   Abdominal   Peds  Hematology   Anesthesia Other Findings Late entry  Reproductive/Obstetrics                            Anesthesia Physical Anesthesia Plan  ASA: II and emergent  Anesthesia Plan: Modified Rapid Sequence, Cricoid Pressure and General   Post-op Pain Management:    Induction: Intravenous, Rapid sequence and Cricoid pressure planned  Airway Management Planned: Oral ETT  Additional Equipment:   Intra-op Plan:   Post-operative Plan: Extubation in OR  Informed Consent:   Dental Advisory Given  Plan Discussed with: Surgeon  Anesthesia Plan Comments:        Anesthesia Quick Evaluation

## 2015-06-20 NOTE — Transfer of Care (Signed)
Immediate Anesthesia Transfer of Care Note  Patient: [name]  Procedure(s) Performed: Procedure(s): CLOSED REDUCTION RIGHT HIP (Right)  Patient Location: PACU  Anesthesia Type:General  Level of Consciousness: awake, alert , oriented and patient cooperative  Airway & Oxygen Therapy: Patient Spontanous Breathing and Patient connected to nasal cannula oxygen  Post-op Assessment: Report given to RN and Post -op Vital signs reviewed and stable  Post vital signs: Reviewed and stable  Last Vitals:  Filed Vitals:   06/20/15 0750  BP: 154/91  Pulse: 82  Temp:   Resp: 15    Complications: No apparent anesthesia complications

## 2015-06-20 NOTE — Consult Note (Signed)
Physical Exam  Nursing note and vitals reviewed. Constitutional: She is oriented to person, place, and time. She appears well-developed and well-nourished.  HENT:  Head: Normocephalic and atraumatic.  Eyes: Pupils are equal, round, and reactive to light.  Neck: Normal range of motion.  Cardiovascular: Normal rate and regular rhythm.   Respiratory: No respiratory distress.  GI: There is no rebound.  Musculoskeletal:       Right hip: She exhibits decreased range of motion, tenderness and deformity. She exhibits no swelling and no crepitus.       Right upper arm: She exhibits no tenderness.       Left upper arm: Normal.       Left upper leg: Normal.       Legs: Neurological: She is alert and oriented to person, place, and time. No sensory deficit. She exhibits normal muscle tone.  Skin: Skin is warm and dry. No erythema.  Psychiatric: She has a normal mood and affect. Her behavior is normal. Judgment and thought content normal.

## 2015-06-20 NOTE — ED Notes (Signed)
Dr Harrison at bedside

## 2015-06-20 NOTE — Brief Op Note (Signed)
06/20/2015  8:35 AM  PATIENT:  [name]  81 y.o. female  PRE-OPERATIVE DIAGNOSIS:  dislocated right hip prosthesis  POST-OPERATIVE DIAGNOSIS:  dislocated right hip prosthesis  Indications for surgery the patient had an attempted closed reduction in the ER under propofol sedation could not be reduced. Rather than continue damaging the ceramic head of this prosthesis and putting her under further IV sedation where she became apneic according to the ER physician thought it better to have controlled airway to do the reduction and also I felt that we needed some muscle relaxation due to the time that the hip was out of place.  The procedures done as follows site marking was done in the ER. Patient was brought to the OR. General anesthesia and succinylcholine was administered. The hip was reduced with flexion and adduction in traction countertraction with the patient supine position. Clunk was noted. Radiographs were obtained. Hip look reduced. Leg length brought out to normal. Upon flexion abduction flexion internal rotation I actually could not get the hip back out. However I was concerned that we only had a unilateral view and asked for crosstable lateral in the PACU details to follow  PROCEDURE:  Procedure(s): CLOSED REDUCTION RIGHT HIP (Right)  SURGEON:  Surgeon(s) and Role:    * Stanley E Harrison, MD - Primary  PHYSICIAN ASSISTANT:   ASSISTANTS: none   ANESTHESIA:   general  EBL:  Total I/O In: 100 [IV Piggyback:100] Out: -   BLOOD ADMINISTERED:none  DRAINS: none   LOCAL MEDICATIONS USED:  NONE  SPECIMEN:  No Specimen  DISPOSITION OF SPECIMEN:  N/A  COUNTS:  YES  TOURNIQUET:  * No tourniquets in log *  DICTATION: .Dragon Dictation  PLAN OF CARE: Admit for overnight observation  PATIENT DISPOSITION:  PACU - hemodynamically stable.   Delay start of Pharmacological VTE agent (>24hrs) due to surgical blood loss or risk of bleeding: yes  

## 2015-06-20 NOTE — ED Provider Notes (Signed)
TIME SEEN: 1:40 AM  CHIEF COMPLAINT: Right hip pain  HPI: Pt is a 81 y.o. female with history of hypertension, hypothyroidism who recently had a right total hip arthroplasty by Dr. Darrelyn Hillock on 05/12/15 and then had a subsequent hip dislocation on 05/19/15 which was successfully reduce by orthopedic physician Dr. supple in the emergency Department at [facility] who presents to the emergency department today with right hip pain. States that she was walking to the bathroom and did not turn on the light and when she went to sit on the toilet she missed the toilet and fell to the ground. Did not hit her head or lose consciousness. He is complaining of pain to the right femur. States pain is worse with trying to lift her leg. She has not been ambulatory since. She normally ambulates with a walker. She lives at home and her sister has been living with her. Denies any other injury. No numbness, tingling or focal weakness. Her leg appears short and and internally rotated but she states this is chronic and does not appear different than it normally does. States this does not feel as bad as when her hip was dislocated. Nothing by mouth since 8 PM last night. Allergic to sulfa.   ROS: See HPI Constitutional: no fever  Eyes: no drainage  ENT: no runny nose   Cardiovascular:  no chest pain  Resp: no SOB  GI: no vomiting GU: no dysuria Integumentary: no rash  Allergy: no hives  Musculoskeletal: no leg swelling  Neurological: no slurred speech ROS otherwise negative  PAST MEDICAL HISTORY/PAST SURGICAL HISTORY:  Past Medical History  Diagnosis Date  . Hypertension   . Arthritis   . Thyroid disease     MEDICATIONS:  Prior to Admission medications   Medication Sig Start Date End Date Taking? Authorizing Provider  HYDROcodone-acetaminophen (NORCO/VICODIN) 5-325 MG per tablet Take 1-2 tablets by mouth every 4 (four) hours as needed for severe pain (breakthrough pain). 05/20/15   Loren Racer, MD   levothyroxine (SYNTHROID, LEVOTHROID) 112 MCG tablet Take 112 mcg by mouth daily before breakfast.    Historical Provider, MD  lisinopril-hydrochlorothiazide (PRINZIDE,ZESTORETIC) 20-25 MG per tablet Take 1 tablet by mouth every evening.    Historical Provider, MD  methocarbamol (ROBAXIN) 500 MG tablet Take 1 tablet (500 mg total) by mouth every 6 (six) hours as needed for muscle spasms. 05/14/15   Amber Constable, PA-C  potassium chloride SA (K-DUR,KLOR-CON) 20 MEQ tablet Take 1 tablet (20 mEq total) by mouth 2 (two) times daily. 05/14/15   Amber Celedonio Savage, PA-C  rivaroxaban (XARELTO) 10 MG TABS tablet Take 1 tablet (10 mg total) by mouth daily with breakfast. 05/14/15   Dimitri Ped, PA-C  traMADol (ULTRAM) 50 MG tablet Take 1-2 tablets (50-100 mg total) by mouth every 6 (six) hours as needed for moderate pain. 05/14/15   Amber Celedonio Savage, PA-C  traZODone (DESYREL) 50 MG tablet Take 100 mg by mouth at bedtime.    Historical Provider, MD    ALLERGIES:  Allergies  Allergen Reactions  . Sulfa Antibiotics Other (See Comments)    "like pens and needles all over my body, tingling"--pt states she isnt sure if she is allergic to it because she was on another medication at the same time.     SOCIAL HISTORY:  Social History  Substance Use Topics  . Smoking status: Never Smoker   . Smokeless tobacco: Not on file  . Alcohol Use: Yes     Comment: RARE  FAMILY HISTORY: History reviewed. No pertinent family history.  EXAM: BP 156/75 mmHg  Pulse 74  Temp(Src) 97.7 F (36.5 C) (Oral)  Resp 20  Ht  (1.651 m)  Wt 158 lb (71.668 kg)  BMI 26.29 kg/m2  SpO2 99% CONSTITUTIONAL: Alert and oriented and responds appropriately to questions. Well-appearing; well-nourished; GCS 15 HEAD: Normocephalic; atraumatic EYES: Conjunctivae clear, PERRL, EOMI ENT: normal nose; no rhinorrhea; moist mucous membranes; pharynx without lesions noted; no dental injury; no septal hematoma, posterior oropharynx  is widely patent NECK: Supple, no meningismus, no LAD; no midline spinal tenderness, step-off or deformity CARD: RRR; S1 and S2 appreciated; no murmurs, no clicks, no rubs, no gallops RESP: Normal chest excursion without splinting or tachypnea; breath sounds clear and equal bilaterally; no wheezes, no rhonchi, no rales; no hypoxia or respiratory distress CHEST:  chest wall stable, no crepitus or ecchymosis or deformity, nontender to palpation ABD/GI: Normal bowel sounds; non-distended; soft, non-tender, no rebound, no guarding PELVIS:  stable, nontender to palpation BACK:  The back appears normal and is non-tender to palpation, there is no CVA tenderness; no midline spinal tenderness, step-off or deformity EXT: Right leg is internally rotated and shortened, patient has no significant tenderness when palpating her hip, femur but does have some tenderness over the right knee without obvious deformity, patient appears to have a posterior dislocation, 2+ DP pulses bilaterally, unable to flex the right hip secondary to pain, otherwise Normal ROM in all joints; otherwise extremities are non-tender to palpation including no tenderness over the right tibia and fibula, right ankle, right foot; no edema; normal capillary refill; no cyanosis, no bony tenderness or bony deformity of patient's extremities, no joint effusion, no ecchymosis or lacerations    SKIN: Normal color for age and race; warm NEURO: Moves all extremities equally, sensation to light touch intact diffusely, cranial nerves II through XII intact PSYCH: The patient's mood and manner are appropriate. Grooming and personal hygiene are appropriate.  MEDICAL DECISION MAKING: Patient here with right hip injury. Appears to have a posterior dislocation on exam. Neurovascular intact distally. Will obtain x-rays. We'll keep her nothing by mouth and provide pain and nausea medicine. No other sign of injury on exam.  ED PROGRESS: 3:45 AM  Pt's x-ray shows a  right hip dislocation without fracture. Hardware appears to be intact. Have consented patient for procedural sedation using propofol and for right hip reduction.   5:00 AM  I attempted reduction in the emergency department using IV propofol without success. Will consult patient's orthopedist on call.   5:30 AM  D/w Dr. Shon Baton who is on call at [facility] for Dr. Darrelyn Hillock.  He requests that we discuss with our orthopedist at [facility]. Have discussed this with Dr. Romeo Apple here to agrees to see the patient in consult. He is requesting preop labs, chest x-ray and EKG as patient will likely need to go to the operating room for reduction. Discussed this with patient. We'll keep her NPO. She is receiving IV hydration. She declines any further pain medication at this time.  6:30 AM  Pt's screening labs, EKG and chest x-ray are unremarkable. Potassium slightly low at 3.4. Dr. Romeo Apple would like for [place] to give 10 mEq of IV potassium. Plan is to take patient to the operating room for reduction.  Procedural sedation Performed by: Raelyn Number Consent: Verbal consent obtained. Risks and benefits: risks, benefits and alternatives were discussed Required items: required blood products, implants, devices, and special equipment  available Patient identity confirmed: arm band and provided demographic data Time out: Immediately prior to procedure a "time out" was called to verify the correct patient, procedure, equipment, support staff and site/side marked as required.  Sedation type: moderate (conscious) sedation NPO time confirmed and considedered  Sedatives: PROPOFOL (total 1.5 mg/kg IV)  Physician Time at Bedside: Sedation start time for 20 5 AM, sensation intact for 40 a.m.  Vitals: Vital signs were monitored during sedation. Cardiac Monitor, pulse oximeter Patient tolerance: Patient tolerated the procedure well with no immediate complications. Comments: Pt with uneventful  recovered. Returned to pre-procedural sedation baseline   Reduction of dislocation Date/Time: 430 AM Performed by: Raelyn Number Authorized by: Raelyn Number Consent: Verbal consent obtained. Risks and benefits: risks, benefits and alternatives were discussed Consent given by: patient Required items: required blood products, implants, devices, and special equipment available Time out: Immediately prior to procedure a "time out" was called to verify the correct patient, procedure, equipment, support staff and site/side marked as required.  Patient sedated: Using propofol  Vitals: Vital signs were monitored during sedation. Patient tolerance: Patient tolerated the procedure well with no immediate complications. Joint: Right hip, superior dislocation Reduction technique: Traction; reduction was unsuccessful     [employer], DO 06/20/15 0630

## 2015-06-20 NOTE — ED Notes (Signed)
Report given to Sherlie Ban, CRNA. OR team expected within next 30-45 minutes.

## 2015-06-20 NOTE — Op Note (Signed)
06/20/2015  8:35 AM  PATIENT:  [name]  81 y.o. female  PRE-OPERATIVE DIAGNOSIS:  dislocated right hip prosthesis  POST-OPERATIVE DIAGNOSIS:  dislocated right hip prosthesis  Indications for surgery the patient had an attempted closed reduction in the ER under propofol sedation could not be reduced. Rather than continue damaging the ceramic head of this prosthesis and putting her under further IV sedation where she became apneic according to the ER physician thought it better to have controlled airway to do the reduction and also I felt that we needed some muscle relaxation due to the time that the hip was out of place.  The procedures done as follows site marking was done in the ER. Patient was brought to the OR. General anesthesia and succinylcholine was administered. The hip was reduced with flexion and adduction in traction countertraction with the patient supine position. Clunk was noted. Radiographs were obtained. Hip look reduced. Leg length brought out to normal. Upon flexion abduction flexion internal rotation I actually could not get the hip back out. However I was concerned that we only had a unilateral view and asked for crosstable lateral in the PACU details to follow  PROCEDURE:  Procedure(s): CLOSED REDUCTION RIGHT HIP (Right)  SURGEON:  Surgeon(s) and Role:    * Vickki Hearing, MD - Primary  PHYSICIAN ASSISTANT:   ASSISTANTS: none   ANESTHESIA:   general  EBL:  Total I/O In: 100 [IV Piggyback:100] Out: -   BLOOD ADMINISTERED:none  DRAINS: none   LOCAL MEDICATIONS USED:  NONE  SPECIMEN:  No Specimen  DISPOSITION OF SPECIMEN:  N/A  COUNTS:  YES  TOURNIQUET:  * No tourniquets in log *  DICTATION: .Dragon Dictation  PLAN OF CARE: Admit for overnight observation  PATIENT DISPOSITION:  PACU - hemodynamically stable.   Delay start of Pharmacological VTE agent (>24hrs) due to surgical blood loss or risk of bleeding: yes

## 2015-06-20 NOTE — H&P (Signed)
[name] is an 81 y.o. female.   Chief Complaint: irreducible right THA  HPI: 81 yo rt tha 8-10. 1 st dislocation 8-16. Closed reduced on 2nd attempt with propfol. dislcoated again missing the toilet. 1 attempt in ER unsuccessful. C/o mild pain, deformity, without neurovascular compromise   Past Medical History  Diagnosis Date  . Hypertension   . Arthritis   . Thyroid disease     Past Surgical History  Procedure Laterality Date  . Tonsillectomy    . Tubal ligation    . Breast surgery  1993    L BREAST BX - BENIGN  . Total hip arthroplasty Right 05/12/2015    Procedure: RIGHT TOTAL HIP ARTHROPLASTY;  Surgeon: Latanya Maudlin, MD;  Location: [facility];  Service: Orthopedics;  Laterality: Right;    History reviewed. No pertinent family history. Social History:  reports that she has never smoked. She does not have any smokeless tobacco history on file. She reports that she drinks alcohol. She reports that she does not use illicit drugs.  Allergies:  Allergies  Allergen Reactions  . Sulfa Antibiotics Other (See Comments)    "like pens and needles all over my body, tingling"--pt states she isnt sure if she is allergic to it because she was on another medication at the same time.      (Not in a hospital admission)  Results for orders placed or performed during the hospital encounter of 06/20/15 (from the past 48 hour(s))  CBC with Differential     Status: Abnormal   Collection Time: 06/20/15  5:40 AM  Result Value Ref Range   WBC 8.0 4.0 - 10.5 K/uL   RBC 3.25 (L) 3.87 - 5.11 MIL/uL   Hemoglobin 9.7 (L) 12.0 - 15.0 g/dL   HCT 28.4 (L) 36.0 - 46.0 %   MCV 87.4 78.0 - 100.0 fL   MCH 29.8 26.0 - 34.0 pg   MCHC 34.2 30.0 - 36.0 g/dL   RDW 12.9 11.5 - 15.5 %   Platelets 239 150 - 400 K/uL   Neutrophils Relative % 72 %   Neutro Abs 5.7 1.7 - 7.7 K/uL   Lymphocytes Relative 22 %   Lymphs Abs 1.8 0.7 - 4.0 K/uL   Monocytes Relative 6 %   Monocytes Absolute 0.5 0.1 - 1.0 K/uL    Eosinophils Relative 0 %   Eosinophils Absolute 0.0 0.0 - 0.7 K/uL   Basophils Relative 0 %   Basophils Absolute 0.0 0.0 - 0.1 K/uL  Basic metabolic panel     Status: Abnormal   Collection Time: 06/20/15  5:40 AM  Result Value Ref Range   Sodium 133 (L) 135 - 145 mmol/L   Potassium 3.4 (L) 3.5 - 5.1 mmol/L   Chloride 102 101 - 111 mmol/L   CO2 24 22 - 32 mmol/L   Glucose, Bld 120 (H) 65 - 99 mg/dL   BUN 10 6 - 20 mg/dL   Creatinine, Ser 0.74 0.44 - 1.00 mg/dL   Calcium 8.5 (L) 8.9 - 10.3 mg/dL   GFR calc non Af Amer >60 >60 mL/min   GFR calc Af Amer >60 >60 mL/min    Comment: (NOTE) The eGFR has been calculated using the CKD EPI equation. This calculation has not been validated in all clinical situations. eGFR's persistently <60 mL/min signify possible Chronic Kidney Disease.    Anion gap 7 5 - 15   Dg Chest 2 View  06/20/2015   CLINICAL DATA:  Right hip dislocation, preop  chest x-ray.  EXAM: CHEST  2 VIEW  COMPARISON:  05/04/2015  FINDINGS: The cardiomediastinal contours are unchanged, there is tortuosity of thoracic aorta. The lungs remain hyperinflated. Pulmonary vasculature is normal. No consolidation, pleural effusion, or pneumothorax. No acute osseous abnormalities are seen. Degenerative change of both shoulders, left greater than right.  IMPRESSION: Unchanged hyperinflation.  No acute pulmonary process.   Electronically Signed   By: Jeb Levering M.D.   On: 06/20/2015 06:26   Dg Knee Complete 4 Views Right  06/20/2015   CLINICAL DATA:  Fall, right hip pain.  EXAM: RIGHT KNEE - COMPLETE 4+ VIEW  COMPARISON:  None.  FINDINGS: No acute fracture or dislocation. There is chondrocalcinosis in the medial and lateral tibial femoral compartments. Minimal osteoarthritis. The bones are under mineralized. Limited assessment for joint effusion secondary to positioning.  IMPRESSION: No acute bony abnormality. Chondrocalcinosis and mild osteoarthritis.   Electronically Signed   By: Jeb Levering M.D.   On: 06/20/2015 04:09   Dg Hip Port Unilat With Pelvis 1v Right  06/20/2015   CLINICAL DATA:  Dislocation, postreduction.  EXAM: DG HIP (WITH OR WITHOUT PELVIS) 1V PORT RIGHT  COMPARISON:  Earlier this day.  FINDINGS: The femoral component of the right hip arthroplasty remains superiorly dislocated with respect to the acetabular component. No significant change in alignment. No definite acute fracture is seen.  IMPRESSION: Persistent superior dislocation of the femoral component of right hip arthroplasty.   Electronically Signed   By: Jeb Levering M.D.   On: 06/20/2015 05:52   Dg Hip Unilat With Pelvis 2-3 Views Right  06/20/2015   CLINICAL DATA:  Fall tonight.  History of right hip replacement.  EXAM: DG HIP (WITH OR WITHOUT PELVIS) 2-3V RIGHT  COMPARISON:  05/20/2015  FINDINGS: Superior dislocation of the femoral component in relation to the acetabular component of the right hip arthroplasty. No associated fracture. The remainder of the bony pelvis is intact. Advanced degenerative change of the left hip is seen.  IMPRESSION: Superior dislocation of the femoral component of right hip arthroplasty.   Electronically Signed   By: Jeb Levering M.D.   On: 06/20/2015 04:08    Review of Systems  Constitutional: Negative for fever.  Eyes: Negative for pain.  Respiratory: Negative for shortness of breath.   Cardiovascular: Negative for chest pain.  Gastrointestinal: Negative for diarrhea.  Genitourinary: Negative for flank pain.  Neurological: Negative for tingling.  Psychiatric/Behavioral: The patient is not nervous/anxious.     Blood pressure 157/75, pulse 75, temperature 97.7 F (36.5 C), temperature source Oral, resp. rate 17, height $RemoveBe'5\' 5"'[id number]$  (1.651 m), weight 158 lb (71.668 kg), SpO2 100 %. Physical Exam  Nursing note and vitals reviewed. Constitutional: She is oriented to person, place, and time. She appears well-developed and well-nourished.  HENT:  Head: Normocephalic  and atraumatic.  Eyes: Pupils are equal, round, and reactive to light.  Neck: Normal range of motion.  Cardiovascular: Normal rate and regular rhythm.   Respiratory: No respiratory distress.  GI: There is no rebound.  Musculoskeletal:       Right hip: She exhibits decreased range of motion, tenderness and deformity. She exhibits no swelling and no crepitus.       Right upper arm: She exhibits no tenderness.       Left upper arm: Normal.       Left upper leg: Normal.       Legs: Neurological: She is alert and oriented to person, place, and time. No  sensory deficit. She exhibits normal muscle tone.  Skin: Skin is warm and dry. No erythema.  Psychiatric: She has a normal mood and affect. Her behavior is normal. Judgment and thought content normal.     Assessment/Plan 2nd RT THA DISLOCATION COULDN'T REDUCE IN ER  TAKING TO OR FOR GEN ANESTH AND CLOSED REDUCTION RT THA  ADVISED MS [place] I WILL NOT OPEN THE HIP IF I CANT REDUCE   ADD: I GAVE HER 10 MEQ K FOR K OF 3.4  Arther Abbott 06/20/2015, 7:26 AM

## 2015-06-21 ENCOUNTER — Ambulatory Visit (HOSPITAL_COMMUNITY): Payer: Medicare Other | Admitting: Physical Therapy

## 2015-06-21 ENCOUNTER — Encounter (HOSPITAL_COMMUNITY): Payer: Self-pay | Admitting: Orthopedic Surgery

## 2015-06-21 DIAGNOSIS — T84020A Dislocation of internal right hip prosthesis, initial encounter: Secondary | ICD-10-CM | POA: Diagnosis not present

## 2015-06-21 MED ORDER — TRAMADOL HCL 50 MG PO TABS: 50.0000 mg | ORAL_TABLET | Freq: Four times a day (QID) | ORAL | Status: DC | PRN

## 2015-06-21 NOTE — Clinical Social Work Note (Signed)
Pt referred for possible SNF. Pt d/c today and has been evaluated by PT. Will return home with home health. CSW will sign off.  Derenda Fennel, LCSW [phone]

## 2015-06-21 NOTE — Addendum Note (Signed)
Addendum  created 06/21/15 0909 by Earleen Newport, CRNA   Modules edited: Notes Section   Notes Section:  File: [id number]

## 2015-06-21 NOTE — Progress Notes (Signed)
1215 d/c instructions, paperwork and hard Rxs given to patient. IV catheter removed from RIGHT AC, catheter intact, no s/s of infection noted, patient tolerated well w/no c/o pain or discomfort noted. Patient confirmed that she is waiting on her sister to arrive to give her a ride home around 1pm.

## 2015-06-21 NOTE — Discharge Summary (Signed)
Physician Discharge Summary  Patient ID: [name] MRN: [id number] DOB/AGE: 1933-08-28 81 y.o.  Admit date: 06/20/2015 Discharge date: 06/21/2015  Admission Diagnoses: Dislocation of right total hip prosthesis  Discharge Diagnoses: Same Active Problems:   S/P closed reduction of dislocated total hip prothesis   Discharged Condition: stable  Hospital Course:  September 18 the patient was admitted after being seen in the emergency room with a dislocated total hip on the right side. She had attempted closed reduction under propofol sedation which was unsuccessful. ED consultation was obtained and because of the patient having 2 episodes of apnea during the conscious sedation we took her to surgery for controlled airway and close reduction. Closed reduction was successful. AP and lateral x-rays confirm the reduction. The exam under anesthesia after reduction was as follows: I was unable to dislocate the hip in any position. We did note right leg longer than left.  September 19 the patient had physical therapy. She was stable for hip was feeling much better. She had no evidence of dislocation. She was neurovascularly intact. She was discharged home  Discharge Exam: Blood pressure 136/56, pulse 72, temperature 98.6 F (37 C), temperature source Oral, resp. rate 16, height  (1.651 m), weight 158 lb (71.668 kg), SpO2 100 %.   Disposition: 01-Home or Self Care  Discharge Instructions    Call MD / Call 911    Complete by:  As directed   If you experience chest pain or shortness of breath, CALL 911 and be transported to the hospital emergency room.  If you develope a fever above 101 F, pus (white drainage) or increased drainage or redness at the wound, or calf pain, call your surgeon's office.     Constipation Prevention    Complete by:  As directed   Drink plenty of fluids.  Prune juice may be helpful.  You may use a stool softener, such as Colace (over the counter) 100 mg twice a  day.  Use MiraLax (over the counter) for constipation as needed.     Diet - low sodium heart healthy    Complete by:  As directed      Discharge instructions    Complete by:  As directed   Posterior approach hip precautions   Call Dr Darrelyn Hillock today to make appt     Driving restrictions    Complete by:  As directed   No driving for 2 weeks     Follow the hip precautions as taught in Physical Therapy    Complete by:  As directed      Increase activity slowly as tolerated    Complete by:  As directed             Medication List    STOP taking these medications        rivaroxaban 10 MG Tabs tablet  Commonly known as:  XARELTO      TAKE these medications        HYDROcodone-acetaminophen 5-325 MG per tablet  Commonly known as:  NORCO/VICODIN  Take 1-2 tablets by mouth every 4 (four) hours as needed for severe pain (breakthrough pain).     levothyroxine 112 MCG tablet  Commonly known as:  SYNTHROID, LEVOTHROID  Take 112 mcg by mouth daily before breakfast.     lisinopril-hydrochlorothiazide 20-25 MG per tablet  Commonly known as:  PRINZIDE,ZESTORETIC  Take 1 tablet by mouth daily.     methocarbamol 500 MG tablet  Commonly known as:  ROBAXIN  Take  1 tablet (500 mg total) by mouth every 6 (six) hours as needed for muscle spasms.     potassium chloride SA 20 MEQ tablet  Commonly known as:  K-DUR,KLOR-CON  Take 1 tablet (20 mEq total) by mouth 2 (two) times daily.     traMADol 50 MG tablet  Commonly known as:  ULTRAM  Take 1-2 tablets (50-100 mg total) by mouth every 6 (six) hours as needed for moderate pain.     traZODone 50 MG tablet  Commonly known as:  DESYREL  Take 100 mg by mouth at bedtime.           Follow-up Information    Follow up with GIOFFRE,RONALD A, MD. Call today.   Specialty:  Orthopedic Surgery   Contact information:   [address] [place] [address] [phone]       Signed: Fuller Canada 06/21/2015, 8:07  AM

## 2015-06-21 NOTE — Anesthesia Postprocedure Evaluation (Signed)
  Anesthesia Post-op Note Late entry  Patient: [name]  Procedure(s) Performed: Procedure(s): CLOSED REDUCTION RIGHT HIP (Right)  Patient Location: Room 327  Anesthesia Type:General  Level of Consciousness: awake, alert , oriented and patient cooperative  Airway and Oxygen Therapy: Patient Spontanous Breathing  Post-op Pain: none  Post-op Assessment: Post-op Vital signs reviewed, Patient's Cardiovascular Status Stable, Respiratory Function Stable, Patent Airway, No signs of Nausea or vomiting and Pain level controlled LLE Motor Response: Purposeful movement, Responds to commands LLE Sensation: Full sensation, No pain, No tingling RLE Motor Response: Purposeful movement RLE Sensation: Full sensation      Post-op Vital Signs: Reviewed and stable  Last Vitals:  Filed Vitals:   06/21/15 0631  BP: 136/56  Pulse: 72  Temp: 37 C  Resp: 16    Complications: No apparent anesthesia complications

## 2015-06-21 NOTE — Care Management Note (Signed)
Case Management Note  Patient Details  Name: [name] MRN: [id number] Date of Birth: 1933/04/04  Subjective/Objective:                  PT admitted from home s/p closed reduction of dislocated total hip prothesis. Pt lives alone and will return home at discharge. Pt has a sister who is currently staying with pt to assist pt as needed. Pt has a 3N1 and rolling walker for home use.  Action/Plan: PT recommends HH PT at discharge with BSC and tub transfer bench. Pt would like [facility] for PT (per pts choice). Alroy Bailiff of [facility] is aware of referral and will collect the pts information from the chart. HH services to start within 48 hours of discharge. Pt given scripts for [facility] and tub transfer bench and pts sister will obtain DME from [facility] post discharge. No other CM needs noted. Pt and pts nurse aware of discharge arrangements.  Expected Discharge Date:  06/21/15               Expected Discharge Plan:  Home w Home Health Services  In-House Referral:  NA  Discharge planning Services  CM Consult  Post Acute Care Choice:  Durable Medical Equipment, Home Health Choice offered to:  Patient  DME Arranged:  Bedside commode, Tub bench DME Agency:  [facility]  HH Arranged:  PT HH Agency:  [facility]  Status of Service:  Completed, signed off  Medicare Important Message Given:    Date Medicare IM Given:    Medicare IM give by:    Date Additional Medicare IM Given:    Additional Medicare Important Message give by:     If discussed at Long Length of Stay Meetings, dates discussed:    Additional Comments:  Cheryl Flash, RN 06/21/2015, 10:26 AM

## 2015-06-21 NOTE — Evaluation (Signed)
Physical Therapy Evaluation Patient Details Name: [name] MRN: [id number] DOB: 13-Dec-1932 Today's Date: 06/21/2015   History of Present Illness  September 18 the patient was admitted after being seen in the emergency room with a dislocated total hip on the right side. She had attempted closed reduction under propofol sedation which was unsuccessful. ED consultation was obtained and because of the patient having 2 episodes of apnea during the conscious sedation we took her to surgery for controlled airway and close reduction. Closed reduction was successful. AP and lateral x-rays confirm the reduction. The exam under anesthesia after reduction was as follows: I was unable to dislocate the hip in any position. We did note right leg longer than left.  Clinical Impression   Pt is a very pleasant pt who was discharged from the [facility] about 2 weeks ago following R THR, posterior approach.  Her sister is currently staying with her as pt normally lives alone.  Unfortunately pt had her 1st dislocation on 05-20-15 when transferring back to bed.  She has been using a knee immobilizer at night since then.  She dislocated the hip the 2nd time when attempting to sit on the commode in the middle of the night and missed it, sitting on the floor instead.  Currently, pt has no hip pain with an obvious leg length discrepancy R>L.  She was reinstructed in posterior hip precautions and then practiced adhering to these with transfers in and out of bed, chair.  We discussed how to maintain hip precautions with car transfers.  She currently needs a tub transfer bench for safety with showers.  I am also recommending that she have a BSC at bedside for nighttime use.  In addition, she has been using a low sitting recliner at home which is very unsafe for her transfers.  She will look into getting a safer chair to replace it.  I would like to have a PT evaluate her in her home setting to insure proper protection of her hip  within her environment.  In my opinion she is not yet ready to begin OP PT as had initially been set up at [facility].    Follow Up Recommendations Home health PT    Equipment Recommendations  3in1 (PT) (tub transfer bench)    Recommendations for Other Services   may benefit from [facility]    Precautions / Restrictions Precautions Precautions: Posterior Hip Required Braces or Orthoses: Knee Immobilizer - Right Knee Immobilizer - Right: Other (comment) (to be worn when sleeping in bed) Restrictions Weight Bearing Restrictions: No RUE Weight Bearing: Weight bearing as tolerated      Mobility  Bed Mobility Overal bed mobility: Modified Independent             General bed mobility comments: pt instructed in how to adhere to hip precautions when getting into and out of bed  Transfers Overall transfer level: Modified independent Equipment used: Rolling walker (2 wheeled)             General transfer comment: instructed in adhering to hip precautions when transferring to chair, car  Ambulation/Gait Ambulation/Gait assistance: Modified independent (Device/Increase time) Ambulation Distance (Feet): 100 Feet Assistive device: Rolling walker (2 wheeled) Gait Pattern/deviations: Decreased stance time - right   Gait velocity interpretation: >2.62 ft/sec, indicative of independent community ambulator General Gait Details: pt has a significant leg length discrepancy, right longer than left...this forces an abnormal giat pattern  Stairs  Wheelchair Mobility    Modified Rankin (Stroke Patients Only)       Balance Overall balance assessment: No apparent balance deficits (not formally assessed)                                           Pertinent Vitals/Pain Pain Assessment: No/denies pain    Home Living Family/patient expects to be discharged to:: Private residence Living Arrangements: Alone Available Help at Discharge: Family;Available 24  hours/day Type of Home: House Home Access: Stairs to enter Entrance Stairs-Rails: Right Entrance Stairs-Number of Steps: 2 Home Layout: One level Home Equipment: Walker - 2 wheels;Shower seat;Hand held shower head;Adaptive equipment      Prior Function Level of Independence: Independent with assistive device(s)               Hand Dominance        Extremity/Trunk Assessment               Lower Extremity Assessment: RLE deficits/detail RLE Deficits / Details: generalized weakness following THR       Communication   Communication: No difficulties  Cognition Arousal/Alertness: Awake/alert Behavior During Therapy: WFL for tasks assessed/performed Overall Cognitive Status: Within Functional Limits for tasks assessed                      General Comments      Exercises        Assessment/Plan    PT Assessment All further PT needs can be met in the next venue of care  PT Diagnosis  (inadequate understanding of THR precautions)   PT Problem List Decreased knowledge of precautions  PT Treatment Interventions     PT Goals (Current goals can be found in the Care Plan section) Acute Rehab PT Goals PT Goal Formulation: All assessment and education complete, DC therapy    Frequency     Barriers to discharge        Co-evaluation               End of Session Equipment Utilized During Treatment: Gait belt Activity Tolerance: Patient tolerated treatment well;No increased pain Patient left: in bed;with call bell/phone within reach Nurse Communication: Mobility status    Functional Assessment Tool Used: clinical judgement Functional Limitation: Mobility: Walking and moving around Mobility: Walking and Moving Around Current Status (E7209): At least 1 percent but less than 20 percent impaired, limited or restricted Mobility: Walking and Moving Around Goal Status [phone]): At least 1 percent but less than 20 percent impaired, limited or  restricted Mobility: Walking and Moving Around Discharge Status [phone]): At least 1 percent but less than 20 percent impaired, limited or restricted    Time: 0910-0955 PT Time Calculation (min) (ACUTE ONLY): 45 min   Charges:   PT Evaluation $Initial PT Evaluation Tier I: 1 Procedure PT Treatments $Self Care/Home Management: 8-22   PT G Codes:   PT G-Codes **NOT FOR INPATIENT CLASS** Functional Assessment Tool Used: clinical judgement Functional Limitation: Mobility: Walking and moving around Mobility: Walking and Moving Around Current Status (Q9476): At least 1 percent but less than 20 percent impaired, limited or restricted Mobility: Walking and Moving Around Goal Status [phone]): At least 1 percent but less than 20 percent impaired, limited or restricted Mobility: Walking and Moving Around Discharge Status [phone]): At least 1 percent but less than 20 percent impaired, limited or restricted  Demetrios Isaacs L  PT 06/21/2015, 10:06 AM [phone]

## 2015-06-21 NOTE — Discharge Instructions (Signed)
Follow-up posterior total hip precautions do not sit in low chairs.  Weightbearing as tolerated with a walker

## 2015-06-22 DIAGNOSIS — Z96641 Presence of right artificial hip joint: Secondary | ICD-10-CM | POA: Diagnosis not present

## 2015-06-22 DIAGNOSIS — Z4789 Encounter for other orthopedic aftercare: Secondary | ICD-10-CM | POA: Diagnosis not present

## 2015-06-22 DIAGNOSIS — R531 Weakness: Secondary | ICD-10-CM | POA: Diagnosis not present

## 2015-06-22 DIAGNOSIS — I10 Essential (primary) hypertension: Secondary | ICD-10-CM | POA: Diagnosis not present

## 2015-06-22 DIAGNOSIS — R269 Unspecified abnormalities of gait and mobility: Secondary | ICD-10-CM | POA: Diagnosis not present

## 2015-06-23 DIAGNOSIS — Z4789 Encounter for other orthopedic aftercare: Secondary | ICD-10-CM | POA: Diagnosis not present

## 2015-06-23 DIAGNOSIS — R531 Weakness: Secondary | ICD-10-CM | POA: Diagnosis not present

## 2015-06-23 DIAGNOSIS — I10 Essential (primary) hypertension: Secondary | ICD-10-CM | POA: Diagnosis not present

## 2015-06-23 DIAGNOSIS — Z96641 Presence of right artificial hip joint: Secondary | ICD-10-CM | POA: Diagnosis not present

## 2015-06-23 DIAGNOSIS — R269 Unspecified abnormalities of gait and mobility: Secondary | ICD-10-CM | POA: Diagnosis not present

## 2015-06-29 DIAGNOSIS — Z96641 Presence of right artificial hip joint: Secondary | ICD-10-CM | POA: Diagnosis not present

## 2015-06-29 DIAGNOSIS — Z4789 Encounter for other orthopedic aftercare: Secondary | ICD-10-CM | POA: Diagnosis not present

## 2015-06-29 DIAGNOSIS — I10 Essential (primary) hypertension: Secondary | ICD-10-CM | POA: Diagnosis not present

## 2015-06-29 DIAGNOSIS — R531 Weakness: Secondary | ICD-10-CM | POA: Diagnosis not present

## 2015-06-29 DIAGNOSIS — R269 Unspecified abnormalities of gait and mobility: Secondary | ICD-10-CM | POA: Diagnosis not present

## 2015-06-30 DIAGNOSIS — Z96641 Presence of right artificial hip joint: Secondary | ICD-10-CM | POA: Diagnosis not present

## 2015-06-30 DIAGNOSIS — I10 Essential (primary) hypertension: Secondary | ICD-10-CM | POA: Diagnosis not present

## 2015-06-30 DIAGNOSIS — Z4789 Encounter for other orthopedic aftercare: Secondary | ICD-10-CM | POA: Diagnosis not present

## 2015-06-30 DIAGNOSIS — R269 Unspecified abnormalities of gait and mobility: Secondary | ICD-10-CM | POA: Diagnosis not present

## 2015-06-30 DIAGNOSIS — R531 Weakness: Secondary | ICD-10-CM | POA: Diagnosis not present

## 2015-07-05 DIAGNOSIS — R531 Weakness: Secondary | ICD-10-CM | POA: Diagnosis not present

## 2015-07-05 DIAGNOSIS — I10 Essential (primary) hypertension: Secondary | ICD-10-CM | POA: Diagnosis not present

## 2015-07-05 DIAGNOSIS — R269 Unspecified abnormalities of gait and mobility: Secondary | ICD-10-CM | POA: Diagnosis not present

## 2015-07-05 DIAGNOSIS — Z96641 Presence of right artificial hip joint: Secondary | ICD-10-CM | POA: Diagnosis not present

## 2015-07-05 DIAGNOSIS — Z4789 Encounter for other orthopedic aftercare: Secondary | ICD-10-CM | POA: Diagnosis not present

## 2015-07-06 DIAGNOSIS — Z471 Aftercare following joint replacement surgery: Secondary | ICD-10-CM | POA: Diagnosis not present

## 2015-07-06 DIAGNOSIS — T84020D Dislocation of internal right hip prosthesis, subsequent encounter: Secondary | ICD-10-CM | POA: Diagnosis not present

## 2015-07-07 DIAGNOSIS — Z96641 Presence of right artificial hip joint: Secondary | ICD-10-CM | POA: Diagnosis not present

## 2015-07-07 DIAGNOSIS — Z4789 Encounter for other orthopedic aftercare: Secondary | ICD-10-CM | POA: Diagnosis not present

## 2015-07-07 DIAGNOSIS — R531 Weakness: Secondary | ICD-10-CM | POA: Diagnosis not present

## 2015-07-07 DIAGNOSIS — I10 Essential (primary) hypertension: Secondary | ICD-10-CM | POA: Diagnosis not present

## 2015-07-07 DIAGNOSIS — R269 Unspecified abnormalities of gait and mobility: Secondary | ICD-10-CM | POA: Diagnosis not present

## 2015-07-20 ENCOUNTER — Other Ambulatory Visit: Payer: Self-pay

## 2015-07-20 DIAGNOSIS — Z969 Presence of functional implant, unspecified: Secondary | ICD-10-CM | POA: Diagnosis not present

## 2015-07-20 DIAGNOSIS — Z23 Encounter for immunization: Secondary | ICD-10-CM | POA: Diagnosis not present

## 2015-07-20 DIAGNOSIS — I10 Essential (primary) hypertension: Secondary | ICD-10-CM | POA: Diagnosis not present

## 2015-07-20 DIAGNOSIS — Z6824 Body mass index (BMI) 24.0-24.9, adult: Secondary | ICD-10-CM | POA: Diagnosis not present

## 2015-08-10 ENCOUNTER — Encounter (HOSPITAL_COMMUNITY): Payer: Medicare Other

## 2015-08-31 DIAGNOSIS — Z23 Encounter for immunization: Secondary | ICD-10-CM | POA: Diagnosis not present

## 2015-09-14 DIAGNOSIS — Z471 Aftercare following joint replacement surgery: Secondary | ICD-10-CM | POA: Diagnosis not present

## 2015-09-14 DIAGNOSIS — Z96641 Presence of right artificial hip joint: Secondary | ICD-10-CM | POA: Diagnosis not present

## 2015-09-14 DIAGNOSIS — T84020D Dislocation of internal right hip prosthesis, subsequent encounter: Secondary | ICD-10-CM | POA: Diagnosis not present

## 2015-10-21 ENCOUNTER — Ambulatory Visit (INDEPENDENT_AMBULATORY_CARE_PROVIDER_SITE_OTHER): Payer: Medicare Other | Admitting: Otolaryngology

## 2015-10-21 DIAGNOSIS — H6123 Impacted cerumen, bilateral: Secondary | ICD-10-CM | POA: Diagnosis not present

## 2015-10-21 DIAGNOSIS — H903 Sensorineural hearing loss, bilateral: Secondary | ICD-10-CM

## 2015-10-22 DIAGNOSIS — M1990 Unspecified osteoarthritis, unspecified site: Secondary | ICD-10-CM | POA: Diagnosis not present

## 2015-10-22 DIAGNOSIS — I10 Essential (primary) hypertension: Secondary | ICD-10-CM | POA: Diagnosis not present

## 2015-11-11 DIAGNOSIS — H52223 Regular astigmatism, bilateral: Secondary | ICD-10-CM | POA: Diagnosis not present

## 2015-11-11 DIAGNOSIS — H5203 Hypermetropia, bilateral: Secondary | ICD-10-CM | POA: Diagnosis not present

## 2015-11-11 DIAGNOSIS — H35373 Puckering of macula, bilateral: Secondary | ICD-10-CM | POA: Diagnosis not present

## 2015-11-11 DIAGNOSIS — H524 Presbyopia: Secondary | ICD-10-CM | POA: Diagnosis not present

## 2016-01-26 DIAGNOSIS — R921 Mammographic calcification found on diagnostic imaging of breast: Secondary | ICD-10-CM | POA: Diagnosis not present

## 2016-01-31 ENCOUNTER — Other Ambulatory Visit (HOSPITAL_COMMUNITY): Payer: Self-pay | Admitting: Internal Medicine

## 2016-01-31 DIAGNOSIS — R928 Other abnormal and inconclusive findings on diagnostic imaging of breast: Secondary | ICD-10-CM

## 2016-02-01 ENCOUNTER — Other Ambulatory Visit (HOSPITAL_COMMUNITY): Payer: Self-pay | Admitting: Internal Medicine

## 2016-02-01 DIAGNOSIS — R928 Other abnormal and inconclusive findings on diagnostic imaging of breast: Secondary | ICD-10-CM

## 2016-02-04 ENCOUNTER — Telehealth: Payer: Self-pay | Admitting: Orthopaedic Surgery

## 2016-02-04 NOTE — Telephone Encounter (Signed)
Patient called this morning wanting to know if we had received [facility].  She said that they were to have mailed those records to us.  I looked through all our referrals and could not find her notes.  I told her that I could not find them and she stated that [facility] them.  I told her that I would call them and see if they had indeed mailed her records to us and to ensure they were mailed to the correct address.    [facility] and spoke to MerauxDella in the medical records department.  She stated that records are sent out from a 3rd party medical records company but that she would check on this for me.  Idell PicklesDella states that Ms. Carolin SicksFrizzell was notified by email that she needed to call regarding payment for them sending the notes to our office.  I asked Idell PicklesDella for the phone number for the medical records company and that I would have Ms. Carolin SicksFrizzell call and speak to them.  I called Ms. Carolin SicksFrizzell and gave her the phone number [phone].  I told her that this company handles the release of her records and that she could call them to see what was going on.   I did not speak to Ms. Carolin SicksFrizzell regarding any kind of payment or anything else regarding this.  I

## 2016-02-08 ENCOUNTER — Ambulatory Visit (HOSPITAL_COMMUNITY)
Admission: RE | Admit: 2016-02-08 | Discharge: 2016-02-08 | Disposition: A | Discharge status: Home / Self Care | Payer: Medicare Other | Source: Ambulatory Visit | Attending: Internal Medicine | Admitting: Internal Medicine

## 2016-02-08 ENCOUNTER — Other Ambulatory Visit (HOSPITAL_COMMUNITY): Payer: Self-pay

## 2016-02-08 DIAGNOSIS — R928 Other abnormal and inconclusive findings on diagnostic imaging of breast: Secondary | ICD-10-CM

## 2016-02-08 DIAGNOSIS — R921 Mammographic calcification found on diagnostic imaging of breast: Secondary | ICD-10-CM | POA: Diagnosis not present

## 2016-02-10 ENCOUNTER — Encounter: Payer: Self-pay | Admitting: Orthopedic Surgery

## 2016-02-14 ENCOUNTER — Encounter: Payer: Self-pay | Admitting: Orthopedic Surgery

## 2016-02-14 ENCOUNTER — Ambulatory Visit (INDEPENDENT_AMBULATORY_CARE_PROVIDER_SITE_OTHER): Payer: Medicare Other

## 2016-02-14 ENCOUNTER — Ambulatory Visit (INDEPENDENT_AMBULATORY_CARE_PROVIDER_SITE_OTHER): Payer: Medicare Other | Admitting: Orthopedic Surgery

## 2016-02-14 VITALS — BP 138/71 | HR 75 | Ht 65.0 in | Wt 155.0 lb

## 2016-02-14 DIAGNOSIS — M25512 Pain in left shoulder: Secondary | ICD-10-CM | POA: Diagnosis not present

## 2016-02-14 DIAGNOSIS — M129 Arthropathy, unspecified: Secondary | ICD-10-CM | POA: Diagnosis not present

## 2016-02-14 DIAGNOSIS — M19012 Primary osteoarthritis, left shoulder: Secondary | ICD-10-CM

## 2016-02-14 NOTE — Progress Notes (Signed)
Chief Complaint  Patient presents with  . Shoulder Pain    left shoulder pain    HPI 4 year history of left shoulder pain previously evaluated at [facility] orthopedics wants second opinion regarding left shoulder replacement  Patient has pain in the left shoulder restricted range of motion problems with her daily hygiene and problems with activities of daily living cleaning and lifting  5 out of 10 pain  Received one injection 4 years ago and it helped significantly  ROS  Back pain joint pain anxiety denies numbness or tingling fever chills night sweats chest pain or shortness of breath  Past Medical History  Diagnosis Date  . Hypertension   . Arthritis   . Thyroid disease     Past Surgical History  Procedure Laterality Date  . Tonsillectomy    . Tubal ligation    . Breast surgery  1993    L BREAST BX - BENIGN  . Total hip arthroplasty Right 05/12/2015    Procedure: RIGHT TOTAL HIP ARTHROPLASTY;  Surgeon: Ranee Gosselin, MD;  Location: [facility];  Service: Orthopedics;  Laterality: Right;  . Hip closed reduction Right 06/20/2015    Procedure: CLOSED REDUCTION RIGHT HIP;  Surgeon: Vickki Hearing, MD;  Location: [facility];  Service: Orthopedics;  Laterality: Right;   No family history on file. Social History  Substance Use Topics  . Smoking status: Never Smoker   . Smokeless tobacco: None  . Alcohol Use: Yes     Comment: RARE    Current outpatient prescriptions:  .  levothyroxine (SYNTHROID, LEVOTHROID) 112 MCG tablet, Take 112 mcg by mouth daily before breakfast., Disp: , Rfl:  .  lisinopril-hydrochlorothiazide (PRINZIDE,ZESTORETIC) 20-25 MG per tablet, Take 1 tablet by mouth daily. , Disp: , Rfl:  .  traZODone (DESYREL) 50 MG tablet, Take 100 mg by mouth at bedtime., Disp: , Rfl:  No current facility-administered medications for this visit.  Facility-Administered Medications Ordered in Other Visits:  .  bupivacaine liposome (EXPAREL) 1.3 % injection 266 mg, 20 mL,  Infiltration, Once, Amber Constable, PA-C  BP 138/71 mmHg  Pulse 75  Ht  (1.651 m)  Wt 155 lb (70.308 kg)  BMI 25.79 kg/m2  Physical Exam  Constitutional: She is oriented to person, place, and time. She appears well-developed and well-nourished. No distress.  Cardiovascular: Normal rate and intact distal pulses.   Neurological: She is alert and oriented to person, place, and time. She has normal reflexes. She exhibits normal muscle tone. Coordination normal.  Skin: Skin is warm and dry. No rash noted. She is not diaphoretic. No erythema. No pallor.  Psychiatric: She has a normal mood and affect. Her behavior is normal. Judgment and thought content normal.    Ortho Exam Right shoulder flexion 150 no tenderness no crepitance stable in abduction external rotation rotator cuff cuff strength 5 over 5 manual muscle test skin normal pulse and perfusion right arm normal lymph nodes negative sensation normal  Left shoulder active flexion 70. External rotation 10. Tenderness crepitance anterior joint line stable and inferior subluxation test cuff strength weakness skin normal pulses intact lymph nodes negative sensation normal    ASSESSMENT: My personal interpretation of the images:  First set of films include 3 views of the left shoulder with axillary view large inferior osteophyte slight proximal migration of humerus joint space narrowing posterior glenoid no erosion  Today's film    PLAN She says that she was told she needs a left total hip and would like to  have that as her only surgery she is okay with injection  Procedure note the subacromial injection shoulder left   Verbal consent was obtained to inject the  Left   Shoulder  Timeout was completed to confirm the injection site is a subacromial space of the  left  shoulder  Medication used Depo-Medrol 40 mg and lidocaine 1% 3 cc  Anesthesia was provided by ethyl chloride  The injection was performed in the left   posterior subacromial space. After pinning the skin with alcohol and anesthetized the skin with ethyl chloride the subacromial space was injected using a 20-gauge needle. There were no complications  Sterile dressing was applied.

## 2016-02-14 NOTE — Patient Instructions (Signed)

## 2016-03-16 DIAGNOSIS — E785 Hyperlipidemia, unspecified: Secondary | ICD-10-CM | POA: Diagnosis not present

## 2016-03-16 DIAGNOSIS — E039 Hypothyroidism, unspecified: Secondary | ICD-10-CM | POA: Diagnosis not present

## 2016-03-16 DIAGNOSIS — Z79899 Other long term (current) drug therapy: Secondary | ICD-10-CM | POA: Diagnosis not present

## 2016-03-16 DIAGNOSIS — I10 Essential (primary) hypertension: Secondary | ICD-10-CM | POA: Diagnosis not present

## 2016-03-23 DIAGNOSIS — N183 Chronic kidney disease, stage 3 (moderate): Secondary | ICD-10-CM | POA: Diagnosis not present

## 2016-03-23 DIAGNOSIS — I10 Essential (primary) hypertension: Secondary | ICD-10-CM | POA: Diagnosis not present

## 2016-03-23 DIAGNOSIS — E030 Congenital hypothyroidism with diffuse goiter: Secondary | ICD-10-CM | POA: Diagnosis not present

## 2016-03-23 DIAGNOSIS — Z0001 Encounter for general adult medical examination with abnormal findings: Secondary | ICD-10-CM | POA: Diagnosis not present

## 2016-03-30 ENCOUNTER — Ambulatory Visit (INDEPENDENT_AMBULATORY_CARE_PROVIDER_SITE_OTHER): Payer: Medicare Other | Admitting: Orthopedic Surgery

## 2016-03-30 ENCOUNTER — Encounter: Payer: Self-pay | Admitting: Orthopedic Surgery

## 2016-03-30 ENCOUNTER — Ambulatory Visit (INDEPENDENT_AMBULATORY_CARE_PROVIDER_SITE_OTHER): Payer: Medicare Other

## 2016-03-30 VITALS — BP 142/87 | Ht 66.0 in | Wt 160.0 lb

## 2016-03-30 DIAGNOSIS — M545 Low back pain, unspecified: Secondary | ICD-10-CM

## 2016-03-30 DIAGNOSIS — M25552 Pain in left hip: Secondary | ICD-10-CM

## 2016-03-30 NOTE — Patient Instructions (Signed)
CALL [facility] THERAPY DEPT TO SCHEDULE THERAPY VISITS [phone] 

## 2016-04-05 NOTE — Progress Notes (Signed)
Patient ID: [name], female   DOB: 1933-03-26, 82 y.o.   MRN: [id number]  Chief Complaint  Patient presents with  . Hip Pain    LEFT HIP PAIN    HPI 82 year old female history of right hip replacement in the past have seen her for her hip as well she has arthritis in the left hip comes in with left hip pain  Pain is located of the left box and left iliac crest it radiates into the left leg and is not associated with groin pain weakness or bowel or bladder dysfunction she's had no trauma seems to be worse when she's sitting for long periods of time or standing for long periods of time  Review of Systems  Constitutional: Negative for fever and chills.  Gastrointestinal: Negative for abdominal pain.  Genitourinary: Negative for dysuria and urgency.  Musculoskeletal: Positive for back pain.  Neurological: Negative for tingling and sensory change.    Past Medical History  Diagnosis Date  . Hypertension   . Arthritis   . Thyroid disease     BP 142/87 mmHg  Ht 5\' 6"  (1.676 m)  Wt 160 lb (72.576 kg)  BMI 25.84 kg/m2  Physical Exam Physical Exam  Constitutional: The patient appears well-developed and well-nourished. No distress.  The patient is oriented to person, place, and time.  Psychiatric: The patient has a normal mood and affect.  Cardiovascular: Intact distal pulses.   Neurological: sensation is normal  Skin: Skin is warm and dry. No rash noted. The patient is not diaphoretic. No erythema. No pallor.    Ortho Exam  Right hip was replaced leg alignment is normal range of motion in the hip is 120 of flexion 15 of abduction. Hip is stable muscle strength and tone is normal in the right leg  Left leg alignment is normal hip flexion is normal pain-free mild discomfort with internal rotation of 15 hip is stable muscle strength and tone normal left leg  Lumbar spine is tender over the centrals L4 and L5 region left SI joint left buttock. Skin normal both  legs    ASSESSMENT AND PLAN our films today show osteoarthritis left hip but her pain is coming from her lower back so we will address that  No hip replacement needed at this time   Fuller CanadaStanley Marcee Jacobs, MD 04/05/2016 5:38 PM

## 2016-04-12 ENCOUNTER — Ambulatory Visit (HOSPITAL_COMMUNITY): Payer: Medicare Other | Attending: Orthopedic Surgery

## 2016-04-12 DIAGNOSIS — R2689 Other abnormalities of gait and mobility: Secondary | ICD-10-CM

## 2016-04-12 DIAGNOSIS — R262 Difficulty in walking, not elsewhere classified: Secondary | ICD-10-CM

## 2016-04-12 DIAGNOSIS — M25652 Stiffness of left hip, not elsewhere classified: Secondary | ICD-10-CM

## 2016-04-12 NOTE — Therapy (Signed)
[facility] [address] [place], [place], [address] Phone: [phone]   Fax:  [phone]  Physical Therapy Evaluation  Patient Details  Name: [name] MRN: [id number] Date of Birth: 1933-06-11 Referring Provider: Fuller Canada   Encounter Date: 04/12/2016      PT End of Session - 04/12/16 1506    Visit Number 1   Number of Visits 8   Date for PT Re-Evaluation 05/13/16   Authorization Type U[employer] Medicare   Authorization Time Period 04/12/16-06/13/16   Authorization - Visit Number 1   Authorization - Number of Visits 8   PT Start Time 1348   PT Stop Time 1445   PT Time Calculation (min) 57 min   Activity Tolerance Patient limited by pain;Patient limited by fatigue      Past Medical History  Diagnosis Date  . Hypertension   . Arthritis   . Thyroid disease     Past Surgical History  Procedure Laterality Date  . Tonsillectomy    . Tubal ligation    . Breast surgery  1993    L BREAST BX - BENIGN  . Total hip arthroplasty Right 05/12/2015    Procedure: RIGHT TOTAL HIP ARTHROPLASTY;  Surgeon: Ranee Gosselin, MD;  Location: [facility];  Service: Orthopedics;  Laterality: Right;  . Hip closed reduction Right 06/20/2015    Procedure: CLOSED REDUCTION RIGHT HIP;  Surgeon: Vickki Hearing, MD;  Location: [facility];  Service: Orthopedics;  Laterality: Right;    There were no vitals filed for this visit.       Subjective Assessment - 04/12/16 1351    Subjective Pt has had some left sided pain, which she thought was related to a chronic degenerative hip joint; her visit to ortho reveals the opinion that the pain is originating from the low back. Pain has been mostly across the top of the pelvis. Palpation of the left posterior gluteals reveals pain in the left posterior gluteals.  Pain is painful upon standing.    Pertinent History R THA in August 2016   How long can you sit comfortably? No limitations    How long can you  stand comfortably? 3-4 minutes    How long can you walk comfortably? About 5 minutes.    Diagnostic tests Xray    Patient Stated Goals Be able to perfrom ADL, IADL, housework without having to stop for pain.    Currently in Pain? No/denies            [facility] PT Assessment - 04/12/16 0001    Assessment   Medical Diagnosis Left sided gluteal pain and central  LBP   Referring Provider Fuller Canada    Onset Date/Surgical Date --  August 2016 since; LBP since 1970's intermittently   Hand Dominance Right   Next MD Visit December 2017   Prior Therapy chriopractic for LBP several years ago    Precautions   Precautions None   Balance Screen   Has the patient fallen in the past 6 months No   Has the patient had a decrease in activity level because of a fear of falling?  Yes   Is the patient reluctant to leave their home because of a fear of falling?  No   Home Environment   Living Environment Private residence   Available Help at Discharge Family   Type of Home House   Home Access Stairs to enter   Entrance Stairs-Number of Steps 6   Entrance Stairs-Rails  Left;Right   Prior Function   Level of Independence Independent;Independent with community mobility without device   Sensation   Additional Comments some intermittent Left groin shooting pain, infrequent   ROM / Strength   AROM / PROM / Strength PROM   PROM   PROM Assessment Site Hip   Right/Left Hip Left   Left Hip Extension -6  unable to get to zero degrees (neural), remains partially fl   Left Hip Flexion 104   Left Hip External Rotation  20  supine, hip at 90*   Left Hip Internal Rotation  0  supine, hip at 90*; hard end-feel   Left Hip ABduction 10  hard end-feel; spastic adductors noted.    Left Hip ADduction 6  hard end-feel   Strength   Overall Strength Other (comment)  R SLR: 3+/5; L SLR: 3/5 (painful)   Flexibility   Soft Tissue Assessment /Muscle Length yes   Quadriceps (+) Ely's Test, 75%>on R    Palpation   Patella mobility Left limited and painful; effusion around the patella ligament M>L    Palpation comment Pain surrounding the    Special Tests    Special Tests Hip Special Tests   Hip Special Tests  Luisa HartPatrick (FABER) Test;Ely's Test;Thomas Test;Hip Scouring;Anterior Hip Impingement Test   Luisa HartPatrick [facility](FABER) Test   Findings Positive   Side Left   Maisie Fushomas Test    Findings Positive   Side Right   Comments 7 degrees from neutral   Ely's Test   Findings Positive   Side Left;Right   Comments R 75% > L   Hip Scouring   Findings Positive   Side Left   Comments reproduces familiar back pain   Anterior Hip Impingement Test    Findings Positive   Side  Left   Comments --  FADIR at 70*, 70*; hard end-feel   Transfers   Five time sit to stand comments  17.15   Comments strong valgus moment bilat; hands free   Ambulation/Gait   Ambulation Distance (Feet) 225 Feet   Ambulation Surface Level   Gait velocity 0.6449m/s   Gait Comments RLE abducted, RLE vaulting, minimal rotation and   R knee valgus deformity, L trendelenburg sign   Balance   Balance Assessed Yes   Standardized Balance Assessment   Standardized Balance Assessment Timed Up and Go Test;Five Times Sit to Stand   Five times sit to stand comments  14.58s  strong valgus moments at knees bilat, hands free   Timed Up and Go Test   TUG Normal TUG   Normal TUG (seconds) 17.15           PT Education - 04/12/16 1505    Education provided Yes   Education Details Explained the statistical criteria for the Hip OA Clinical Predication Rule, and the benefit for PT in patients with advanced hip OA.    Person(s) Educated Patient   Methods Explanation   Comprehension Verbalized understanding          PT Short Term Goals - 04/12/16 1658    PT SHORT TERM GOAL #1   Title Pt will demonstrate independence in beginning home exercise program by twos weeks after commencement of therapy, to affirm self-efficacy in work at home to  making progress toward goals.   Status New   PT SHORT TERM GOAL #2   Title After 2 weeks, pt will describe in detail 3 ways to manage exacerbation of symptoms at home to demonstrate greater self-efficacy in self-management of  wellness and function.    Status New   PT SHORT TERM GOAL #3   Title After 4 weeks, pt will demonstrate improvement in Left hip ROM by 10 degree in rotation, flexion, and extension, to improve comfort/mechanics during basic functional mobility.    Status New           Plan - 05-05-16 1509    Clinical Impression Statement Patient demonstrating impaired gait, activity tolerance, Left hip join thypomobility, weakness, and soft tissue impairment all limiting tolerance to standing, walking, and IADL. After indepth investigation of the L hip joint, finding are strongly supportive of left femoroacetabular arthropathy; all hip tests reproduce the patient's familiar pain complains in the back and all palpation surrounding the hip joint  on three sides proves to be very painful and hypertonic, which is not surprising granted the patient's capsular pattern loss of mobility with firm end-feel in three planes. Testing of lumbar spine is limited due to hip joint restrictions, but negative when tested. Sacral thrust is also negative. I am recommending the patient return to ortho in light of these findings and to trial 4-6 visits of OPPT to determine utility of PT in improving function and activity tolerance until additional follow-up can be made.    Rehab Potential Fair   PT Frequency 2x / week   PT Duration 4 weeks   PT Treatment/Interventions ADLs/Self Care Home Management;Moist Heat;Functional mobility training;Therapeutic activities;Therapeutic exercise;Patient/family education;Passive range of motion   PT Next Visit Plan Review treatment goals, work on a stretching program for HEP that does not aggravated pain ans Sx.    PT Home Exercise Plan none today   Recommended Other Services  Ortho FU   Consulted and Agree with Plan of Care Patient      Patient will benefit from skilled therapeutic intervention in order to improve the following deficits and impairments:  Abnormal gait, Cardiopulmonary status limiting activity, Decreased mobility, Decreased strength, Increased edema, Pain, Hypomobility, Impaired flexibility, Postural dysfunction, Improper body mechanics, Increased muscle spasms, Difficulty walking, Decreased range of motion, Obesity  Visit Diagnosis: Stiffness of left hip, not elsewhere classified - Plan: PT plan of care cert/re-cert  Difficulty in walking, not elsewhere classified - Plan: PT plan of care cert/re-cert  Other abnormalities of gait and mobility - Plan: PT plan of care cert/re-cert      G-Codes - 2016-05-05 1701    Functional Assessment Tool Used Clinical Judgment    Functional Limitation Mobility: Walking and moving around   Mobility: Walking and Moving Around Current Status [phone]) At least 60 percent but less than 80 percent impaired, limited or restricted   Mobility: Walking and Moving Around Goal Status [phone]) At least 60 percent but less than 80 percent impaired, limited or restricted       Problem List Patient Active Problem List   Diagnosis Date Noted  . S/P closed reduction of dislocated total hip prothesis   . H/O total hip arthroplasty 05/12/2015    5:06 PM, 05-05-2016 Rosamaria Lints, PT, DPT Physical Therapist at [facility] [phone] (office)     [facility] [address] [place], [place], [address] Phone: [phone]   Fax:  [phone]  Name: RANEISHA BRESS MRN: [id number] Date of Birth: 1933-06-27

## 2016-04-17 ENCOUNTER — Ambulatory Visit (HOSPITAL_COMMUNITY): Payer: Medicare Other | Admitting: Physical Therapy

## 2016-04-17 DIAGNOSIS — R262 Difficulty in walking, not elsewhere classified: Secondary | ICD-10-CM | POA: Diagnosis not present

## 2016-04-17 DIAGNOSIS — R2689 Other abnormalities of gait and mobility: Secondary | ICD-10-CM

## 2016-04-17 DIAGNOSIS — M25652 Stiffness of left hip, not elsewhere classified: Secondary | ICD-10-CM | POA: Diagnosis not present

## 2016-04-17 NOTE — Patient Instructions (Signed)
SINGLE KNEE TO CHEST STRETCH - SKTC  While lying on your back, use your hands and gently draw up a knee towards your chest.   Keep your other knee straight and lying on the ground.   hold 20 sec, repeat 3x on Left leg only      HAMSTRING STRETCH - SUPINE  While lying on your back, raise up your leg and hold the back of your knee until a stretch is felt.  hold 20 sec, repeat 3 times each.     Hip Adductor stretch  Stand by a kitchen counter (you may use your arms to support yourself if needed) with your legs wide apart.  To stretch your right leg, bend the knee of the left leg and allow your body to shift to the left slowly until you feel a mild to moderate stretch on the inside of the right thigh.  Repeat to the left.     BRIDGING  While lying on your back, tighten your lower abdominals, squeeze your buttocks and then raise your buttocks off the floor/bed as creating a "Bridge" with your body. Hold and then lower yourself and repeat. 2x10 reps    Clamshells  Start: Position yourself in fetal position.  Movement: Brace your core and lift your knee off of the other (like a clamshell) Keep your ankles together and focus on maintaining good core stability so your hips do not rock back.  Slowly return to start position and repeat. x10 on each    WALL SQUATS  Leaning up against a wall or closed door on your back, slide your body downward and then return back to upright position.  A door was used here because it was smoother and had less friction than the wall.   Knees should bend in line with the 2nd toe and not pass the front of the foot. 2x10 reps

## 2016-04-17 NOTE — Therapy (Signed)
[facility] [address] [place], [place], [address] Phone: [phone]   Fax:  [phone]  Physical Therapy Treatment  Patient Details  Name: [name] MRN: [id number] Date of Birth: [birth year] Referring Provider: Fuller Canada   Encounter Date: 04/17/2016      PT End of Session - 04/17/16 1515    Visit Number 2   Number of Visits 8   Date for PT Re-Evaluation 05/13/16   Authorization Type UHC Medicare   Authorization Time Period 04/12/16-06/13/16   Authorization - Visit Number 2   Authorization - Number of Visits 8   PT Start Time 1429   PT Stop Time 1508   PT Time Calculation (min) 39 min   Activity Tolerance Patient limited by pain;Patient limited by fatigue   Behavior During Therapy [facility] for tasks assessed/performed      Past Medical History  Diagnosis Date  . Hypertension   . Arthritis   . Thyroid disease     Past Surgical History  Procedure Laterality Date  . Tonsillectomy    . Tubal ligation    . Breast surgery  1993    L BREAST BX - BENIGN  . Total hip arthroplasty Right 05/12/2015    Procedure: RIGHT TOTAL HIP ARTHROPLASTY;  Surgeon: Ranee Gosselin, MD;  Location: [facility];  Service: Orthopedics;  Laterality: Right;  . Hip closed reduction Right 06/20/2015    Procedure: CLOSED REDUCTION RIGHT HIP;  Surgeon: Vickki Hearing, MD;  Location: [facility];  Service: Orthopedics;  Laterality: Right;    There were no vitals filed for this visit.      Subjective Assessment - 04/17/16 1438    Subjective Pt states she has been trying to get on her stationary bike and is trying to pedal ~10 miles a day to improve the strength of her hips. She is not sure what PT will do to help her, but she is willing to take it one visit at a time.    Pertinent History R THA in August 2016   How long can you sit comfortably? No limitations    How long can you stand comfortably? 3-4 minutes    How long can you walk comfortably?  About 5 minutes.    Diagnostic tests Xray    Patient Stated Goals Be able to perfrom ADL, IADL, housework without having to stop for pain.    Currently in Pain? No/denies                         [facility] Adult PT Treatment/Exercise - 04/17/16 0001    Exercises   Exercises Knee/Hip   Knee/Hip Exercises: Stretches   Active Hamstring Stretch 5 reps;10 seconds;Left   Quad Stretch 3 reps;20 seconds;Left   Quad Stretch Limitations PROM   Knee: Self-Stretch to increase Flexion 3 reps;20 seconds   Other Knee/Hip Stretches standing hip adductor stretch 5x10 sec each   Knee/Hip Exercises: Standing   Wall Squat 2 sets;10 reps   Knee/Hip Exercises: Supine   Bridges 1 set;15 reps   Knee/Hip Exercises: Sidelying   Clams x10 reps each, red TB                PT Education - 04/17/16 1514    Education provided Yes   Education Details educated pt on the benefits of PT for pt's with OA; initiated/reviewed HEP   Person(s) Educated Patient   Methods Explanation;Demonstration;Handout   Comprehension Verbalized understanding;Returned demonstration;Need further  instruction          PT Short Term Goals - 04/12/16 1658    PT SHORT TERM GOAL #1   Title Pt will demonstrate independence in beginning home exercise program by twos weeks after commencement of therapy, to affirm self-efficacy in work at home to making progress toward goals.   Status New   PT SHORT TERM GOAL #2   Title After 2 weeks, pt will describe in detail 3 ways to manage exacerbation of symptoms at home to demonstrate greater self-efficacy in self-management of wellness and function.    Status New   PT SHORT TERM GOAL #3   Title After 4 weeks, pt will demonstrate improvement in Left hip ROM by 10 degree in rotation, flexion, and extension, to improve comfort/mechanics during basic functional mobility.    Status New                  Plan - 04/17/16 1516    Clinical Impression Statement Today's  session focused on introduction of therex to improve hip flexibility and strength. Pt with good tolerance to stretches without increased report of pain, however she did report muscle burning/fatigue with strengthening activity. I encouraged pt to adhere to HEP and discussed the benefits of PT for management of osteoarthritis with pt verbalizing understanding. Will continue to educate pt regarding this and update HEP as needed.   Rehab Potential Fair   PT Frequency 2x / week   PT Duration 4 weeks   PT Treatment/Interventions ADLs/Self Care Home Management;Moist Heat;Functional mobility training;Therapeutic activities;Therapeutic exercise;Patient/family education;Passive range of motion   PT Next Visit Plan continue with stretching and LE strengthening that does not aggravated pain and Sx.    PT Home Exercise Plan see pt instructions    Recommended Other Services ortho f/u   Consulted and Agree with Plan of Care Patient      Patient will benefit from skilled therapeutic intervention in order to improve the following deficits and impairments:  Abnormal gait, Cardiopulmonary status limiting activity, Decreased mobility, Decreased strength, Increased edema, Pain, Hypomobility, Impaired flexibility, Postural dysfunction, Improper body mechanics, Increased muscle spasms, Difficulty walking, Decreased range of motion, Obesity  Visit Diagnosis: Stiffness of left hip, not elsewhere classified  Difficulty in walking, not elsewhere classified  Other abnormalities of gait and mobility     Problem List Patient Active Problem List   Diagnosis Date Noted  . S/P closed reduction of dislocated total hip prothesis   . H/O total hip arthroplasty 05/12/2015    3:26 PM,04/17/2016 Marylyn IshiharaSara Kiser PT, DPT [name] [phone]  [facility] [address] [place], [place], [address] Phone: [phone]   Fax:  [phone]  Name: Heather RobertsSylvia  P Wissinger MRN: [id number] Date of Birth: 08-29-1933

## 2016-04-24 ENCOUNTER — Ambulatory Visit (HOSPITAL_COMMUNITY): Payer: Medicare Other

## 2016-04-24 ENCOUNTER — Telehealth (HOSPITAL_COMMUNITY): Payer: Self-pay

## 2016-04-27 ENCOUNTER — Ambulatory Visit (HOSPITAL_COMMUNITY): Payer: Medicare Other

## 2016-04-27 NOTE — Telephone Encounter (Signed)
No show, called and left message about missed apt.  Included next apt date and time with contact info given.    Casey Cockerham, LPTA; CBIS [phone]  

## 2016-05-01 ENCOUNTER — Ambulatory Visit (HOSPITAL_COMMUNITY): Payer: Medicare Other | Admitting: Physical Therapy

## 2016-05-01 ENCOUNTER — Telehealth (HOSPITAL_COMMUNITY): Payer: Self-pay

## 2016-05-04 ENCOUNTER — Ambulatory Visit (HOSPITAL_COMMUNITY): Payer: Medicare Other | Attending: Orthopedic Surgery

## 2016-05-04 ENCOUNTER — Telehealth (HOSPITAL_COMMUNITY): Payer: Self-pay

## 2016-05-04 NOTE — Telephone Encounter (Signed)
Called home number after [facility]. Left message. No upcoming visits scheduled with PT.   3:06 PM, 05/04/16 Rosamaria Lints, PT, DPT Physical Therapist at [facility] [phone] (office)

## 2016-06-19 DIAGNOSIS — E039 Hypothyroidism, unspecified: Secondary | ICD-10-CM | POA: Diagnosis not present

## 2016-06-27 DIAGNOSIS — E030 Congenital hypothyroidism with diffuse goiter: Secondary | ICD-10-CM | POA: Diagnosis not present

## 2016-06-27 DIAGNOSIS — R55 Syncope and collapse: Secondary | ICD-10-CM | POA: Diagnosis not present

## 2016-06-27 DIAGNOSIS — Z23 Encounter for immunization: Secondary | ICD-10-CM | POA: Diagnosis not present

## 2016-07-10 ENCOUNTER — Ambulatory Visit (INDEPENDENT_AMBULATORY_CARE_PROVIDER_SITE_OTHER): Payer: Medicare Other

## 2016-07-10 ENCOUNTER — Encounter: Payer: Self-pay | Admitting: Orthopedic Surgery

## 2016-07-10 ENCOUNTER — Ambulatory Visit (INDEPENDENT_AMBULATORY_CARE_PROVIDER_SITE_OTHER): Payer: Medicare Other | Admitting: Orthopedic Surgery

## 2016-07-10 VITALS — BP 158/85 | HR 81 | Wt 157.0 lb

## 2016-07-10 DIAGNOSIS — M25552 Pain in left hip: Secondary | ICD-10-CM | POA: Diagnosis not present

## 2016-07-10 DIAGNOSIS — M1612 Unilateral primary osteoarthritis, left hip: Secondary | ICD-10-CM

## 2016-07-10 NOTE — Patient Instructions (Addendum)
Total Hip Replacement Total hip replacement is a surgical procedure to remove damaged bone in your hip joint and replace it with an artificial hip joint (prosthetic hip joint). The purpose of this surgery is to reduce pain and to improve your hip function.  During a total hip replacement, one or both parts of the hip joint are replaced, depending on the type of joint damage you have. The hip is a ball-and-socket type of joint, and it has two main parts. The ball part of the joint (femoral head) is the top of the thigh bone (femur). The socket part of the joint is a large indent in the side of your pelvis (acetabulum) where the femur and pelvis meet. LET [facility] CARE PROVIDER KNOW ABOUT:  Any allergies you have.  All medicines you are taking, including vitamins, herbs, eye drops, creams, and over-the-counter medicines.  Previous problems you or members of your family have had with the use of anesthetics.  Any blood disorders you have.  Previous surgeries you have had.  Medical conditions you have. RISKS AND COMPLICATIONS  Generally, total hip replacement is a safe procedure. However, problems can occur, including:  Infection.  Dislocation (the ball of the hip-joint prosthesis comes out of contact with the socket).  Loosening of the piece (stem) that connects the prosthetic femoral head to the femur.  Fracture of the bone while inserting the prosthesis.  Formation of blood clots, which can break loose and travel to and injure your lungs (pulmonary embolus). BEFORE THE PROCEDURE   Plan to have someone take you home after the procedure.  Do not eat or drink anything after midnight on the night before the procedure or as directed by your health care provider.  Ask your health care provider about:  Changing or stopping your regular medicines. This is especially important if you are taking diabetes medicines or blood thinners.  Taking medicines such as aspirin and ibuprofen. These  medicines can thin your blood. Do not take these medicines before your procedure if your health care provider asks you not to.  Ask your health care provider about how your surgical site will be marked or identified.  You may be given antibiotic medicines to help prevent infection. PROCEDURE   To reduce your risk of infection:  Your health care team will wash or sanitize their hands.  Your skin will be washed with soap.  An IV tube will be inserted into one of your veins. You will be given one or more of the following:  A medicine that makes you drowsy (sedative).  A medicine that makes you fall asleep (general anesthetic).  A medicine injected into your spine that numbs your body below the waist (spinal anesthetic).  An incision will be made in your hip. Your surgeon will take out any damaged cartilage and bone.  Your surgeon will then:  Insert a prosthetic socket into the acetabulum of your pelvis. This is usually secured with screws.  Remove the femoral head and replace it with a prosthetic ball and stem secured into the top of your femur.  Place the ball into the socket and check the range of motion and stability of your new hip.  Close the incision and apply a bandage over the surgical site. AFTER THE PROCEDURE   You will stay in a recovery area until the medicines have worn off.  Your vital signs, such as your pulse and blood pressure, will be monitored.  Once you are awake and stable, you will  be taken to a hospital room.  You may be directed to take actions to help prevent blood clots. These may include:  Walking soon after surgery, with someone assisting you. Moving around after surgery helps to improve blood flow.  Taking medicines to thin your blood (anticoagulants).  Wearing compression stockings or using different types of devices.  You will receive physical therapy until you are doing well and your health care provider feels it is safe for you to go  home.   This information is not intended to replace advice given to you by your health care provider. Make sure you discuss any questions you have with your health care provider.   Document Released: 12/25/2000 Document Revised: 06/09/2015 Document Reviewed: 11/19/2013 Elsevier Interactive Patient Education 2016 [employer]   You have decided to proceed with hip replacement surgery. You have decided not to continue with nonoperative measures such as but not limited to oral medication, weight loss, activity modification, physical therapy, bracing, or injection.  We will perform the procedure commonly known as total hip replacement. Some of the risks associated with total hip replacement include but are not limited to Bleeding Infection Swelling Stiffness Blood clot Pain Dislocation LEG LENGTH INEQUALITY REQUIRING A SHOE LIFT  Infection is a devastating complication of joint replacement surgery. If you get an infection the implant usually will have to be removed and several surgeries and antibiotics will be needed to eradicate the infection. In some rare cases the hip cannot be reconstructed with another implant. This will lead to permanent need for crutches and assistive devices to ambulate.   If you're not comfortable with these risks and would like to continue with nonoperative treatment please let Dr. Romeo AppleHarrison know prior to your surgery.

## 2016-07-10 NOTE — Progress Notes (Signed)
Patient ID: [name], female   DOB: April 18, 1933, 82 y.o.   MRN: [id number]  Chief Complaint  Patient presents with  . Follow-up    LEFT HIP PAIN, DISCUSS SURGERY    HPI [name] is a 82 y.o. female.  Presents for rediscussion of left hip. The patient's left hip pain is worsening it's over the left groin and left thigh moderate to severe now for over a year constant associated with weightbearing. She notices loss of motion. Deterioration in her activities of daily living such as standing walking or getting up  Review of Systems Review of Systems  Constitutional: Negative for fever.  Respiratory: Negative for shortness of breath.   Cardiovascular: Negative for chest pain and leg swelling.     Past Medical History:  Diagnosis Date  . Arthritis   . Hypertension   . Thyroid disease     Past Surgical History:  Procedure Laterality Date  . BREAST SURGERY  1993   L BREAST BX - BENIGN  . HIP CLOSED REDUCTION Right 06/20/2015   Procedure: CLOSED REDUCTION RIGHT HIP;  Surgeon: Vickki HearingStanley E Nunzio Banet, MD;  Location: [facility];  Service: Orthopedics;  Laterality: Right;  . TONSILLECTOMY    . TOTAL HIP ARTHROPLASTY Right 05/12/2015   Procedure: RIGHT TOTAL HIP ARTHROPLASTY;  Surgeon: Ranee Gosselinonald Gioffre, MD;  Location: [facility];  Service: Orthopedics;  Laterality: Right;  . TUBAL LIGATION      Social History Social History  Substance Use Topics  . Smoking status: Never Smoker  . Smokeless tobacco: Not on file  . Alcohol use Yes     Comment: RARE    Allergies  Allergen Reactions  . Sulfa Antibiotics Other (See Comments)    "like pens and needles all over my body, tingling"--pt states she isnt sure if she is allergic to it because she was on another medication at the same time.     Current Meds  Medication Sig  . escitalopram (LEXAPRO) 10 MG tablet Take 10 mg by mouth daily.  Marland Kitchen. levothyroxine (SYNTHROID, LEVOTHROID) 112 MCG tablet Take 112 mcg by mouth daily before breakfast.   . lisinopril-hydrochlorothiazide (PRINZIDE,ZESTORETIC) 20-25 MG per tablet Take 1 tablet by mouth daily.   . traZODone (DESYREL) 50 MG tablet Take 100 mg by mouth at bedtime.      Physical Exam Physical Exam BP (!) 158/85   Pulse 81   Wt 157 lb (71.2 kg)   BMI 25.34 kg/m   Gen. appearance. The patient is well-developed and well-nourished, grooming and hygiene are normal. There are no gross congenital abnormalities  The patient is alert and oriented to person place and time  Mood and affect are normal  Ambulation supported by a cane severe limp  Examination reveals the following: On inspection we find painful range of motion left hip with decreased internal rotation, flexion 110 with pain, hip stable. Strength tests were left hip flexion normal abduction normal  Skin we find no rash ulceration or erythema  Sensation remains intact  Impression vascular system shows no peripheral edema  Leg length discrepancy left short Data Reviewed Pelvic x-ray to determine leg lengths  Assessment    Left hip osteoarthritis  Plan Left total hip arthroplasty    Plan    This procedure has been fully reviewed with the patient and written informed consent has been obtained.        [name] 07/10/2016, 10:46 AM

## 2016-07-11 ENCOUNTER — Telehealth: Payer: Self-pay | Admitting: Orthopedic Surgery

## 2016-07-11 NOTE — Telephone Encounter (Signed)
Patient called to say she would like to schedule her surgery. She wants to be sure she has people to help her during her recovery time.   Please call and advise.

## 2016-07-12 ENCOUNTER — Other Ambulatory Visit: Payer: Self-pay | Admitting: *Deleted

## 2016-07-12 NOTE — Telephone Encounter (Signed)
Spoke with patient, looking at Nov 16 for surgery, will speak with family and call back to confirm

## 2016-07-13 ENCOUNTER — Telehealth: Payer: Self-pay | Admitting: Orthopedic Surgery

## 2016-07-13 NOTE — Telephone Encounter (Signed)
Returned call, no answer.

## 2016-07-13 NOTE — Telephone Encounter (Signed)
CONFIRMED SURGERY, PRE OP AND POST OP DATES

## 2016-07-13 NOTE — Telephone Encounter (Signed)
Patient would like for you to give her a call. She is wanting to talk with you about her surgery on 11/16.

## 2016-08-03 ENCOUNTER — Encounter: Payer: Self-pay | Admitting: *Deleted

## 2016-08-03 NOTE — Progress Notes (Unsigned)
PER [employer] PRE CERTIFICATION NOT REQUIRED PRIOR TO SURGERY DATE  REFERENCE RAY A. 08/03/16 11AM

## 2016-08-09 NOTE — Patient Instructions (Signed)
Cheryl RobertsSylvia P Griffith  08/09/2016     @PREFPERIOPPHARMACY @   Your procedure is scheduled on  08/17/2016   [name]  [facility].  Call this number if you have problems the morning of surgery:  [phone]   Remember:  Do not eat food or drink liquids after midnight.  Take these medicines the morning of surgery with A SIP OF WATER  Levothyroxine, lisinopril.   Do not wear jewelry, make-up or nail polish.  Do not wear lotions, powders, or perfumes, or deoderant.  Do not shave 48 hours prior to surgery.  Men may shave face and neck.  Do not bring valuables to the hospital.  [facility] any belongings or valuables.  Contacts, dentures or bridgework may not be worn into surgery.  Leave your suitcase in the car.  After surgery it may be brought to your room.  For patients admitted to the hospital, discharge time will be determined by your treatment team.  Patients discharged the day of surgery will not be allowed to drive home.   Name and phone number of your driver:   family Special instructions:  none  Please read over the following fact sheets that you were given. Anesthesia Post-op Instructions and Care and Recovery After Surgery       Total Hip Replacement Total hip replacement is a surgical procedure to remove damaged bone in your hip joint and replace it with an artificial hip joint (prosthetic hip joint). The purpose of this surgery is to reduce pain and to improve your hip function.  During a total hip replacement, one or both parts of the hip joint are replaced, depending on the type of joint damage you have. The hip is a ball-and-socket type of joint, and it has two main parts. The ball part of the joint (femoral head) is the top of the thigh bone (femur). The socket part of the joint is a large indent in the side of your pelvis (acetabulum) where the femur and pelvis meet. [name] KNOW ABOUT:  Any  allergies you have.  All medicines you are taking, including vitamins, herbs, eye drops, creams, and over-the-counter medicines.  Previous problems you or members of your family have had with the use of anesthetics.  Any blood disorders you have.  Previous surgeries you have had.  Medical conditions you have. RISKS AND COMPLICATIONS  Generally, total hip replacement is a safe procedure. However, problems can occur, including:  Infection.  Dislocation (the ball of the hip-joint prosthesis comes out of contact with the socket).  Loosening of the piece (stem) that connects the prosthetic femoral head to the femur.  Fracture of the bone while inserting the prosthesis.  Formation of blood clots, which can break loose and travel to and injure your lungs (pulmonary embolus). BEFORE THE PROCEDURE   Plan to have someone take you home after the procedure.  Do not eat or drink anything after midnight on the night before the procedure or as directed by your health care provider.  Ask your health care provider about:  Changing or stopping your regular medicines. This is especially important if you are taking diabetes medicines or blood thinners.  Taking medicines such as aspirin and ibuprofen. These medicines can thin your blood. Do not take these medicines before your procedure if your health care provider asks you not to.  Ask your health care provider about how  your surgical site will be marked or identified.  You may be given antibiotic medicines to help prevent infection. PROCEDURE   To reduce your risk of infection:  Your health care team will wash or sanitize their hands.  Your skin will be washed with soap.  An IV tube will be inserted into one of your veins. You will be given one or more of the following:  A medicine that makes you drowsy (sedative).  A medicine that makes you fall asleep (general anesthetic).  A medicine injected into your spine that numbs your body  below the waist (spinal anesthetic).  An incision will be made in your hip. Your surgeon will take out any damaged cartilage and bone.  Your surgeon will then:  Insert a prosthetic socket into the acetabulum of your pelvis. This is usually secured with screws.  Remove the femoral head and replace it with a prosthetic ball and stem secured into the top of your femur.  Place the ball into the socket and check the range of motion and stability of your new hip.  Close the incision and apply a bandage over the surgical site. AFTER THE PROCEDURE   You will stay in a recovery area until the medicines have worn off.  Your vital signs, such as your pulse and blood pressure, will be monitored.  Once you are awake and stable, you will be taken to a hospital room.  You may be directed to take actions to help prevent blood clots. These may include:  Walking soon after surgery, with someone assisting you. Moving around after surgery helps to improve blood flow.  Taking medicines to thin your blood (anticoagulants).  Wearing compression stockings or using different types of devices.  You will receive physical therapy until you are doing well and your health care provider feels it is safe for you to go home.   This information is not intended to replace advice given to you by your health care provider. Make sure you discuss any questions you have with your health care provider.   Document Released: 12/25/2000 Document Revised: 06/09/2015 Document Reviewed: 11/19/2013 Elsevier Interactive Patient Education 2016 [employer] Total Hip Replacement, Care After Refer to this sheet in the next few weeks. These instructions provide you with information on caring for yourself after your procedure. Your health care provider may also give you specific instructions. Your treatment has been planned according to the most current medical practices, but problems sometimes occur. Call your health care provider  if you have any problems or questions after your procedure. HOME CARE INSTRUCTIONS  Your health care provider will give you specific precautions for certain types of movement. Additional instructions include:  Take medicines only as directed by your health care provider.  Do not take baths, swim, or use a hot tub until your health care provider approves.  Avoid lifting until your health care provider instructs you otherwise.  Use a raised toilet seat and avoid sitting in low chairs as instructed by your health care provider.  Use crutches or a walker as instructed by your health care provider.  Rest often, but move around as much as you can tolerate. Movement helps you to heal and helps to prevent stiffness, skin sores, and blood clots.  Wear compression stockings as told by your health care provider. These stockings help to prevent blood clots and to reduce swelling in your legs.  Follow instructions from your health care provider about how to take care of your incision. Make  sure you:  Wash your hands with soap and water before you change your bandage (dressing). If soap and water are not available, use hand sanitizer.  Change your dressing as told by your health care provider.  Leave stitches (sutures), skin glue, or adhesive strips in place. These skin closures may need to be in place for 2 weeks or longer. If adhesive strip edges start to loosen and curl up, you may trim the loose edges. Do not remove adhesive strips completely unless your health care provider tells you to do that. SEEK MEDICAL CARE IF:  You have difficulty breathing.  You have drainage, redness, or swelling at your incision site.  You have a bad smell coming from your incision site.  You have persistent bleeding from your incision site.  Your incision breaks open after sutures (stitches) or staples have been removed.  You have a fever. SEEK IMMEDIATE MEDICAL CARE IF:   You have a rash.  You have pain  or swelling in your calf or thigh.  You have shortness of breath or chest pain.   This information is not intended to replace advice given to you by your health care provider. Make sure you discuss any questions you have with your health care provider.   Document Released: 04/07/2005 Document Revised: 06/09/2015 Document Reviewed: 11/19/2013 Elsevier Interactive Patient Education 2016 [employer]. PATIENT INSTRUCTIONS POST-ANESTHESIA  IMMEDIATELY FOLLOWING SURGERY:  Do not drive or operate machinery for the first twenty four hours after surgery.  Do not make any important decisions for twenty four hours after surgery or while taking narcotic pain medications or sedatives.  If you develop intractable nausea and vomiting or a severe headache please notify your doctor immediately.  FOLLOW-UP:  Please make an appointment with your surgeon as instructed. You do not need to follow up with anesthesia unless specifically instructed to do so.  WOUND CARE INSTRUCTIONS (if applicable):  Keep a dry clean dressing on the anesthesia/puncture wound site if there is drainage.  Once the wound has quit draining you may leave it open to air.  Generally you should leave the bandage intact for twenty four hours unless there is drainage.  If the epidural site drains for more than 36-48 hours please call the anesthesia department.  QUESTIONS?:  Please feel free to call your physician or the hospital operator if you have any questions, and they will be happy to assist you.

## 2016-08-11 ENCOUNTER — Encounter (HOSPITAL_COMMUNITY)
Admission: RE | Admit: 2016-08-11 | Discharge: 2016-08-11 | Disposition: A | Discharge status: Home / Self Care | Payer: Medicare Other | Source: Ambulatory Visit | Attending: Orthopedic Surgery | Admitting: Orthopedic Surgery

## 2016-08-11 ENCOUNTER — Other Ambulatory Visit: Payer: Self-pay

## 2016-08-11 ENCOUNTER — Encounter (HOSPITAL_COMMUNITY): Payer: Self-pay

## 2016-08-11 DIAGNOSIS — Z01810 Encounter for preprocedural cardiovascular examination: Secondary | ICD-10-CM | POA: Diagnosis not present

## 2016-08-11 DIAGNOSIS — M1990 Unspecified osteoarthritis, unspecified site: Secondary | ICD-10-CM | POA: Insufficient documentation

## 2016-08-11 DIAGNOSIS — E079 Disorder of thyroid, unspecified: Secondary | ICD-10-CM | POA: Insufficient documentation

## 2016-08-11 DIAGNOSIS — F419 Anxiety disorder, unspecified: Secondary | ICD-10-CM | POA: Insufficient documentation

## 2016-08-11 DIAGNOSIS — Z9889 Other specified postprocedural states: Secondary | ICD-10-CM | POA: Insufficient documentation

## 2016-08-11 DIAGNOSIS — Z01812 Encounter for preprocedural laboratory examination: Secondary | ICD-10-CM | POA: Diagnosis not present

## 2016-08-11 DIAGNOSIS — I10 Essential (primary) hypertension: Secondary | ICD-10-CM | POA: Insufficient documentation

## 2016-08-11 DIAGNOSIS — Z96649 Presence of unspecified artificial hip joint: Secondary | ICD-10-CM | POA: Insufficient documentation

## 2016-08-11 HISTORY — DX: Anxiety disorder, unspecified: F41.9

## 2016-08-11 LAB — CBC WITH DIFFERENTIAL/PLATELET
BASOS ABS: 0 10*3/uL (ref 0.0–0.1)
BASOS PCT: 0 %
EOS ABS: 0.1 10*3/uL (ref 0.0–0.7)
Eosinophils Relative: 2 %
HEMATOCRIT: 34.7 % — AB (ref 36.0–46.0)
HEMOGLOBIN: 11.4 g/dL — AB (ref 12.0–15.0)
Lymphocytes Relative: 35 %
Lymphs Abs: 2.6 10*3/uL (ref 0.7–4.0)
MCH: 27 pg (ref 26.0–34.0)
MCHC: 32.9 g/dL (ref 30.0–36.0)
MCV: 82 fL (ref 78.0–100.0)
Monocytes Absolute: 0.5 10*3/uL (ref 0.1–1.0)
Monocytes Relative: 6 %
NEUTROS ABS: 4.3 10*3/uL (ref 1.7–7.7)
NEUTROS PCT: 57 %
Platelets: 243 10*3/uL (ref 150–400)
RBC: 4.23 MIL/uL (ref 3.87–5.11)
RDW: 14 % (ref 11.5–15.5)
WBC: 7.5 10*3/uL (ref 4.0–10.5)

## 2016-08-11 LAB — APTT: aPTT: 31 seconds (ref 24–36)

## 2016-08-11 LAB — BASIC METABOLIC PANEL
ANION GAP: 8 (ref 5–15)
BUN: 18 mg/dL (ref 6–20)
CALCIUM: 9.4 mg/dL (ref 8.9–10.3)
CHLORIDE: 99 mmol/L — AB (ref 101–111)
CO2: 25 mmol/L (ref 22–32)
CREATININE: 1.14 mg/dL — AB (ref 0.44–1.00)
GFR calc non Af Amer: 44 mL/min — ABNORMAL LOW (ref >60)
GFR, EST AFRICAN AMERICAN: 51 mL/min — AB (ref >60)
Glucose, Bld: 100 mg/dL — ABNORMAL HIGH (ref 65–99)
Potassium: 3.8 mmol/L (ref 3.5–5.1)
SODIUM: 132 mmol/L — AB (ref 135–145)

## 2016-08-11 LAB — PROTIME-INR
INR: 1.01
PROTHROMBIN TIME: 13.3 s (ref 11.4–15.2)

## 2016-08-11 LAB — SURGICAL PCR SCREEN
MRSA, PCR: NEGATIVE
STAPHYLOCOCCUS AUREUS: NEGATIVE

## 2016-08-11 LAB — ABO/RH: ABO/RH(D): A NEG

## 2016-08-12 LAB — PREPARE RBC (CROSSMATCH)

## 2016-08-15 NOTE — H&P (Addendum)
TOTAL HIP ADMISSION H&P  Patient is admitted for left total hip arthroplasty.  Subjective:  Chief Complaint: left hip pain  HPI: Cheryl RobertsSylvia P Griffith, 82 y.o. female, has a history of pain and functional disability in the left hip(s) due to arthritis and patient has failed non-surgical conservative treatments for greater than 12 weeks to include NSAID's and/or analgesics and activity modification.  Onset of symptoms was gradual starting 1 years ago with gradually worsening course since that time.The patient noted prior procedures of the hip to include right tha  posterior approach  on the right hip(s) complicated by dislocation treated with closed reduction successfully.  Patient currently rates pain in the right hip at 7 out of 10 with activity. Patient has night pain, worsening of pain with activity and weight bearing, pain that interfers with activities of daily living, pain with passive range of motion and crepitus. Patient has evidence of subchondral sclerosis, periarticular osteophytes and joint space narrowing by imaging studies. This condition presents safety issues increasing the risk of falls.  There is no current active infection.  Patient Active Problem List   Diagnosis Date Noted  . S/P closed reduction of dislocated total hip prothesis-right    . H/O right total hip arthroplasty 05/12/2015   Past Medical History:  Diagnosis Date  . Anxiety   . Arthritis   . Hypertension   . Thyroid disease     Past Surgical History:  Procedure Laterality Date  . BREAST SURGERY  1993   L BREAST BX - BENIGN  . HIP CLOSED REDUCTION Right 06/20/2015   Procedure: CLOSED REDUCTION RIGHT HIP;  Surgeon: Vickki HearingStanley E Chukwuebuka Churchill, MD;  Location: [facility];  Service: Orthopedics;  Laterality: Right;  . TONSILLECTOMY    . TOTAL HIP ARTHROPLASTY Right 05/12/2015   Procedure: RIGHT TOTAL HIP ARTHROPLASTY;  Surgeon: Ranee Gosselinonald Gioffre, MD;  Location: [facility];  Service: Orthopedics;  Laterality: Right;  . TUBAL LIGATION       No prescriptions prior to admission.   Allergies  Allergen Reactions  . Sulfa Antibiotics Other (See Comments)    "like pens and needles all over my body, tingling"--pt states she isnt sure if she is allergic to it because she was on another medication at the same time.     Social History  Substance Use Topics  . Smoking status: Never Smoker  . Smokeless tobacco: Not on file  . Alcohol use Yes     Comment: RARE    No family history of anesthetic complications  Review of Systems  Musculoskeletal: Positive for back pain, joint pain and myalgias.  All other systems reviewed and are negative.   Objective:  Physical Exam  Constitutional: She is oriented to person, place, and time. She appears well-nourished.  Eyes: Right eye exhibits no discharge. Left eye exhibits no discharge. No scleral icterus.  Neck: Neck supple. No JVD present. No tracheal deviation present.  Cardiovascular: Intact distal pulses.   Respiratory: Effort normal. No stridor.  GI: Soft. She exhibits no distension.  Musculoskeletal:  Right and Left arms normal alignment rom and strength; all joints reduced without subluxation  The patient has 1 cm leg length discrepancy shorter on the left. Her right hip incision is well-healed it's a posterior approach incision.  She has crepitance on range of motion on the left but the hip is stable. She has good muscle tone and strength skin is normal pulses are good sensation is intact she has no adenopathy in the groin she has tenderness over the  anterior hip joint. Painful range of motion is noted with internal rotation flexion is greater than 110   Neurological: She is alert and oriented to person, place, and time. She has normal reflexes. She exhibits normal muscle tone. Coordination normal.  Skin: Skin is warm and dry. No rash noted. No erythema. No pallor.  Psychiatric: She has a normal mood and affect. Her behavior is normal. Thought content normal.    Vital  signs in last 24 hours:    Labs:   Estimated body mass index is 24.9 kg/m as calculated from the following:   Height as of 08/11/16: 5\' 7"  (1.702 m).   Weight as of 08/11/16: 159 lb (72.1 kg).   Imaging Review Plain radiographs demonstrate severe degenerative joint disease of the left hip(s). The bone quality appears to be good for age and reported activity level.  Assessment/Plan:  End stage arthritis, left hip(s)  Left total hip arthroplasty  The patient history, physical examination, clinical judgement of the provider and imaging studies are consistent with end stage degenerative joint disease of the left hip(s) and total hip arthroplasty is deemed medically necessary. The treatment options including medical management, injection therapy, arthroscopy and arthroplasty were discussed at length. The risks and benefits of total hip arthroplasty were presented and reviewed. The risks due to aseptic loosening, infection, stiffness, dislocation/subluxation,  thromboembolic complications and other imponderables were discussed.  The patient acknowledged the explanation, agreed to proceed with the plan and consent was signed. Patient is being admitted for inpatient treatment for surgery, pain control, PT, OT, prophylactic antibiotics, VTE prophylaxis, progressive ambulation and ADL's and discharge planning.The patient discharge disposition Is unclear

## 2016-08-16 MED ORDER — TRANEXAMIC ACID 1000 MG/10ML IV SOLN
1000.0000 mg | INTRAVENOUS | Status: AC
  Administered 2016-08-17: 1000 mg via INTRAVENOUS
  Filled 2016-08-16: qty 10

## 2016-08-17 ENCOUNTER — Encounter (HOSPITAL_COMMUNITY): Payer: Self-pay | Admitting: *Deleted

## 2016-08-17 ENCOUNTER — Inpatient Hospital Stay (HOSPITAL_COMMUNITY)
Admission: RE | Admit: 2016-08-17 | Discharge: 2016-08-19 | DRG: 470 | Disposition: A | Discharge status: Home Health | Payer: Medicare Other | Source: Ambulatory Visit | Attending: Orthopedic Surgery | Admitting: Orthopedic Surgery

## 2016-08-17 ENCOUNTER — Inpatient Hospital Stay (HOSPITAL_COMMUNITY): Payer: Medicare Other

## 2016-08-17 ENCOUNTER — Encounter (HOSPITAL_COMMUNITY)
Admission: RE | Disposition: A | Discharge status: Home Health | Payer: Self-pay | Source: Ambulatory Visit | Attending: Orthopedic Surgery

## 2016-08-17 ENCOUNTER — Inpatient Hospital Stay (HOSPITAL_COMMUNITY): Payer: Medicare Other | Admitting: Anesthesiology

## 2016-08-17 DIAGNOSIS — F419 Anxiety disorder, unspecified: Secondary | ICD-10-CM | POA: Diagnosis present

## 2016-08-17 DIAGNOSIS — M1612 Unilateral primary osteoarthritis, left hip: Principal | ICD-10-CM

## 2016-08-17 DIAGNOSIS — Z96641 Presence of right artificial hip joint: Secondary | ICD-10-CM | POA: Diagnosis present

## 2016-08-17 DIAGNOSIS — Z882 Allergy status to sulfonamides status: Secondary | ICD-10-CM

## 2016-08-17 DIAGNOSIS — I10 Essential (primary) hypertension: Secondary | ICD-10-CM | POA: Diagnosis present

## 2016-08-17 DIAGNOSIS — Z79899 Other long term (current) drug therapy: Secondary | ICD-10-CM | POA: Diagnosis not present

## 2016-08-17 DIAGNOSIS — Z96642 Presence of left artificial hip joint: Secondary | ICD-10-CM | POA: Diagnosis not present

## 2016-08-17 DIAGNOSIS — Z471 Aftercare following joint replacement surgery: Secondary | ICD-10-CM | POA: Diagnosis not present

## 2016-08-17 DIAGNOSIS — M8568 Other cyst of bone, other site: Secondary | ICD-10-CM | POA: Diagnosis present

## 2016-08-17 DIAGNOSIS — M6281 Muscle weakness (generalized): Secondary | ICD-10-CM

## 2016-08-17 DIAGNOSIS — E039 Hypothyroidism, unspecified: Secondary | ICD-10-CM | POA: Diagnosis present

## 2016-08-17 DIAGNOSIS — M79605 Pain in left leg: Secondary | ICD-10-CM

## 2016-08-17 DIAGNOSIS — R2689 Other abnormalities of gait and mobility: Secondary | ICD-10-CM

## 2016-08-17 HISTORY — PX: TOTAL HIP ARTHROPLASTY: SHX124

## 2016-08-17 SURGERY — ARTHROPLASTY, HIP, TOTAL,POSTERIOR APPROACH
Anesthesia: Spinal | Site: Hip | Laterality: Left

## 2016-08-17 MED ORDER — HYDROCODONE-ACETAMINOPHEN 5-325 MG PO TABS
1.0000 | ORAL_TABLET | ORAL | Status: DC | PRN
  Administered 2016-08-17 – 2016-08-18 (×3): 1 via ORAL
  Filled 2016-08-17 (×4): qty 1

## 2016-08-17 MED ORDER — BUPIVACAINE-EPINEPHRINE (PF) 0.5% -1:200000 IJ SOLN
INTRAMUSCULAR | Status: DC | PRN
  Administered 2016-08-17: 30 mL

## 2016-08-17 MED ORDER — CHLORHEXIDINE GLUCONATE 4 % EX LIQD: 60.0000 mL | Freq: Once | CUTANEOUS | Status: DC

## 2016-08-17 MED ORDER — METOCLOPRAMIDE HCL 10 MG PO TABS: 5.0000 mg | ORAL_TABLET | Freq: Three times a day (TID) | ORAL | Status: DC | PRN

## 2016-08-17 MED ORDER — BUPIVACAINE LIPOSOME 1.3 % IJ SUSP
INTRAMUSCULAR | Status: AC
  Filled 2016-08-17: qty 20

## 2016-08-17 MED ORDER — LISINOPRIL 10 MG PO TABS
10.0000 mg | ORAL_TABLET | Freq: Every day | ORAL | Status: DC
  Administered 2016-08-18 – 2016-08-19 (×2): 10 mg via ORAL
  Filled 2016-08-17 (×2): qty 1

## 2016-08-17 MED ORDER — LACTATED RINGERS IV SOLN
INTRAVENOUS | Status: DC | PRN
  Administered 2016-08-17: 08:00:00 via INTRAVENOUS

## 2016-08-17 MED ORDER — PROPOFOL 500 MG/50ML IV EMUL
INTRAVENOUS | Status: DC | PRN
  Administered 2016-08-17: 25 ug/kg/min via INTRAVENOUS

## 2016-08-17 MED ORDER — MAGNESIUM CITRATE PO SOLN: 1.0000 | Freq: Once | ORAL | Status: DC | PRN

## 2016-08-17 MED ORDER — FENTANYL CITRATE (PF) 100 MCG/2ML IJ SOLN
INTRAMUSCULAR | Status: AC
  Filled 2016-08-17: qty 2

## 2016-08-17 MED ORDER — MIDAZOLAM HCL 2 MG/2ML IJ SOLN
1.0000 mg | INTRAMUSCULAR | Status: DC | PRN
  Administered 2016-08-17: 2 mg via INTRAVENOUS

## 2016-08-17 MED ORDER — LIDOCAINE HCL (PF) 1 % IJ SOLN
INTRAMUSCULAR | Status: AC
  Filled 2016-08-17: qty 5

## 2016-08-17 MED ORDER — ONDANSETRON HCL 4 MG PO TABS: 4.0000 mg | ORAL_TABLET | Freq: Four times a day (QID) | ORAL | Status: DC | PRN

## 2016-08-17 MED ORDER — MIDAZOLAM HCL 5 MG/5ML IJ SOLN
INTRAMUSCULAR | Status: DC | PRN
  Administered 2016-08-17: 1 mg via INTRAVENOUS

## 2016-08-17 MED ORDER — METHOCARBAMOL 1000 MG/10ML IJ SOLN
500.0000 mg | Freq: Four times a day (QID) | INTRAVENOUS | Status: DC | PRN
  Filled 2016-08-17: qty 5

## 2016-08-17 MED ORDER — EPINEPHRINE PF 1 MG/ML IJ SOLN
INTRAMUSCULAR | Status: AC
  Filled 2016-08-17: qty 1

## 2016-08-17 MED ORDER — BUPIVACAINE HCL (PF) 0.5 % IJ SOLN
INTRAMUSCULAR | Status: AC
  Filled 2016-08-17: qty 30

## 2016-08-17 MED ORDER — POLYETHYLENE GLYCOL 3350 17 G PO PACK
17.0000 g | PACK | Freq: Every day | ORAL | Status: DC
  Administered 2016-08-17 – 2016-08-19 (×3): 17 g via ORAL
  Filled 2016-08-17 (×3): qty 1

## 2016-08-17 MED ORDER — FENTANYL CITRATE (PF) 100 MCG/2ML IJ SOLN
INTRAMUSCULAR | Status: DC | PRN
  Administered 2016-08-17: 25 ug via INTRAVENOUS
  Administered 2016-08-17: 25 ug via INTRATHECAL

## 2016-08-17 MED ORDER — SODIUM CHLORIDE 0.9 % IJ SOLN
INTRAMUSCULAR | Status: AC
  Filled 2016-08-17: qty 20

## 2016-08-17 MED ORDER — SODIUM CHLORIDE 0.9 % IJ SOLN
INTRAMUSCULAR | Status: AC
  Filled 2016-08-17: qty 40

## 2016-08-17 MED ORDER — HYDROCHLOROTHIAZIDE 12.5 MG PO CAPS
12.5000 mg | ORAL_CAPSULE | Freq: Every day | ORAL | Status: DC
  Administered 2016-08-18 – 2016-08-19 (×2): 12.5 mg via ORAL
  Filled 2016-08-17 (×2): qty 1

## 2016-08-17 MED ORDER — DEXAMETHASONE SODIUM PHOSPHATE 10 MG/ML IJ SOLN
10.0000 mg | Freq: Once | INTRAMUSCULAR | Status: AC
  Administered 2016-08-18: 10 mg via INTRAVENOUS
  Filled 2016-08-17: qty 1

## 2016-08-17 MED ORDER — DOCUSATE SODIUM 100 MG PO CAPS
100.0000 mg | ORAL_CAPSULE | Freq: Two times a day (BID) | ORAL | Status: DC
  Administered 2016-08-17 – 2016-08-19 (×5): 100 mg via ORAL
  Filled 2016-08-17 (×5): qty 1

## 2016-08-17 MED ORDER — EPHEDRINE SULFATE 50 MG/ML IJ SOLN
INTRAMUSCULAR | Status: AC
  Filled 2016-08-17: qty 2

## 2016-08-17 MED ORDER — PHENYLEPHRINE 40 MCG/ML (10ML) SYRINGE FOR IV PUSH (FOR BLOOD PRESSURE SUPPORT)
PREFILLED_SYRINGE | INTRAVENOUS | Status: AC
  Filled 2016-08-17: qty 10

## 2016-08-17 MED ORDER — BUPIVACAINE IN DEXTROSE 0.75-8.25 % IT SOLN
INTRATHECAL | Status: DC | PRN
  Administered 2016-08-17: 2 mL via INTRATHECAL

## 2016-08-17 MED ORDER — CEFAZOLIN IN D5W 1 GM/50ML IV SOLN
1.0000 g | Freq: Four times a day (QID) | INTRAVENOUS | Status: AC
  Administered 2016-08-17 (×2): 1 g via INTRAVENOUS
  Filled 2016-08-17 (×2): qty 50

## 2016-08-17 MED ORDER — MIDAZOLAM HCL 2 MG/2ML IJ SOLN
INTRAMUSCULAR | Status: AC
  Filled 2016-08-17: qty 2

## 2016-08-17 MED ORDER — PHENOL 1.4 % MT LIQD
1.0000 | OROMUCOSAL | Status: DC | PRN
Start: 2016-08-17 — End: 2016-08-19

## 2016-08-17 MED ORDER — HYDROMORPHONE HCL 1 MG/ML IJ SOLN: 0.2500 mg | INTRAMUSCULAR | Status: DC | PRN

## 2016-08-17 MED ORDER — SODIUM CHLORIDE 0.9 % IV SOLN
INTRAVENOUS | Status: DC | PRN
  Administered 2016-08-17: 60 mL

## 2016-08-17 MED ORDER — MENTHOL 3 MG MT LOZG: 1.0000 | LOZENGE | OROMUCOSAL | Status: DC | PRN

## 2016-08-17 MED ORDER — PHENYLEPHRINE HCL 10 MG/ML IJ SOLN
INTRAMUSCULAR | Status: DC | PRN
  Administered 2016-08-17 (×7): 80 ug via INTRAVENOUS

## 2016-08-17 MED ORDER — ALUM & MAG HYDROXIDE-SIMETH 200-200-20 MG/5ML PO SUSP: 30.0000 mL | ORAL | Status: DC | PRN

## 2016-08-17 MED ORDER — ONDANSETRON HCL 4 MG/2ML IJ SOLN: 4.0000 mg | Freq: Four times a day (QID) | INTRAMUSCULAR | Status: DC | PRN

## 2016-08-17 MED ORDER — LACTATED RINGERS IV SOLN
INTRAVENOUS | Status: DC
  Administered 2016-08-17: 800 mL via INTRAVENOUS

## 2016-08-17 MED ORDER — FENTANYL CITRATE (PF) 100 MCG/2ML IJ SOLN
25.0000 ug | INTRAMUSCULAR | Status: AC | PRN
  Administered 2016-08-17 (×2): 25 ug via INTRAVENOUS

## 2016-08-17 MED ORDER — SODIUM CHLORIDE 0.9 % IR SOLN
Status: DC | PRN
  Administered 2016-08-17: 3000 mL

## 2016-08-17 MED ORDER — LACTATED RINGERS IV SOLN
INTRAVENOUS | Status: DC
  Administered 2016-08-17: 1000 mL via INTRAVENOUS

## 2016-08-17 MED ORDER — PROPOFOL 10 MG/ML IV BOLUS
INTRAVENOUS | Status: AC
  Filled 2016-08-17: qty 20

## 2016-08-17 MED ORDER — SORBITOL 70 % SOLN: 30.0000 mL | Freq: Every day | Status: DC | PRN

## 2016-08-17 MED ORDER — ACETAMINOPHEN 500 MG PO TABS
1000.0000 mg | ORAL_TABLET | Freq: Four times a day (QID) | ORAL | Status: AC
  Administered 2016-08-17 (×2): 1000 mg via ORAL
  Filled 2016-08-17 (×2): qty 2

## 2016-08-17 MED ORDER — TRAZODONE HCL 50 MG PO TABS
100.0000 mg | ORAL_TABLET | Freq: Every day | ORAL | Status: DC
  Administered 2016-08-17 – 2016-08-18 (×2): 100 mg via ORAL
  Filled 2016-08-17 (×2): qty 2

## 2016-08-17 MED ORDER — LEVOTHYROXINE SODIUM 112 MCG PO TABS
112.0000 ug | ORAL_TABLET | Freq: Every day | ORAL | Status: DC
  Administered 2016-08-18 – 2016-08-19 (×2): 112 ug via ORAL
  Filled 2016-08-17 (×2): qty 1

## 2016-08-17 MED ORDER — METHOCARBAMOL 500 MG PO TABS: 500.0000 mg | ORAL_TABLET | Freq: Four times a day (QID) | ORAL | Status: DC | PRN

## 2016-08-17 MED ORDER — ASPIRIN EC 325 MG PO TBEC
325.0000 mg | DELAYED_RELEASE_TABLET | Freq: Two times a day (BID) | ORAL | Status: DC
  Administered 2016-08-17 – 2016-08-19 (×5): 325 mg via ORAL
  Filled 2016-08-17 (×5): qty 1

## 2016-08-17 MED ORDER — LISINOPRIL-HYDROCHLOROTHIAZIDE 10-12.5 MG PO TABS
1.0000 | ORAL_TABLET | Freq: Every day | ORAL | Status: DC
Start: 2016-08-17 — End: 2016-08-17

## 2016-08-17 MED ORDER — METOCLOPRAMIDE HCL 5 MG/ML IJ SOLN: 5.0000 mg | Freq: Three times a day (TID) | INTRAMUSCULAR | Status: DC | PRN

## 2016-08-17 MED ORDER — EPHEDRINE SULFATE 50 MG/ML IJ SOLN
INTRAMUSCULAR | Status: DC | PRN
  Administered 2016-08-17 (×8): 10 mg via INTRAVENOUS

## 2016-08-17 MED ORDER — CEFAZOLIN SODIUM-DEXTROSE 2-4 GM/100ML-% IV SOLN
2.0000 g | INTRAVENOUS | Status: AC
  Administered 2016-08-17: 2 g via INTRAVENOUS
  Filled 2016-08-17: qty 100

## 2016-08-17 MED ORDER — BUPIVACAINE LIPOSOME 1.3 % IJ SUSP
20.0000 mL | Freq: Once | INTRAMUSCULAR | Status: DC
  Filled 2016-08-17: qty 20

## 2016-08-17 MED ORDER — DIPHENHYDRAMINE HCL 12.5 MG/5ML PO ELIX: 12.5000 mg | ORAL_SOLUTION | ORAL | Status: DC | PRN

## 2016-08-17 MED ORDER — MORPHINE SULFATE (PF) 2 MG/ML IV SOLN
2.0000 mg | INTRAVENOUS | Status: DC | PRN
  Administered 2016-08-17 (×2): 2 mg via INTRAVENOUS
  Filled 2016-08-17 (×2): qty 1

## 2016-08-17 SURGICAL SUPPLY — 67 items
BIT DRILL 2.8X128 (BIT) ×2 IMPLANT
BIT DRILL 2.8X128MM (BIT) ×1
BLADE HEX COATED 2.75 (ELECTRODE) ×3 IMPLANT
BLADE SAGITTAL 25.0X1.27X90 (BLADE) ×2 IMPLANT
BLADE SAGITTAL 25.0X1.27X90MM (BLADE) ×1
BRUSH FEMORAL CANAL (MISCELLANEOUS) IMPLANT
CAPT HIP TOTAL 2 ×3 IMPLANT
CLOTH BEACON ORANGE TIMEOUT ST (SAFETY) ×3 IMPLANT
COVER LIGHT HANDLE STERIS (MISCELLANEOUS) ×6 IMPLANT
COVER PROBE W GEL 5X96 (DRAPES) ×3 IMPLANT
DECANTER SPIKE VIAL GLASS SM (MISCELLANEOUS) ×6 IMPLANT
DRAPE BACK TABLE (DRAPES) ×3 IMPLANT
DRAPE HIP W/POCKET STRL (DRAPE) ×3 IMPLANT
DRAPE INCISE IOBAN 44X35 STRL (DRAPES) ×3 IMPLANT
DRESSING AQUACEL AG ADV 3.5X12 (MISCELLANEOUS) ×1 IMPLANT
DRSG AQUACEL AG ADV 3.5X12 (MISCELLANEOUS) ×3
DRSG MEPILEX BORDER 4X12 (GAUZE/BANDAGES/DRESSINGS) ×3 IMPLANT
DURAPREP 26ML APPLICATOR (WOUND CARE) ×6 IMPLANT
ELECT REM PT RETURN 9FT ADLT (ELECTROSURGICAL) ×3
ELECTRODE REM PT RTRN 9FT ADLT (ELECTROSURGICAL) ×1 IMPLANT
EVACUATOR 3/16  PVC DRAIN (DRAIN)
EVACUATOR 3/16 PVC DRAIN (DRAIN) IMPLANT
GLOVE BIOGEL PI IND STRL 7.0 (GLOVE) ×1 IMPLANT
GLOVE BIOGEL PI INDICATOR 7.0 (GLOVE) ×2
GLOVE OPTIFIT SS 8.0 STRL (GLOVE) ×3 IMPLANT
GLOVE SKINSENSE NS SZ8.0 LF (GLOVE) ×4
GLOVE SKINSENSE STRL SZ8.0 LF (GLOVE) ×2 IMPLANT
GLOVE SS N UNI LF 8.5 STRL (GLOVE) ×3 IMPLANT
GOWN STRL REUS W/TWL LRG LVL3 (GOWN DISPOSABLE) ×9 IMPLANT
GOWN STRL REUS W/TWL XL LVL3 (GOWN DISPOSABLE) ×3 IMPLANT
HANDPIECE INTERPULSE COAX TIP (DISPOSABLE) ×2
HOOD W/PEELAWAY (MISCELLANEOUS) ×15 IMPLANT
INST SET MAJOR BONE (KITS) ×3 IMPLANT
IV NS IRRIG 3000ML ARTHROMATIC (IV SOLUTION) ×3 IMPLANT
KIT BLADEGUARD II DBL (SET/KITS/TRAYS/PACK) ×3 IMPLANT
KIT ROOM TURNOVER APOR (KITS) ×3 IMPLANT
MANIFOLD NEPTUNE II (INSTRUMENTS) ×3 IMPLANT
MARKER SKIN DUAL TIP RULER LAB (MISCELLANEOUS) ×6 IMPLANT
NEEDLE HYPO 18GX1.5 BLUNT FILL (NEEDLE) ×6 IMPLANT
NEEDLE HYPO 21X1.5 SAFETY (NEEDLE) ×3 IMPLANT
NEEDLE HYPO 25X1 1.5 SAFETY (NEEDLE) ×3 IMPLANT
NS IRRIG 1000ML POUR BTL (IV SOLUTION) ×3 IMPLANT
PACK TOTAL JOINT (CUSTOM PROCEDURE TRAY) ×3 IMPLANT
PAD ARMBOARD 7.5X6 YLW CONV (MISCELLANEOUS) ×3 IMPLANT
PASSER SUT SWANSON 36MM LOOP (INSTRUMENTS) IMPLANT
PILLOW HIP ABDUCTION LRG (ORTHOPEDIC SUPPLIES) IMPLANT
PILLOW HIP ABDUCTION MED (ORTHOPEDIC SUPPLIES) IMPLANT
PILLOW HIP ABDUCTION SM (ORTHOPEDIC SUPPLIES) ×3 IMPLANT
PIN STMN SNGL STERILE 9X3.6MM (PIN) ×6 IMPLANT
SET BASIN LINEN APH (SET/KITS/TRAYS/PACK) ×3 IMPLANT
SET HNDPC FAN SPRY TIP SCT (DISPOSABLE) ×1 IMPLANT
SPONGE LAP 18X18 X RAY DECT (DISPOSABLE) ×3 IMPLANT
STAPLER VISISTAT 35W (STAPLE) ×3 IMPLANT
SUT BRALON NAB BRD #1 30IN (SUTURE) ×6 IMPLANT
SUT ETHIBOND 5 LR DA (SUTURE) ×6 IMPLANT
SUT MNCRL 0 VIOLET CTX 36 (SUTURE) ×1 IMPLANT
SUT MON AB 2-0 CT1 36 (SUTURE) ×3 IMPLANT
SUT MONOCRYL 0 CTX 36 (SUTURE) ×2
SUT VIC AB 1 CT1 27 (SUTURE) ×4
SUT VIC AB 1 CT1 27XBRD ANTBC (SUTURE) ×2 IMPLANT
SYR 20CC LL (SYRINGE) ×9 IMPLANT
SYR 30ML LL (SYRINGE) ×3 IMPLANT
SYR BULB IRRIGATION 50ML (SYRINGE) ×3 IMPLANT
TOWEL OR 17X26 4PK STRL BLUE (TOWEL DISPOSABLE) ×3 IMPLANT
TOWER CARTRIDGE SMART MIX (DISPOSABLE) IMPLANT
TRAY FOLEY CATH SILVER 16FR (SET/KITS/TRAYS/PACK) ×3 IMPLANT
YANKAUER SUCT 12FT TUBE ARGYLE (SUCTIONS) ×3 IMPLANT

## 2016-08-17 NOTE — Brief Op Note (Signed)
08/17/2016  10:12 AM  PATIENT:  Cheryl RobertsSylvia P Griffith  82 y.o. female  PRE-OPERATIVE DIAGNOSIS:  LEFT HIP OSTEOARTHRITIS  POST-OPERATIVE DIAGNOSIS:  LEFT HIP OSTEOARTHRITIS  Findings severe osteoarthritis of the left hip with characteristic changes in the head. The head was mouth shape and. There was pitting and cyst formation. The acetabular cartilage was irregular as was the femoral cartilage.  Implants size 4 Tri-Lock high offset Depew stem with a 52 Pinnacle revision cup with one screw area 36 mm head and highly cross-linked polyethylene  Surgical dictation for  left hip replacement  The patient was identified in the preoperative holding area. The site was marked. The chart was reviewed and updated. The patient was taken to the operating room for spinal anesthesia. A Foley catheter was inserted. The patient was placed in lateral decubitus position with the operative site up. Stulberg positioner was used to hold the patient in place and axillary roll was provided as needed.  Standard preoperative antibiotics were given per protocol using SCIP recommendations.  After the sterile prep and drape the timeout was completed. All implants were accounted for x-rays were visible and everyone agreed on the surgical site left hip    The incision was made over the greater trochanter and deepened through subcutaneous tissue. The fascia was exposed and incised in line with the skin incision. The greater trochanteric bursa was excised. Blunt dissection was carried out in line of the fibers of the gluteus medius. The gluteus medius was subperiosteally dissected from the greater trochanter in line with the vastus lateralis and included the gluteus minimus. These structures were tagged and retracted proximally. 2 Steinmann pins were placed in the pelvis as retractors.  The hip capsule was excised and the hip was exposed. The hip was dislocated anteriorly. A cutting guide was used to perform the proximal neck  cut. The head was removed.  The acetabulum was evaluated and labrum and osteophytes were resected anteriorly and posterior retractors were placed.  Reaming was initiated with a 43 mm reamer and progressively this increased up to a size 52. We then placed a trial acetabular shell 1 size down from the last reaming.  Bone graft was placed  The acetabular shell was placed and checked for security.  Screw 1 x 25 mm was inserted    We next turned our attention to the femur. A starter hole reamer was passed followed by box osteotome followed by canal finder.  We broached the canal up to a size 4  We then performed trial reduction with a size 4 femur size 52 acetabulum and a size 36 head with 1.5 followed by 5.0 neck length.  Leg length was confirmed first followed by shuck test followed by flexion internal rotation test followed by external rotation extension test followed by sleep test.  Once I was satisfied with the reduction and stability the trial components were removed. Drill holes were passed through the greater trochanter and #5 Ethibond suture was passed through the drill holes. The implant was placed. The size Blank head was placed and the hip was reduced  The hip was taken back through the same test as performed during the trial and I was satisfied with the reduction and stability  The wound was irrigated thoroughly. The abductors were repaired including the vastus lateralis with # 5 Ethibond. The hip was then abducted and the fascia was closed with #1 Bralon Skin was closed with 0 Monocryl suture.  Skin staples were used to reapproximate the skin edges  Exparel dilute 20cc was used along with Marcaine with epinephrine   PROCEDURE:  Procedure(s): TOTAL HIP ARTHROPLASTY (Left)  SURGEON:  Surgeon(s) and Role:    * Vickki HearingStanley E Harrison, MD - Primary  PHYSICIAN ASSISTANT:   ASSISTANTS: betty ashley and debbie dallas   ANESTHESIA:   spinal  EBL:  Total I/O In: 1300  [I.V.:1300] Out: 400 [Urine:150; Blood:250]  BLOOD ADMINISTERED:none  DRAINS: none   LOCAL MEDICATIONS USED:  MARCAINE   , Amount: 30cc with epi  ml and OTHER exparel 20 diluted with 40 cc saline   SPECIMEN:  No Specimen  DISPOSITION OF SPECIMEN:  N/A  COUNTS:  YES  TOURNIQUET:  * No tourniquets in log *  DICTATION: .Dragon Dictation  PLAN OF CARE: Admit to inpatient   PATIENT DISPOSITION:  PACU - hemodynamically stable.   Delay start of Pharmacological VTE agent (>24hrs) due to surgical blood loss or risk of bleeding: yes

## 2016-08-17 NOTE — Anesthesia Postprocedure Evaluation (Signed)
Anesthesia Post Note  Patient: Cheryl RobertsSylvia P Griffith  Procedure(s) Performed: Procedure(s) (LRB): TOTAL HIP ARTHROPLASTY (Left)  Patient location during evaluation: PACU Anesthesia Type: Spinal Level of consciousness: awake, awake and alert, oriented and patient cooperative Pain management: pain level controlled Vital Signs Assessment: post-procedure vital signs reviewed and stable Respiratory status: spontaneous breathing, nonlabored ventilation, respiratory function stable and patient connected to nasal cannula oxygen Cardiovascular status: stable Postop Assessment: no headache, no backache, spinal receding, patient able to bend at knees and no signs of nausea or vomiting Anesthetic complications: no    Last Vitals:  Vitals:   08/17/16 1030 08/17/16 1045  BP: 135/61 (!) 126/52  Pulse: 80 80  Resp: 14 16  Temp:      Last Pain:  Vitals:   08/17/16 0634  TempSrc: Oral  PainSc: 2                  Dyneisha Murchison L

## 2016-08-17 NOTE — Evaluation (Signed)
Physical Therapy Evaluation Patient Details Name: Cheryl RobertsSylvia P Griffith MRN: [id number] DOB: 01-20-1933 Today's Date: 08/17/2016   History of Present Illness  82 yo F admitted for elective L posterior THA  Clinical Impression  Pt received in bed, and was agreeable to PT evaluation, although she was surprised that she would be having PT the day of surgery.  Pt expressed that she was independent with ambulation, ADL's, and IADL's PTA.  Although she lives alone, she has planned to have her sister stay with her for 10 days, and then her dtr will be with her, therefore, she will have 24/7 supervision/assistance at home.  During PT evaluation, she was able to transfer bed<>chair with RW, however, she required Mod A due to becoming dizzy.  The greatest obstacle for her to get home will be to negotiate her 5 entry steps.  At this point, she is recommended for HHPT, and 24/7 supervision/assistance.      Follow Up Recommendations Home health PT;Supervision/Assistance - 24 hour    Equipment Recommendations  None recommended by PT (Pt has equipment from last surgery ~1 year ago. )    Recommendations for Other Services       Precautions / Restrictions Precautions Precautions: Posterior Hip Restrictions Weight Bearing Restrictions: No      Mobility  Bed Mobility Overal bed mobility: Needs Assistance Bed Mobility: Supine to Sit     Supine to sit: Min assist;HOB elevated (increased time, and vc's to walk LE's over to the EOB. )        Transfers Overall transfer level: Needs assistance Equipment used: Rolling walker (2 wheeled) Transfers: Sit to/from [employer] CorporationStand;Stand Pivot Transfers Sit to Stand: Min assist Stand pivot transfers: Mod assist (Pt became dizzy during tansfer, and assisted back and down into the chair.  )          Ambulation/Gait Ambulation/Gait assistance:  (NA due to dizziness)              Stairs            Wheelchair Mobility    Modified Rankin (Stroke  Patients Only)       Balance Overall balance assessment: Needs assistance Sitting-balance support: Bilateral upper extremity supported;Feet supported Sitting balance-Leahy Scale: Good     Standing balance support: Bilateral upper extremity supported Standing balance-Leahy Scale: Poor                               Pertinent Vitals/Pain Pain Assessment: 0-10 Pain Score: 3  Pain Location: hip Pain Descriptors / Indicators: Aching Pain Intervention(s): Limited activity within patient's tolerance;Monitored during session;Premedicated before session;Repositioned    Home Living Family/patient expects to be discharged to:: Private residence Living Arrangements: Alone Available Help at Discharge:  (Sister will be staying with the pt for 10 days, and then her dtr's will be with her. ) Type of Home: House Home Access: Stairs to enter   Entergy CorporationEntrance Stairs-Number of Steps: 5 steps with HR.  Home Layout: One level Home Equipment: Tub bench;Bedside commode;Walker - 2 wheels;Cane - single point      Prior Function     Gait / Transfers Assistance Needed: independent- occasionally using a cane.   ADL's / Homemaking Assistance Needed: independent, still driving, and community ambulator.          Hand Dominance   Dominant Hand: Right    Extremity/Trunk Assessment   Upper Extremity Assessment: Generalized weakness  Lower Extremity Assessment: Generalized weakness         Communication   Communication: No difficulties  Cognition Arousal/Alertness: Awake/alert Behavior During Therapy: WFL for tasks assessed/performed Overall Cognitive Status: Within Functional Limits for tasks assessed                      General Comments      Exercises Total Joint Exercises Ankle Circles/Pumps: AROM;Both;5 reps;Supine Quad Sets: Strengthening;Both;5 reps;Supine Gluteal Sets: Strengthening;Both;5 reps;Supine   Assessment/Plan    PT Assessment Patient  needs continued PT services  PT Problem List Decreased strength;Decreased activity tolerance;Decreased balance;Decreased mobility;Decreased knowledge of use of DME;Decreased safety awareness;Decreased knowledge of precautions;Pain          PT Treatment Interventions DME instruction;Gait training;Stair training;Functional mobility training;Therapeutic activities;Therapeutic exercise;Balance training;Patient/family education    PT Goals (Current goals can be found in the Care Plan section)  Acute Rehab PT Goals Patient Stated Goal: To go home PT Goal Formulation: With patient Time For Goal Achievement: 08/24/16 Potential to Achieve Goals: Fair    Frequency BID   Barriers to discharge        Co-evaluation               End of Session Equipment Utilized During Treatment: Gait belt Activity Tolerance: Patient tolerated treatment well Patient left: in chair;with call bell/phone within reach Nurse Communication: Mobility status (Lauren, RN notified of pt's location and dizzines with transfer. )    Functional Assessment Tool Used: [employer] AM-PAC "6-clicks"  Functional Limitation: Mobility: Walking and moving around Mobility: Walking and Moving Around Current Status [phone](G8978): At least 40 percent but less than 60 percent impaired, limited or restricted Mobility: Walking and Moving Around Goal Status [phone](G8979): At least 20 percent but less than 40 percent impaired, limited or restricted    Time: [id number] PT Time Calculation (min) (ACUTE ONLY): 25 min   Charges:   PT Evaluation $PT Eval Low Complexity: 1 Procedure PT Treatments $Therapeutic Activity: 8-22 mins   PT G Codes:   PT G-Codes **NOT FOR INPATIENT CLASS** Functional Assessment Tool Used: [employer] AM-PAC "6-clicks"  Functional Limitation: Mobility: Walking and moving around Mobility: Walking and Moving Around Current Status [phone](G8978): At least 40 percent but less than 60 percent impaired, limited or  restricted Mobility: Walking and Moving Around Goal Status [phone](G8979): At least 20 percent but less than 40 percent impaired, limited or restricted    Beth Calianne Larue, PT, DPT X: [phone]

## 2016-08-17 NOTE — Transfer of Care (Signed)
Immediate Anesthesia Transfer of Care Note  Patient: Cheryl RobertsSylvia P Griffith  Procedure(s) Performed: Procedure(s): TOTAL HIP ARTHROPLASTY (Left)  Patient Location: PACU  Anesthesia Type:Spinal  Level of Consciousness: awake and alert   Airway & Oxygen Therapy: Patient Spontanous Breathing and Patient connected to nasal cannula oxygen  Post-op Assessment: Report given to RN  Post vital signs: Reviewed and stable  Last Vitals:  Vitals:   08/17/16 0700 08/17/16 0715  BP: (!) 176/79 (!) 176/79  Resp: (!) 23 13  Temp:      Last Pain:  Vitals:   08/17/16 0634  TempSrc: Oral  PainSc: 2       Patients Stated Pain Goal: 7 (08/17/16 16100634)  Complications: No apparent anesthesia complications

## 2016-08-17 NOTE — Op Note (Signed)
08/17/2016  10:12 AM  PATIENT:  Cheryl RobertsSylvia P Nettle  82 y.o. female  PRE-OPERATIVE DIAGNOSIS:  LEFT HIP OSTEOARTHRITIS  POST-OPERATIVE DIAGNOSIS:  LEFT HIP OSTEOARTHRITIS  Findings severe osteoarthritis of the left hip with characteristic changes in the head. The head was mouth shape and. There was pitting and cyst formation. The acetabular cartilage was irregular as was the femoral cartilage.  Implants size 4 Tri-Lock high offset Depew stem with a 52 Pinnacle revision cup with one screw area 36 mm head and highly cross-linked polyethylene  Surgical dictation for  left hip replacement  The patient was identified in the preoperative holding area. The site was marked. The chart was reviewed and updated. The patient was taken to the operating room for spinal anesthesia. A Foley catheter was inserted. The patient was placed in lateral decubitus position with the operative site up. Stulberg positioner was used to hold the patient in place and axillary roll was provided as needed.  Standard preoperative antibiotics were given per protocol using SCIP recommendations.  After the sterile prep and drape the timeout was completed. All implants were accounted for x-rays were visible and everyone agreed on the surgical site left hip    The incision was made over the greater trochanter and deepened through subcutaneous tissue. The fascia was exposed and incised in line with the skin incision. The greater trochanteric bursa was excised. Blunt dissection was carried out in line of the fibers of the gluteus medius. The gluteus medius was subperiosteally dissected from the greater trochanter in line with the vastus lateralis and included the gluteus minimus. These structures were tagged and retracted proximally. 2 Steinmann pins were placed in the pelvis as retractors.  The hip capsule was excised and the hip was exposed. The hip was dislocated anteriorly. A cutting guide was used to perform the proximal neck  cut. The head was removed.  The acetabulum was evaluated and labrum and osteophytes were resected anteriorly and posterior retractors were placed.  Reaming was initiated with a 43 mm reamer and progressively this increased up to a size 52. We then placed a trial acetabular shell 1 size down from the last reaming.  Bone graft was placed  The acetabular shell was placed and checked for security.  Screw 1 x 25 mm was inserted    We next turned our attention to the femur. A starter hole reamer was passed followed by box osteotome followed by canal finder.  We broached the canal up to a size 4  We then performed trial reduction with a size 4 femur size 52 acetabulum and a size 36 head with 1.5 followed by 5.0 neck length.  Leg length was confirmed first followed by shuck test followed by flexion internal rotation test followed by external rotation extension test followed by sleep test.  Once I was satisfied with the reduction and stability the trial components were removed. Drill holes were passed through the greater trochanter and #5 Ethibond suture was passed through the drill holes. The implant was placed. The size Blank head was placed and the hip was reduced  The hip was taken back through the same test as performed during the trial and I was satisfied with the reduction and stability  The wound was irrigated thoroughly. The abductors were repaired including the vastus lateralis with # 5 Ethibond. The hip was then abducted and the fascia was closed with #1 Bralon Skin was closed with 0 Monocryl suture.  Skin staples were used to reapproximate the skin edges  Exparel dilute 20cc was used along with Marcaine with epinephrine   PROCEDURE:  Procedure(s): TOTAL HIP ARTHROPLASTY (Left)  SURGEON:  Surgeon(s) and Role:    * Vickki HearingStanley E Tinley Rought, MD - Primary  PHYSICIAN ASSISTANT:   ASSISTANTS: betty ashley and debbie dallas   ANESTHESIA:   spinal  EBL:  Total I/O In: 1300  [I.V.:1300] Out: 400 [Urine:150; Blood:250]  BLOOD ADMINISTERED:none  DRAINS: none   LOCAL MEDICATIONS USED:  MARCAINE   , Amount: 30cc with epi  ml and OTHER exparel 20 diluted with 40 cc saline   SPECIMEN:  No Specimen  DISPOSITION OF SPECIMEN:  N/A  COUNTS:  YES  TOURNIQUET:  * No tourniquets in log *  DICTATION: .Dragon Dictation  PLAN OF CARE: Admit to inpatient   PATIENT DISPOSITION:  PACU - hemodynamically stable.   Delay start of Pharmacological VTE agent (>24hrs) due to surgical blood loss or risk of bleeding: yes   [id number]

## 2016-08-17 NOTE — Anesthesia Preprocedure Evaluation (Signed)
Anesthesia Evaluation  Patient identified by MRN, date of birth, ID band Patient awake    Reviewed: Allergy & Precautions, H&P , NPO status , Patient's Chart, lab work & pertinent test results  Airway Mallampati: II  TM Distance: >3 FB Neck ROM: full    Dental  (+) Teeth Intact   Pulmonary neg pulmonary ROS,    breath sounds clear to auscultation       Cardiovascular hypertension, On Medications  Rhythm:regular Rate:Normal     Neuro/Psych Anxiety    GI/Hepatic   Endo/Other  Hypothyroidism   Renal/GU      Musculoskeletal  (+) Arthritis ,   Abdominal   Peds  Hematology   Anesthesia Other Findings   Reproductive/Obstetrics                             Anesthesia Physical Anesthesia Plan  ASA: III  Anesthesia Plan: Spinal   Post-op Pain Management:    Induction: Intravenous  Airway Management Planned: Simple Face Mask  Additional Equipment:   Intra-op Plan:   Post-operative Plan:   Informed Consent: I have reviewed the patients History and Physical, chart, labs and discussed the procedure including the risks, benefits and alternatives for the proposed anesthesia with the patient or authorized representative who has indicated his/her understanding and acceptance.     Plan Discussed with:   Anesthesia Plan Comments:         Anesthesia Quick Evaluation

## 2016-08-17 NOTE — Interval H&P Note (Signed)
History and Physical Interval Note:  08/17/2016 7:21 AM  Cheryl RobertsSylvia P Griffith  has presented today for surgery, with the diagnosis of LEFT HIP OSTEOARTHRITIS  The various methods of treatment have been discussed with the patient and family. After consideration of risks, benefits and other options for treatment, the patient has consented to  Procedure(s): TOTAL HIP ARTHROPLASTY (Left) as a surgical intervention .  The patient's history has been reviewed, patient examined, no change in status, stable for surgery.  I have reviewed the patient's chart and labs.  Questions were answered to the patient's satisfaction.    BP (!) 176/79   Temp 98.4 F (36.9 C) (Oral)   Resp 13   SpO2 98%  Skin checked cleared for surgery   CBC Latest Ref Rng & Units 08/11/2016 06/20/2015 05/14/2015  WBC 4.0 - 10.5 K/uL 7.5 8.0 -  Hemoglobin 12.0 - 15.0 g/dL 11.4(L) 9.7(L) 9.1(L)  Hematocrit 36.0 - 46.0 % 34.7(L) 28.4(L) 25.1(L)  Platelets 150 - 400 K/uL 243 239 -      [address]

## 2016-08-17 NOTE — Anesthesia Procedure Notes (Signed)
Spinal  Patient location during procedure: OR Start time: 08/17/2016 7:49 AM Preanesthetic Checklist Completed: patient identified, site marked, surgical consent, pre-op evaluation, timeout performed, IV checked, risks and benefits discussed and monitors and equipment checked Spinal Block Patient position: left lateral decubitus Prep: Betadine Patient monitoring: heart rate, cardiac monitor, continuous pulse ox and blood pressure Approach: left paramedian Location: L3-4 Injection technique: single-shot Needle Needle type: Spinocan  Needle gauge: 22 G Needle length: 9 cm Assessment Sensory level: T8 Additional Notes  ATTEMPTS:1 TRAY GN:[phone] TRAY EXPIRATION DATE:05/01/2017

## 2016-08-18 ENCOUNTER — Encounter (HOSPITAL_COMMUNITY): Payer: Self-pay | Admitting: Orthopedic Surgery

## 2016-08-18 LAB — BASIC METABOLIC PANEL
Anion gap: 6 (ref 5–15)
BUN: 15 mg/dL (ref 6–20)
CO2: 26 mmol/L (ref 22–32)
Calcium: 8.4 mg/dL — ABNORMAL LOW (ref 8.9–10.3)
Chloride: 99 mmol/L — ABNORMAL LOW (ref 101–111)
Creatinine, Ser: 1.13 mg/dL — ABNORMAL HIGH (ref 0.44–1.00)
GFR calc Af Amer: 51 mL/min — ABNORMAL LOW (ref >60)
GFR calc non Af Amer: 44 mL/min — ABNORMAL LOW (ref >60)
Glucose, Bld: 116 mg/dL — ABNORMAL HIGH (ref 65–99)
POTASSIUM: 3.8 mmol/L (ref 3.5–5.1)
SODIUM: 131 mmol/L — AB (ref 135–145)

## 2016-08-18 LAB — CBC
HCT: 27.7 % — ABNORMAL LOW (ref 36.0–46.0)
Hemoglobin: 9.1 g/dL — ABNORMAL LOW (ref 12.0–15.0)
MCH: 27.1 pg (ref 26.0–34.0)
MCHC: 32.9 g/dL (ref 30.0–36.0)
MCV: 82.4 fL (ref 78.0–100.0)
PLATELETS: 210 10*3/uL (ref 150–400)
RBC: 3.36 MIL/uL — AB (ref 3.87–5.11)
RDW: 14.2 % (ref 11.5–15.5)
WBC: 7.2 10*3/uL (ref 4.0–10.5)

## 2016-08-18 MED ORDER — LACTATED RINGERS IV BOLUS (SEPSIS)
500.0000 mL | Freq: Once | INTRAVENOUS | Status: AC
  Administered 2016-08-18: 500 mL via INTRAVENOUS

## 2016-08-18 NOTE — Care Management Important Message (Signed)
Important Message  Patient Details  Name: Cheryl RobertsSylvia P Griffith MRN: [id number] Date of Birth: [birth year]   Medicare Important Message Given:  Yes    Malcolm MetroChildress, Quandra Fedorchak Demske, RN 08/18/2016, 11:46 AM

## 2016-08-18 NOTE — Evaluation (Signed)
Occupational Therapy Evaluation Patient Details Name: [name] MRN: [id number] DOB: 1933-03-28 Today's Date: 08/18/2016    History of Present Illness 82 yo F admitted for elective L posterior THA   Clinical Impression   Pt awake, alert, oriented x4 this am, agreeable to OT evaluation. Pt is familiar with hip precautions and maintaining precautions during ADL tasks as she had a right THA last year. Pt will have 24/7 assistance from family on discharge, has all necessary DME and AE for functional task completion. Min assist required for mobility during evaluation today. No further OT services required at this time.      Follow Up Recommendations  No OT follow up;Supervision/Assistance - 24 hour    Equipment Recommendations  None recommended by OT       Precautions / Restrictions Precautions Precautions: Posterior Hip Restrictions Weight Bearing Restrictions: No      Mobility Bed Mobility Overal bed mobility: Needs Assistance Bed Mobility: Supine to Sit     Supine to sit: Min assist;HOB elevated        Transfers Overall transfer level: Needs assistance Equipment used: Rolling walker (2 wheeled) Transfers: Sit to/from [employer]Stand;Stand Pivot Transfers Sit to Stand: Min assist Stand pivot transfers: Min assist                 ADL Overall ADL's : Needs assistance/impaired Eating/Feeding: Set up;Sitting   Grooming: Set up;Sitting                                 General ADL Comments: Pt able to describe steps for LB dressing while maintaining hip precautions. Requires set-up to mod assist during evaluation due to pain               Pertinent Vitals/Pain Pain Assessment: 0-10 Pain Score: 2  Pain Location: left hip Pain Descriptors / Indicators: Discomfort Pain Intervention(s): Limited activity within patient's tolerance;Monitored during session;Repositioned     Hand Dominance Right   Extremity/Trunk Assessment Upper Extremity  Assessment Upper Extremity Assessment: Overall WFL for tasks assessed   Lower Extremity Assessment Lower Extremity Assessment: Defer to PT evaluation       Communication Communication Communication: No difficulties   Cognition Arousal/Alertness: Awake/alert Behavior During Therapy: WFL for tasks assessed/performed Overall Cognitive Status: Within Functional Limits for tasks assessed                                Home Living Family/patient expects to be discharged to:: Private residence Living Arrangements: Alone Available Help at Discharge:  (sister for 10 days, daughters after that) Type of Home: House             Bathroom Shower/Tub: Chief Strategy OfficerTub/shower unit   Bathroom Toilet: Standard     Home Equipment: Tub bench;Bedside commode;Walker - 2 wheels;Cane - single point   Additional Comments: Pt has sock aid, reacher      Prior Functioning/Environment Level of Independence: Independent  Gait / Transfers Assistance Needed: independent- occasionally using a cane.  ADL's / Homemaking Assistance Needed: independent, still driving, and community ambulator.              OT Problem List: Impaired balance (sitting and/or standing);Pain                       End of Session Equipment Utilized During Treatment: Gait belt;Rolling walker  Activity Tolerance: Patient limited by pain Patient left: in chair;with call bell/phone within reach   Time: 0820-0837 OT Time Calculation (min): 17 min Charges:  OT General Charges $OT Visit: 1 Procedure OT Evaluation $OT Eval Low Complexity: 1 Procedure Ezra SitesLeslie Marc Leichter, OTR/L  [phone] 08/18/2016, 8:42 AM

## 2016-08-18 NOTE — Progress Notes (Signed)
Patient ID: Cheryl RobertsSylvia P Griffith, female   DOB: July 02, 1933, 82 y.o.   MRN: [id number] POD 1  L THA   BP (!) 144/64 (BP Location: Left Arm)   Pulse 88   Temp 97.4 F (36.3 C) (Oral)   Resp 18   Ht 5\' 7"  (1.702 m)   Wt 161 lb (73 kg)   SpO2 97%   BMI 25.22 kg/m   Physical Exam  Constitutional: She is oriented to person, place, and time.  Musculoskeletal: She exhibits tenderness. She exhibits no edema or deformity.  MILD TENDERNESS LEFT HIP SCIATIC NERVE FUNCTIONING NORMALLY INCISION SCANT DRAINAGE   Neurological: She is alert and oriented to person, place, and time.  Psychiatric: She has a normal mood and affect. Thought content normal.   CBC Latest Ref Rng & Units 08/18/2016 08/11/2016 06/20/2015  WBC 4.0 - 10.5 K/uL 7.2 7.5 8.0  Hemoglobin 12.0 - 15.0 g/dL 4.0(J9.1(L) 11.4(L) 9.7(L)  Hematocrit 36.0 - 46.0 % 27.7(L) 34.7(L) 28.4(L)  Platelets 150 - 400 K/uL 210 243 239   S/P LT THA   REMOVE IV   CONTINUE PT   CHANGE DRESSING

## 2016-08-18 NOTE — Progress Notes (Signed)
Physical Therapy Treatment Patient Details Name: Cheryl RobertsSylvia P Griffith MRN: [id number] DOB: [birth year] Today's Date: 08/18/2016    History of Present Illness  Pt is an 82 yo female who has had a Lt THR.  She lives alone but will have her sister with her at discharge for ten  Days and then her daughter will be with her.     PT Comments    Pt progressed well this treatment with only mod I for ambulation, however pt needs assist to come from Sit to stand and sit to supine.  Follow Up Recommendations  Home health PT     Equipment Recommendations    none   Recommendations for Other Services  OT     Precautions / Restrictions Precautions Precautions: Posterior Hip Restrictions Weight Bearing Restrictions: No    Mobility  Bed Mobility Overal bed mobility: Needs Assistance Bed Mobility: Sit to Supine     Supine to sit: Mod assist        Transfers Overall transfer level: Needs assistance Equipment used: Rolling walker (2 wheeled) Transfers: Sit to/from Stand Sit to Stand: Mod assist Stand pivot transfers: Modified independent (Device/Increase time)       General transfer comment: sit to stand multiple times to improve I   Ambulation/Gait Ambulation/Gait assistance: Modified independent (Device/Increase time) Ambulation Distance (Feet): 40 Feet Assistive device: Rolling walker (2 wheeled) Gait Pattern/deviations: Step-to pattern;Trunk flexed;Decreased step length - right;Decreased step length - left   Gait velocity interpretation: Below normal speed for age/gender     Stairs            Wheelchair Mobility    Modified Rankin (Stroke Patients Only)       Balance                                    Cognition Arousal/Alertness: Awake/alert                          Exercises General Exercises - Lower Extremity Ankle Circles/Pumps: AROM;Both;10 reps Quad Sets: Both;10 reps Gluteal Sets: Both;10 reps Heel Slides: AROM;10  reps    General Comments        Pertinent Vitals/Pain Pain Score: 4  Pain Descriptors / Indicators: Aching Pain Intervention(s): Limited activity within patient's tolerance    Home Living                      Prior Function            PT Goals (current goals can now be found in the care plan section) Acute Rehab PT Goals Potential to Achieve Goals: Good Progress towards PT goals: Progressing toward goals    Frequency    BID      PT Plan Current plan remains appropriate    Co-evaluation             End of Session Equipment Utilized During Treatment: Gait belt Activity Tolerance: Patient tolerated treatment well Patient left: in bed;with call bell/phone within reach     Time: 1130-1205 PT Time Calculation (min) (ACUTE ONLY): 35 min  Charges:  $Gait Training: 8-22 mins $Therapeutic Exercise: 8-22 mins                    G CodesVirgina Griffith:      Cynthia Russell, PT CLT [phone] 08/18/2016, 12:08 PM

## 2016-08-18 NOTE — Progress Notes (Signed)
Physical Therapy Treatment Patient Details Name: Cheryl RobertsSylvia P Cieslewicz MRN: [id number] DOB: May 19, 1933 Today's Date: 08/18/2016    History of Present Illness 82 yo F admitted for elective L posterior THA    PT Comments    Pt with improved bed mobility and gait this evening.  Continues to need min assist to come sit to stand.   Follow Up Recommendations  Home health PT     Equipment Recommendations  None recommended by PT    Recommendations for Other Services  OT     Precautions / Restrictions Precautions Precautions: Posterior Hip Restrictions Weight Bearing Restrictions: No    Mobility  Bed Mobility Overal bed mobility: Needs Assistance Bed Mobility: Supine to Sit;Sit to Supine     Supine to sit: Min assist Sit to supine: Min assist      Transfers Overall transfer level: Needs assistance Equipment used: Rolling walker (2 wheeled) Transfers: Sit to/from Stand Sit to Stand: Min assist Stand pivot transfers: Modified independent (Device/Increase time)       General transfer comment: sit to stand multiple times to improve I   Ambulation/Gait Ambulation/Gait assistance: Modified independent (Device/Increase time) Ambulation Distance (Feet): 100 Feet Assistive device: Rolling walker (2 wheeled) Gait Pattern/deviations: Decreased step length - right;Decreased step length - left;Trunk flexed   Gait velocity interpretation: <1.8 ft/sec, indicative of risk for recurrent falls     Stairs            Wheelchair Mobility    Modified Rankin (Stroke Patients Only)       Balance                                    Cognition Arousal/Alertness: Awake/alert Behavior During Therapy: WFL for tasks assessed/performed         Memory: Decreased recall of precautions              Exercises Total Joint Exercises Ankle Circles/Pumps: Both;10 reps Quad Sets: Both;10 reps Gluteal Sets: Both;10 reps Heel Slides: Left;10 reps Long Arc  Quad: 10 reps;Left General Exercises - Lower Extremity Ankle Circles/Pumps: AROM;Both;10 reps Quad Sets: Both;10 reps Gluteal Sets: Both;10 reps Heel Slides: AROM;10 reps    General Comments        Pertinent Vitals/Pain Pain Score: 2  Pain Descriptors / Indicators: Aching Pain Intervention(s): Limited activity within patient's tolerance    Home Living                      Prior Function            PT Goals (current goals can now be found in the care plan section) Acute Rehab PT Goals Potential to Achieve Goals: Good Progress towards PT goals: Progressing toward goals    Frequency    BID      PT Plan Current plan remains appropriate    Co-evaluation             End of Session Equipment Utilized During Treatment: Gait belt Activity Tolerance: Patient tolerated treatment well Patient left: in bed     Time: 1311-1347 PT Time Calculation (min) (ACUTE ONLY): 36 min  Charges:  $Gait Training: 8-22 mins $Therapeutic Exercise: 8-22 mins                    G CodesVirgina Organ:      Cynthia Russell, PT CLT [phone] 08/18/2016, 1:47 PM

## 2016-08-18 NOTE — Clinical Social Work Note (Signed)
CSW received consult for possible SNF. PT has evaluated pt and recommend home health. CSW will sign off, but can be reconsulted if needed.  Raphaela Cannaday, LCSW [phone] 

## 2016-08-18 NOTE — Care Management Note (Signed)
Case Management Note  Patient Details  Name: Cheryl RobertsSylvia P Griffith MRN: [id number] Date of Birth: 04/20/1933  Subjective/Objective:                  Pt is from home, lives with husband and is ind at baseline. Pt had similar surgery last year and knows what to anticipate. She has all necessary DME. PT has recommended HH PT. [facility]. She is aware they will make every effort to come for PT the day after DC but have 48hrs to make their first visit. Concha Py[facility] Lothain[facility] is aware of [facility] and will obtain pt info from chart.   Action/Plan: Anticipate DC home over weekend. RN will notify [facility] of DC date.   Expected Discharge Date:     08/20/2016             Expected Discharge Plan:  Home w Home Health Services  In-House Referral:  NA  Discharge planning Services  CM Consult  Post Acute Care Choice:  Home Health Choice offered to:  Patient  [facility]  [facility]  Status of Service:  Completed, signed off  Malcolm MetroChildress, [name], RN 08/18/2016, 11:41 AM

## 2016-08-18 NOTE — Anesthesia Postprocedure Evaluation (Signed)
Anesthesia Post Note  Patient: Heather RobertsSylvia P Niemeier  Procedure(s) Performed: Procedure(s) (LRB): TOTAL HIP ARTHROPLASTY (Left)  Patient location during evaluation: Nursing Unit Anesthesia Type: Spinal Level of consciousness: awake and alert and oriented Pain management: pain level controlled Vital Signs Assessment: post-procedure vital signs reviewed and stable Respiratory status: spontaneous breathing, nonlabored ventilation and respiratory function stable Cardiovascular status: blood pressure returned to baseline Postop Assessment: no signs of nausea or vomiting and adequate PO intake Anesthetic complications: no    Last Vitals:  Vitals:   08/18/16 0222 08/18/16 0620  BP: (!) 138/58 (!) 144/64  Pulse: 97 88  Resp: 18 18  Temp: 36.6 C 36.3 C    Last Pain:  Vitals:   08/18/16 0620  TempSrc: Oral  PainSc:                  Aanvi Voyles J

## 2016-08-18 NOTE — Addendum Note (Signed)
Addendum  created 08/18/16 0740 by Despina Hiddenobert J Agostino Gorin, CRNA   Sign clinical note

## 2016-08-19 LAB — CBC
HEMATOCRIT: 26.5 % — AB (ref 36.0–46.0)
HEMOGLOBIN: 8.8 g/dL — AB (ref 12.0–15.0)
MCH: 27.2 pg (ref 26.0–34.0)
MCHC: 33.2 g/dL (ref 30.0–36.0)
MCV: 82 fL (ref 78.0–100.0)
Platelets: 240 10*3/uL (ref 150–400)
RBC: 3.23 MIL/uL — ABNORMAL LOW (ref 3.87–5.11)
RDW: 14.1 % (ref 11.5–15.5)
WBC: 13.4 10*3/uL — ABNORMAL HIGH (ref 4.0–10.5)

## 2016-08-19 MED ORDER — POLYETHYLENE GLYCOL 3350 17 G PO PACK: 17.0000 g | PACK | Freq: Every day | ORAL | 0 refills | Status: DC

## 2016-08-19 MED ORDER — METHOCARBAMOL 500 MG PO TABS: 500.0000 mg | ORAL_TABLET | Freq: Four times a day (QID) | ORAL | 2 refills | Status: DC | PRN

## 2016-08-19 MED ORDER — HYDROCODONE-ACETAMINOPHEN 5-325 MG PO TABS: 1.0000 | ORAL_TABLET | ORAL | 0 refills | Status: DC | PRN

## 2016-08-19 MED ORDER — ASPIRIN 325 MG PO TBEC: 325.0000 mg | DELAYED_RELEASE_TABLET | Freq: Two times a day (BID) | ORAL | 0 refills | Status: DC

## 2016-08-19 NOTE — Discharge Summary (Signed)
Physician Discharge Summary  Patient ID: Cheryl RobertsSylvia P Griffith MRN: [id number] DOB/AGE: [birth year] 82 y.o.  Admit date: 08/17/2016 Discharge date: 08/19/2016  Admission Diagnoses: left hip ostearthritis  Discharge Diagnoses: left hip osteoarthritis Active Problems:   Osteoarthritis of left hip   Discharged Condition: good  Hospital Course:  Admit 11-16 left tha depuy pressfit 4 stem 52 cup 39 x 5 head HXLPE SPINAL ROUTINE  11-17 PT TOLERATED WELL  11-18 AFEBRILE CLEAN WOUND SOFT CALF AA O X 3    Discharge Exam: Blood pressure 130/61, pulse 77, temperature 98.2 F (36.8 C), temperature source Oral, resp. rate 18, height 5\' 7"  (1.702 m), weight 161 lb (73 kg), SpO2 98 %.  Ambulation/Gait Ambulation/Gait assistance: Modified independent (Device/Increase time) Ambulation Distance (Feet): 100 Feet Assistive device: Rolling walker (2 wheeled) Gait Pattern/deviations: Decreased step length - right;Decreased step length - left;Trunk flexed   Gait velocity interpretation: <1.8 ft/sec, indicative of risk for recurrent falls     Disposition: 01-Home or Self Care  Discharge Instructions    Call MD / Call 911    Complete by:  As directed    If you experience chest pain or shortness of breath, CALL 911 and be transported to the hospital emergency room.  If you develope a fever above 101 F, pus (white drainage) or increased drainage or redness at the wound, or calf pain, call your surgeon's office.   Change dressing    Complete by:  As directed    Do not change dressing   Ok to bathe or shower   Constipation Prevention    Complete by:  As directed    Drink plenty of fluids.  Prune juice may be helpful.  You may use a stool softener, such as Colace (over the counter) 100 mg twice a day.  Use MiraLax (over the counter) for constipation as needed.   Diet - low sodium heart healthy    Complete by:  As directed    Follow the hip precautions as taught in Physical Therapy    Complete  by:  As directed    Direct lateral hip precautions   Increase activity slowly as tolerated    Complete by:  As directed    TED hose    Complete by:  As directed    Use stockings (TED hose) for 2 weeks both legs       Medication List    TAKE these medications   acetaminophen 500 MG tablet Commonly known as:  TYLENOL Take 1,000 mg by mouth every 6 (six) hours as needed for mild pain.   aspirin 325 MG EC tablet Take 1 tablet (325 mg total) by mouth 2 (two) times daily.   HYDROcodone-acetaminophen 5-325 MG tablet Commonly known as:  NORCO/VICODIN Take 1 tablet by mouth every 4 (four) hours as needed for moderate pain.   levothyroxine 112 MCG tablet Commonly known as:  SYNTHROID, LEVOTHROID Take 112 mcg by mouth daily before breakfast.   lisinopril-hydrochlorothiazide 10-12.5 MG tablet Commonly known as:  PRINZIDE,ZESTORETIC Take 1 tablet by mouth daily.   methocarbamol 500 MG tablet Commonly known as:  ROBAXIN Take 1 tablet (500 mg total) by mouth every 6 (six) hours as needed for muscle spasms.   OVER THE COUNTER MEDICATION Eye drops prn burning eyes   polyethylene glycol packet Commonly known as:  MIRALAX / GLYCOLAX Take 17 g by mouth daily. Start taking on:  08/20/2016   traZODone 50 MG tablet Commonly known as:  DESYREL Take 100 mg by mouth  at bedtime.            Durable Medical Equipment        Start     Ordered   08/17/16 1049  DME Walker rolling home use  Once    Question:  Patient needs a walker to treat with the following condition  Answer:  History of total left hip arthroplasty   08/17/16 1048   08/17/16 1049  DME 3 n 1  Once    Comments:  Home use   08/17/16 1048   08/17/16 1049  DME Bedside commode  Once    Comments:  Home use  Question:  Patient needs a bedside commode to treat with the following condition  Answer:  History of left hip replacement   08/17/16 1048     Follow-up Information    [facility].    Contact information: [address] [place] [id number] [phone]           Signed: Fuller CanadaStanley Laqueena Hinchey 08/19/2016, 9:57 AM

## 2016-08-19 NOTE — Progress Notes (Signed)
Patient states understanding of discharge instructions, prescriptions given. Home health called and informed of patient's discharge

## 2016-08-19 NOTE — Progress Notes (Signed)
PT Cancellation Note  Patient Details Name: Cheryl RobertsSylvia P Griffith MRN: [id number] DOB: 10/12/1932   Cancelled Treatment:     PT attempted to see pt prior to d/c this morning. She declined stating she feels she is doing great. I discussed the importance of working on stairs due to her daughters concern for her safety, but she feels that she will be able to do them just fine once she is home.   11:44 AM,08/19/16 Marylyn IshiharaSara Kiser PT, DPT [name] [phone]

## 2016-08-20 DIAGNOSIS — M1991 Primary osteoarthritis, unspecified site: Secondary | ICD-10-CM | POA: Diagnosis not present

## 2016-08-20 DIAGNOSIS — Z471 Aftercare following joint replacement surgery: Secondary | ICD-10-CM | POA: Diagnosis not present

## 2016-08-20 DIAGNOSIS — Z96642 Presence of left artificial hip joint: Secondary | ICD-10-CM | POA: Diagnosis not present

## 2016-08-20 DIAGNOSIS — Z7982 Long term (current) use of aspirin: Secondary | ICD-10-CM | POA: Diagnosis not present

## 2016-08-20 DIAGNOSIS — I10 Essential (primary) hypertension: Secondary | ICD-10-CM | POA: Diagnosis not present

## 2016-08-20 LAB — TYPE AND SCREEN
ABO/RH(D): A NEG
ANTIBODY SCREEN: POSITIVE
DAT, IGG: NEGATIVE
Unit division: 0
Unit division: 0

## 2016-08-22 DIAGNOSIS — M1991 Primary osteoarthritis, unspecified site: Secondary | ICD-10-CM | POA: Diagnosis not present

## 2016-08-22 DIAGNOSIS — I10 Essential (primary) hypertension: Secondary | ICD-10-CM | POA: Diagnosis not present

## 2016-08-22 DIAGNOSIS — Z96642 Presence of left artificial hip joint: Secondary | ICD-10-CM | POA: Diagnosis not present

## 2016-08-22 DIAGNOSIS — Z471 Aftercare following joint replacement surgery: Secondary | ICD-10-CM | POA: Diagnosis not present

## 2016-08-22 DIAGNOSIS — Z7982 Long term (current) use of aspirin: Secondary | ICD-10-CM | POA: Diagnosis not present

## 2016-08-25 DIAGNOSIS — Z7982 Long term (current) use of aspirin: Secondary | ICD-10-CM | POA: Diagnosis not present

## 2016-08-25 DIAGNOSIS — Z96642 Presence of left artificial hip joint: Secondary | ICD-10-CM | POA: Diagnosis not present

## 2016-08-25 DIAGNOSIS — I10 Essential (primary) hypertension: Secondary | ICD-10-CM | POA: Diagnosis not present

## 2016-08-25 DIAGNOSIS — M1991 Primary osteoarthritis, unspecified site: Secondary | ICD-10-CM | POA: Diagnosis not present

## 2016-08-25 DIAGNOSIS — Z471 Aftercare following joint replacement surgery: Secondary | ICD-10-CM | POA: Diagnosis not present

## 2016-08-28 ENCOUNTER — Telehealth: Payer: Self-pay | Admitting: Orthopedic Surgery

## 2016-08-28 DIAGNOSIS — M1991 Primary osteoarthritis, unspecified site: Secondary | ICD-10-CM | POA: Diagnosis not present

## 2016-08-28 DIAGNOSIS — Z471 Aftercare following joint replacement surgery: Secondary | ICD-10-CM | POA: Diagnosis not present

## 2016-08-28 DIAGNOSIS — Z96642 Presence of left artificial hip joint: Secondary | ICD-10-CM | POA: Diagnosis not present

## 2016-08-28 DIAGNOSIS — Z7982 Long term (current) use of aspirin: Secondary | ICD-10-CM | POA: Diagnosis not present

## 2016-08-28 DIAGNOSIS — I10 Essential (primary) hypertension: Secondary | ICD-10-CM | POA: Diagnosis not present

## 2016-08-28 NOTE — Telephone Encounter (Signed)
COVERED??

## 2016-08-28 NOTE — Telephone Encounter (Signed)
Patient called and confirmed her appointment for tomorrow  but she said that PT also wanted her to tell Dr. Romeo AppleHarrison that her bandage was almost 90% covered.  Her Left THA was done on 08-17-16.  Her appointment is tomorrow morning at 10:30.

## 2016-08-29 ENCOUNTER — Ambulatory Visit (INDEPENDENT_AMBULATORY_CARE_PROVIDER_SITE_OTHER): Payer: Medicare Other | Admitting: Orthopedic Surgery

## 2016-08-29 ENCOUNTER — Encounter: Payer: Self-pay | Admitting: Orthopedic Surgery

## 2016-08-29 DIAGNOSIS — Z4889 Encounter for other specified surgical aftercare: Secondary | ICD-10-CM

## 2016-08-29 DIAGNOSIS — Z96642 Presence of left artificial hip joint: Secondary | ICD-10-CM

## 2016-08-29 NOTE — Progress Notes (Signed)
Patient ID: Cheryl RobertsSylvia P Griffith, female   DOB: 09-08-1933, 82 y.o.   MRN: [id number]  Post op visit   Chief Complaint  Patient presents with  . Follow-up    POST OP 1, LEFT THA, DOS 08/17/16    Left total hip direct lateral approach postop day 12  The wound looked good and the staples were removed Steri-Strips were applied  She has no peripheral edema no calf swelling or tenderness Homans sign is negative  She's ambulatory with a walker   She can remove the TED stockings continue the aspirin for 28 days postop  Come back for postop week #6 appointment   Encounter Diagnoses  Name Primary?  . Status post left hip replacement Yes  . Aftercare following surgery

## 2016-08-29 NOTE — Patient Instructions (Signed)
STOP ASPIRIN POST OPERATIVE DAY 28  REMOVE STOCKINGS

## 2016-08-30 DIAGNOSIS — Z7982 Long term (current) use of aspirin: Secondary | ICD-10-CM | POA: Diagnosis not present

## 2016-08-30 DIAGNOSIS — Z471 Aftercare following joint replacement surgery: Secondary | ICD-10-CM | POA: Diagnosis not present

## 2016-08-30 DIAGNOSIS — M1991 Primary osteoarthritis, unspecified site: Secondary | ICD-10-CM | POA: Diagnosis not present

## 2016-08-30 DIAGNOSIS — I10 Essential (primary) hypertension: Secondary | ICD-10-CM | POA: Diagnosis not present

## 2016-08-30 DIAGNOSIS — Z96642 Presence of left artificial hip joint: Secondary | ICD-10-CM | POA: Diagnosis not present

## 2016-09-05 DIAGNOSIS — I10 Essential (primary) hypertension: Secondary | ICD-10-CM | POA: Diagnosis not present

## 2016-09-05 DIAGNOSIS — Z96642 Presence of left artificial hip joint: Secondary | ICD-10-CM | POA: Diagnosis not present

## 2016-09-05 DIAGNOSIS — M1991 Primary osteoarthritis, unspecified site: Secondary | ICD-10-CM | POA: Diagnosis not present

## 2016-09-05 DIAGNOSIS — Z7982 Long term (current) use of aspirin: Secondary | ICD-10-CM | POA: Diagnosis not present

## 2016-09-05 DIAGNOSIS — Z471 Aftercare following joint replacement surgery: Secondary | ICD-10-CM | POA: Diagnosis not present

## 2016-09-07 DIAGNOSIS — Z96642 Presence of left artificial hip joint: Secondary | ICD-10-CM | POA: Diagnosis not present

## 2016-09-07 DIAGNOSIS — I10 Essential (primary) hypertension: Secondary | ICD-10-CM | POA: Diagnosis not present

## 2016-09-07 DIAGNOSIS — Z471 Aftercare following joint replacement surgery: Secondary | ICD-10-CM | POA: Diagnosis not present

## 2016-09-07 DIAGNOSIS — Z7982 Long term (current) use of aspirin: Secondary | ICD-10-CM | POA: Diagnosis not present

## 2016-09-07 DIAGNOSIS — M1991 Primary osteoarthritis, unspecified site: Secondary | ICD-10-CM | POA: Diagnosis not present

## 2016-09-21 ENCOUNTER — Ambulatory Visit: Payer: Medicare Other | Admitting: Orthopedic Surgery

## 2016-10-03 ENCOUNTER — Ambulatory Visit (INDEPENDENT_AMBULATORY_CARE_PROVIDER_SITE_OTHER): Payer: Self-pay | Admitting: Orthopedic Surgery

## 2016-10-03 ENCOUNTER — Encounter: Payer: Self-pay | Admitting: Orthopedic Surgery

## 2016-10-03 DIAGNOSIS — Z4889 Encounter for other specified surgical aftercare: Secondary | ICD-10-CM

## 2016-10-03 DIAGNOSIS — Z96642 Presence of left artificial hip joint: Secondary | ICD-10-CM

## 2016-10-03 NOTE — Progress Notes (Signed)
Patient ID: Cheryl RobertsSylvia P Griffith, female   DOB: 1933-02-06, 83 y.o.   MRN: [id number]  Follow up visit/  left total hip  Chief Complaint  Patient presents with  . Follow-up    Left THA, DOS 09/16/16    Postoperative 47  Doing well no complaints cane right hand  Left hip flexion 125 leg lengths equal     Encounter Diagnoses  Name Primary?  Marland Kitchen. Aftercare following surgery Yes  . Status post left hip replacement    Scheduled for routine follow-up in 6 weeks There were no vitals taken for this visit.  4:07 PM Fuller CanadaStanley Harrison, MD 10/03/2016

## 2016-10-06 DIAGNOSIS — I10 Essential (primary) hypertension: Secondary | ICD-10-CM | POA: Diagnosis not present

## 2016-10-06 DIAGNOSIS — R200 Anesthesia of skin: Secondary | ICD-10-CM | POA: Diagnosis not present

## 2016-10-13 DIAGNOSIS — M79641 Pain in right hand: Secondary | ICD-10-CM | POA: Diagnosis not present

## 2016-10-13 DIAGNOSIS — M79642 Pain in left hand: Secondary | ICD-10-CM | POA: Diagnosis not present

## 2016-10-23 DIAGNOSIS — Z961 Presence of intraocular lens: Secondary | ICD-10-CM | POA: Diagnosis not present

## 2016-10-23 DIAGNOSIS — H35373 Puckering of macula, bilateral: Secondary | ICD-10-CM | POA: Diagnosis not present

## 2016-10-23 DIAGNOSIS — H524 Presbyopia: Secondary | ICD-10-CM | POA: Diagnosis not present

## 2016-10-26 ENCOUNTER — Ambulatory Visit (INDEPENDENT_AMBULATORY_CARE_PROVIDER_SITE_OTHER): Payer: Medicare Other | Admitting: Otolaryngology

## 2016-10-26 DIAGNOSIS — H903 Sensorineural hearing loss, bilateral: Secondary | ICD-10-CM | POA: Diagnosis not present

## 2016-10-26 DIAGNOSIS — H6123 Impacted cerumen, bilateral: Secondary | ICD-10-CM

## 2016-11-01 DIAGNOSIS — M79642 Pain in left hand: Secondary | ICD-10-CM | POA: Diagnosis not present

## 2016-11-01 DIAGNOSIS — G5603 Carpal tunnel syndrome, bilateral upper limbs: Secondary | ICD-10-CM | POA: Diagnosis not present

## 2016-11-01 DIAGNOSIS — M79641 Pain in right hand: Secondary | ICD-10-CM | POA: Diagnosis not present

## 2016-11-03 IMAGING — CR DG CHEST 2V
2 series · 2 of 2 positions shown · non-contrast
Comparison: Limited CT chest of 09/16/2004

CLINICAL DATA: Preop for right hip surgery, history of hypertension

EXAM:
CHEST  2 VIEW

[w chest pa]
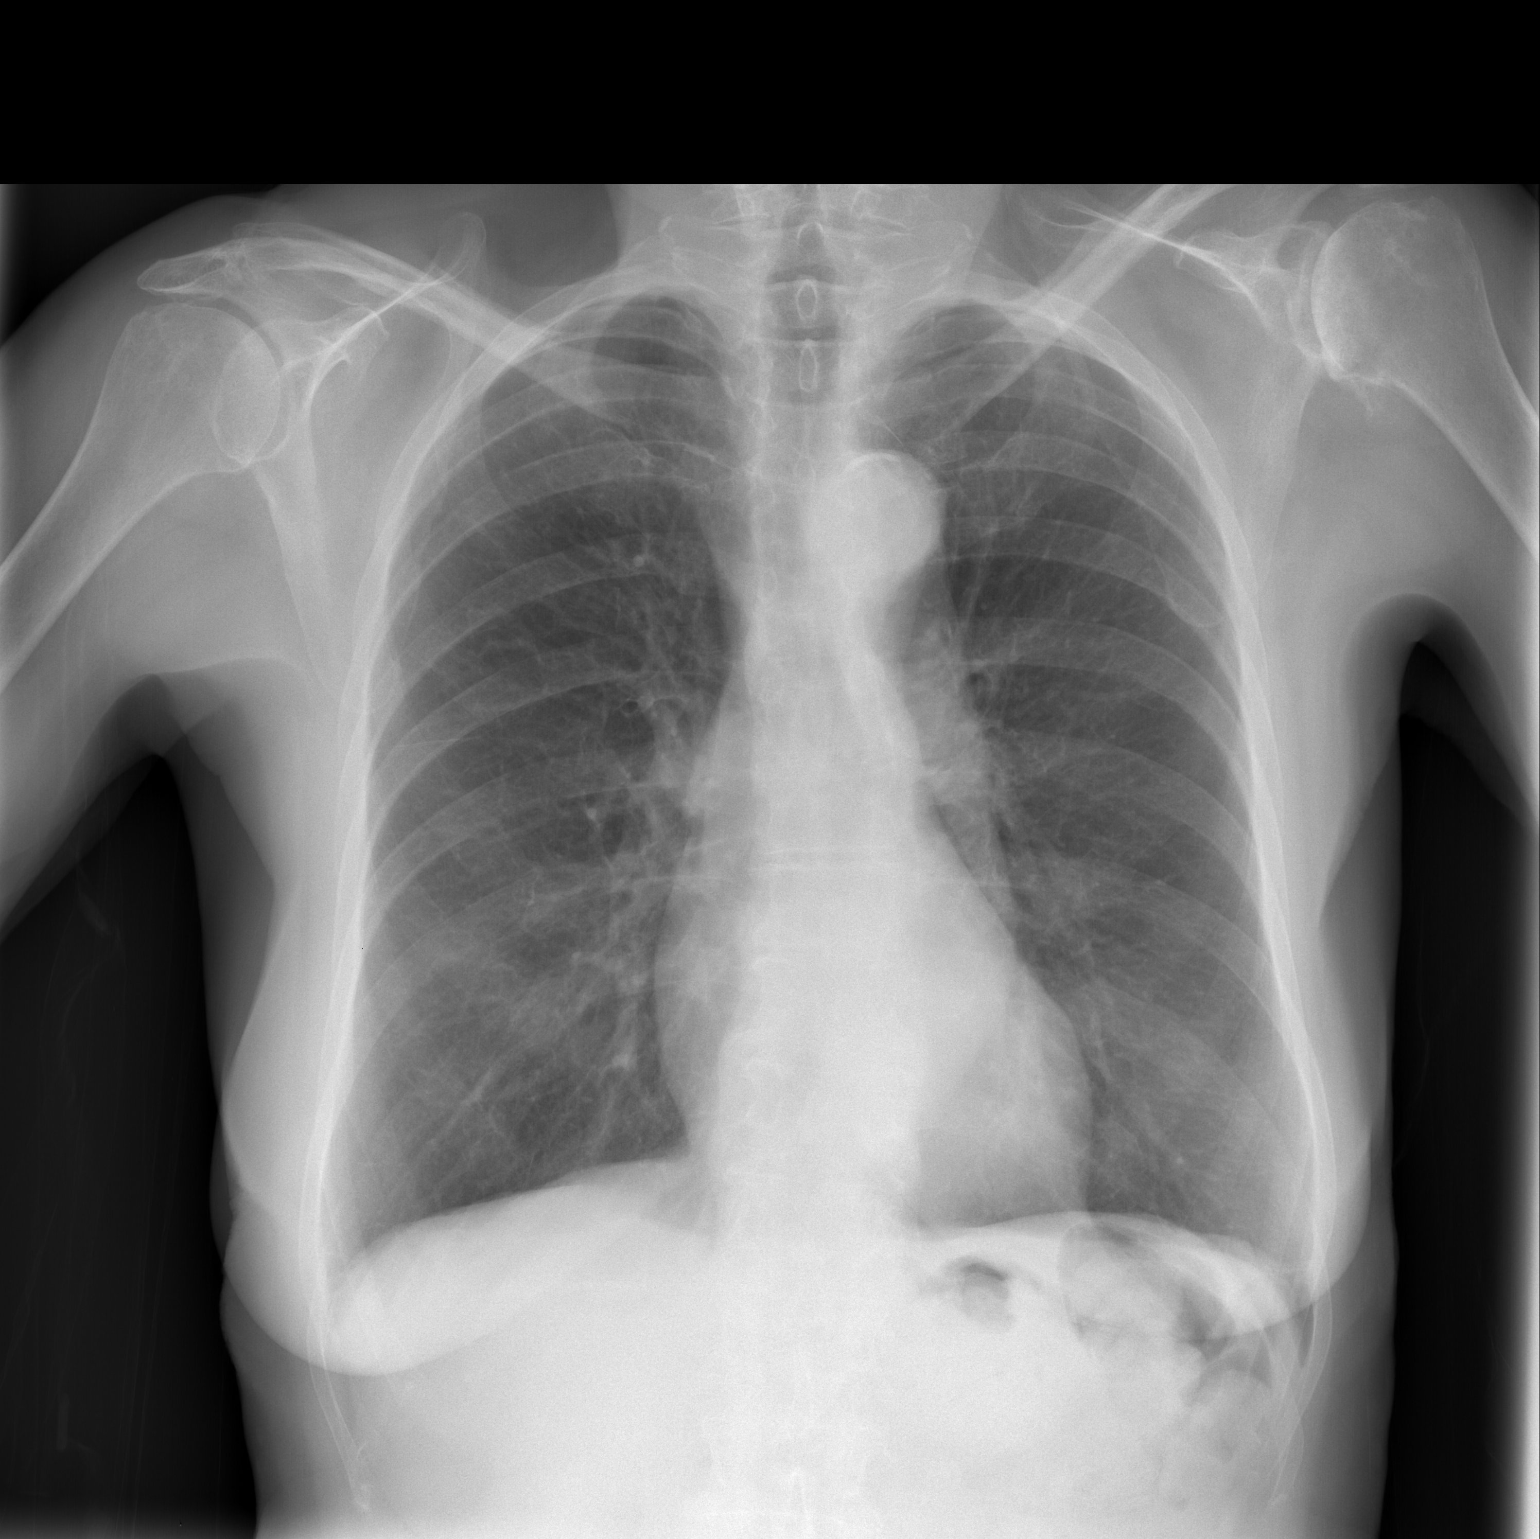

[w chest lat]
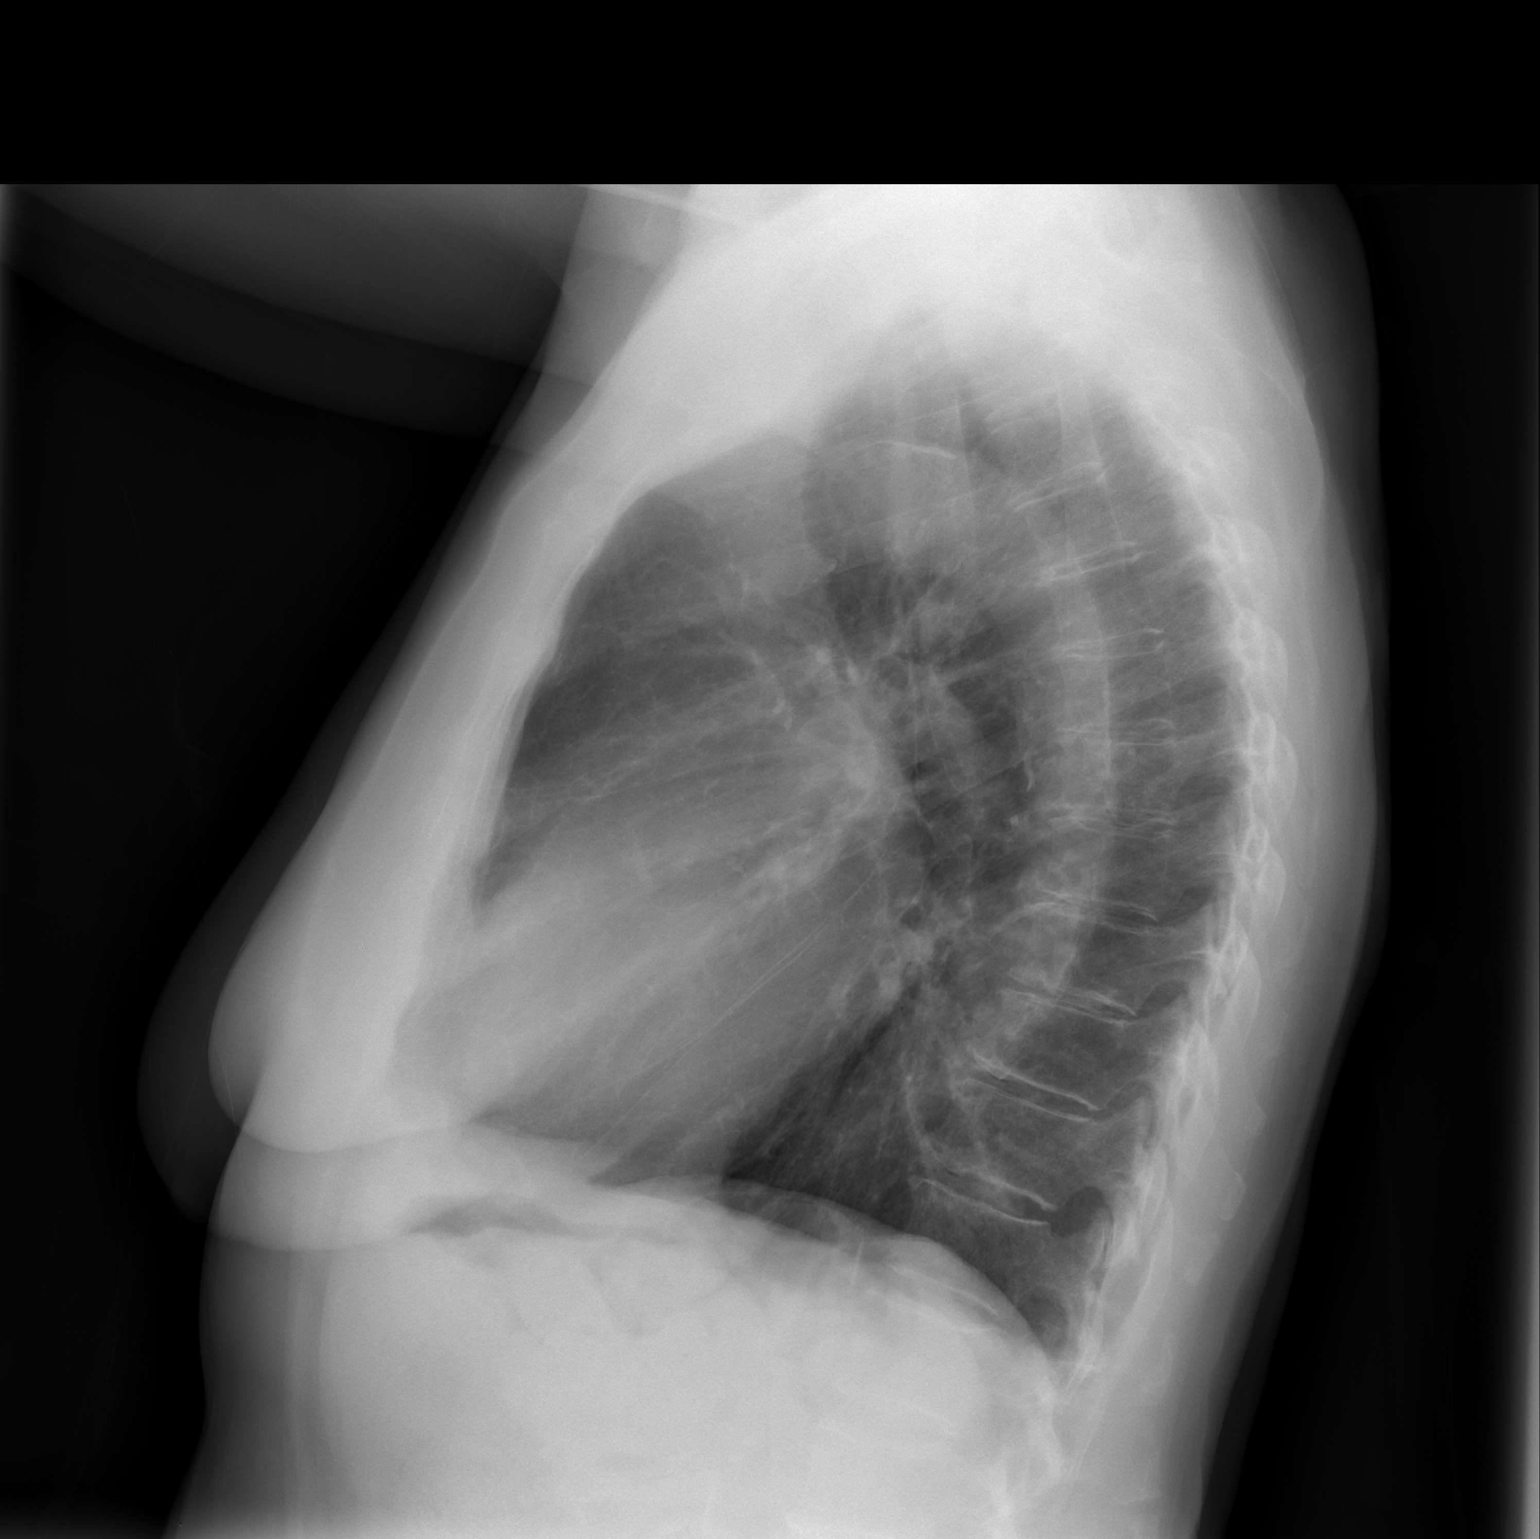

[2 of 2 positions shown; findings below may reference images not displayed]

FINDINGS: The lungs are clear and somewhat hyperaerated. With flattened
hemidiaphragms, emphysema is a definite consideration. Mediastinal
and hilar contours are unremarkable. The heart is within normal
limits in size. No bony abnormality is seen.
IMPRESSION: No active cardiopulmonary disease. Hyper aeration consistent with
emphysema.

## 2016-11-11 IMAGING — CR DG HIP (WITH OR WITHOUT PELVIS) 1V PORT*R*
1 series · 1 of 1 positions shown · non-contrast
Comparison: None.

CLINICAL DATA: Status post right hip replacement

EXAM:
DG HIP (WITH OR WITHOUT PELVIS) 1V PORT RIGHT

[AP]
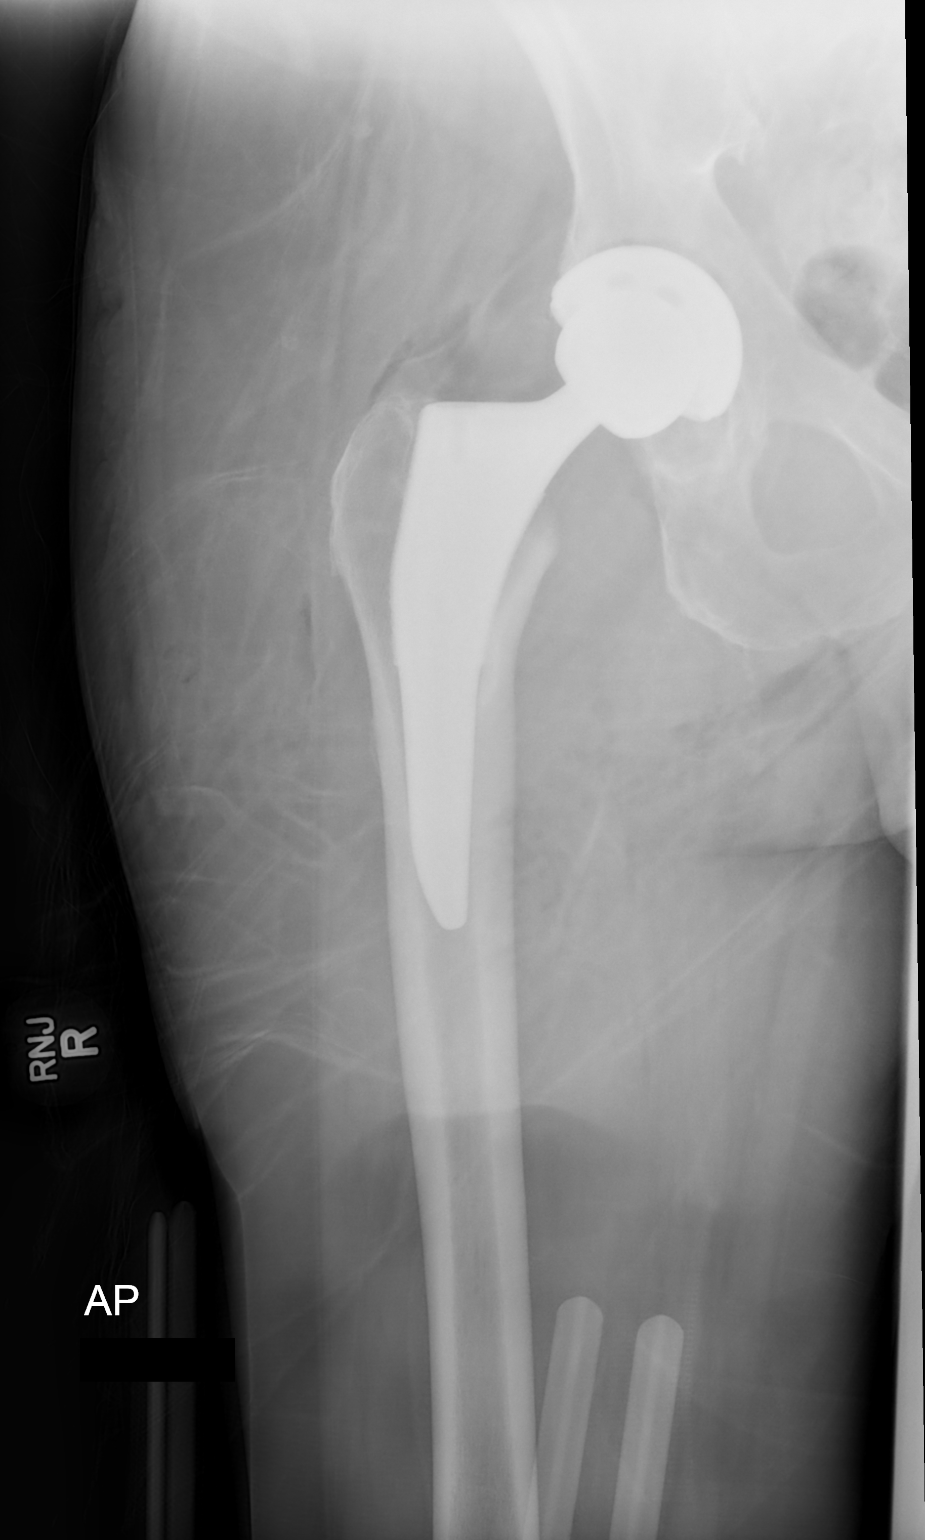

[1 of 1 positions shown; findings below may reference images not displayed]

FINDINGS: A right hip replacement is seen. No acute bony abnormality is noted.
IMPRESSION: Status post right hip replacement

## 2016-11-20 ENCOUNTER — Ambulatory Visit (INDEPENDENT_AMBULATORY_CARE_PROVIDER_SITE_OTHER): Payer: Medicare Other | Admitting: Orthopedic Surgery

## 2016-11-20 DIAGNOSIS — Z96642 Presence of left artificial hip joint: Secondary | ICD-10-CM

## 2016-11-20 DIAGNOSIS — M48062 Spinal stenosis, lumbar region with neurogenic claudication: Secondary | ICD-10-CM

## 2016-11-20 NOTE — Addendum Note (Signed)
Addended by: Adella HareBOOTHE, Ahonesty Woodfin B on: 11/20/2016 03:08 PM   Modules accepted: Orders

## 2016-11-20 NOTE — Progress Notes (Signed)
FOLLOW UP VISIT   Patient ID: Cheryl RobertsSylvia P Curnow, female   DOB: 10-08-1932, 83 y.o.   MRN: [id number]  Chief Complaint  Patient presents with  . Follow-up    Recheck on left hip, DOS 08-17-16.    HPI Cheryl RobertsSylvia P Griesinger is a 83 y.o. female.   HPI  Status post left total hip. Also had a previous right total hip. Patient is well and she starts walking such as going shopping and then has left-sided lower back pain and then has to lean forward to finish her shopping trip  Review of Systems Review of Systems  Constitutional: Negative for unexpected weight change.  Neurological: Negative for weakness and numbness.       Physical Exam  Constitutional: She is oriented to person, place, and time. She appears well-developed and well-nourished. No distress.  Cardiovascular: Normal rate and intact distal pulses.   Neurological: She is alert and oriented to person, place, and time. She has normal reflexes. She exhibits normal muscle tone. Coordination normal.  Skin: Skin is warm and dry. No rash noted. She is not diaphoretic. No erythema. No pallor.  Psychiatric: She has a normal mood and affect. Her behavior is normal. Judgment and thought content normal.    Sitting the legs are normal in length standing with various levels of blocks the legs are still level she has tried a half inch heel lift and she still doesn't feel level she still using her cane she has tenderness in her lower back negative straight leg raise is normal reflexes   MEDICAL DECISION MAKING  DATA     DIAGNOSIS  Encounter Diagnoses  Name Primary?  . Status post left hip replacement   . Spinal stenosis, lumbar region, with neurogenic claudication Yes     PLAN(RISK)    Spinal stenosis recommend MRI status post total hip stable

## 2016-11-27 ENCOUNTER — Ambulatory Visit (HOSPITAL_COMMUNITY)
Admission: RE | Admit: 2016-11-27 | Discharge: 2016-11-27 | Disposition: A | Discharge status: Home / Self Care | Payer: Medicare Other | Source: Ambulatory Visit | Attending: Orthopedic Surgery | Admitting: Orthopedic Surgery

## 2016-11-27 DIAGNOSIS — M5126 Other intervertebral disc displacement, lumbar region: Secondary | ICD-10-CM | POA: Insufficient documentation

## 2016-11-27 DIAGNOSIS — M7138 Other bursal cyst, other site: Secondary | ICD-10-CM | POA: Diagnosis not present

## 2016-11-27 DIAGNOSIS — M545 Low back pain: Secondary | ICD-10-CM | POA: Diagnosis not present

## 2016-11-27 DIAGNOSIS — M48062 Spinal stenosis, lumbar region with neurogenic claudication: Secondary | ICD-10-CM | POA: Diagnosis not present

## 2016-12-01 ENCOUNTER — Ambulatory Visit (INDEPENDENT_AMBULATORY_CARE_PROVIDER_SITE_OTHER): Payer: Medicare Other | Admitting: Orthopedic Surgery

## 2016-12-01 DIAGNOSIS — Z96642 Presence of left artificial hip joint: Secondary | ICD-10-CM

## 2016-12-01 DIAGNOSIS — Z4889 Encounter for other specified surgical aftercare: Secondary | ICD-10-CM | POA: Diagnosis not present

## 2016-12-01 DIAGNOSIS — M48062 Spinal stenosis, lumbar region with neurogenic claudication: Secondary | ICD-10-CM | POA: Diagnosis not present

## 2016-12-01 NOTE — Progress Notes (Signed)
FOLLOW UP VISIT   Patient ID: Cheryl RobertsSylvia P Griffith, female   DOB: 1933/04/16, 83 y.o.   MRN: [id number]  Chief Complaint  Patient presents with  . Follow-up    MRI results of L-spine.    HPI [name] is a 83 y.o. female.   HPI  Status post total hip in November 2017  Postoperatively patient feels she has a leg length discrepancy and left sided hip pain and leg pain. She was sent for MRI  Review of Systems Review of Systems   She continues to have leg length discrepancy feeling but no bowel or bladder dysfunction  Physical Exam   No new physical exam today  MEDICAL DECISION MAKING  DATA   L1-L2:  Normal disc space.  Mild facet arthropathy.  No impingement.   L2-L3: Normal disc space. Advanced facet arthropathy. Large synovial cysts contribute to mild canal stenosis and subarticular zone narrowing. On the RIGHT, the synovial cyst measures 4 x 9 mm. On the LEFT, the synovial cyst measures 3 x 5 mm. RIGHT L3 nerve root impingement is possible.   L3-L4: Disc space narrowing. Annular bulge. Posterior element hypertrophy. No definite impingement.   L4-L5: Disc space narrowing. Central protrusion. Osseous spurring. Posterior element hypertrophy. Mild stenosis. Asymmetric subarticular zone and foraminal zone narrowing could affect the LEFT L5 and/or LEFT L4 nerve roots.   L5-S1: Disc space narrowing. Annular bulge. Posterior element hypertrophy. No subarticular zone narrowing. LEFT foraminal narrowing could affect the L5 nerve root.   IMPRESSION: Large synovial cysts at L2-3 contribute to RIGHT greater than LEFT subarticular zone narrowing. RIGHT L3 nerve root impingement is possible.   Advanced disc space narrowing at L4-5 with central protrusion, osseous ridging, and posterior element hypertrophy could affect the LEFT L4 and/or LEFT L5 nerve roots.   Similarly, asymmetric foraminal narrowing at L5-S1 on the LEFT could contribute to LEFT L5 symptomatology.     Electronically Signed   By: Elsie StainJohn T Curnes M.D.   On: 11/27/2016 09:25  I interpreted this MRI spinal stenosis multiple levels with degenerative disc disease and L4 and L5 root impingement from hard discs  DIAGNOSIS  Encounter Diagnoses  Name Primary?  . Status post left hip replacement   . Aftercare following surgery   . Spinal stenosis, lumbar region, with neurogenic claudication Yes     PLAN(RISK)    Based on the MRI findings I recommend that she have an epidural steroid series at L4 and 5.

## 2016-12-05 ENCOUNTER — Other Ambulatory Visit: Payer: Self-pay | Admitting: Orthopedic Surgery

## 2016-12-05 DIAGNOSIS — M48062 Spinal stenosis, lumbar region with neurogenic claudication: Secondary | ICD-10-CM

## 2016-12-11 ENCOUNTER — Inpatient Hospital Stay
Admission: RE | Admit: 2016-12-11 | Discharge: 2016-12-11 | Disposition: A | Discharge status: Home / Self Care | Payer: Medicare Other | Source: Ambulatory Visit | Attending: Orthopedic Surgery | Admitting: Orthopedic Surgery

## 2016-12-11 NOTE — Discharge Instructions (Signed)

## 2016-12-20 ENCOUNTER — Inpatient Hospital Stay
Admission: RE | Admit: 2016-12-20 | Discharge: 2016-12-20 | Disposition: A | Discharge status: Home / Self Care | Payer: Medicare Other | Source: Ambulatory Visit | Attending: Orthopedic Surgery | Admitting: Orthopedic Surgery

## 2016-12-20 IMAGING — DX DG HIP (WITH OR WITHOUT PELVIS) 2-3V*R*
3 series · 3 of 3 positions shown · non-contrast
Comparison: 05/20/2015

CLINICAL DATA: Fall tonight.  History of right hip replacement.

EXAM:
DG HIP (WITH OR WITHOUT PELVIS) 2-3V RIGHT

[pelvis ap]
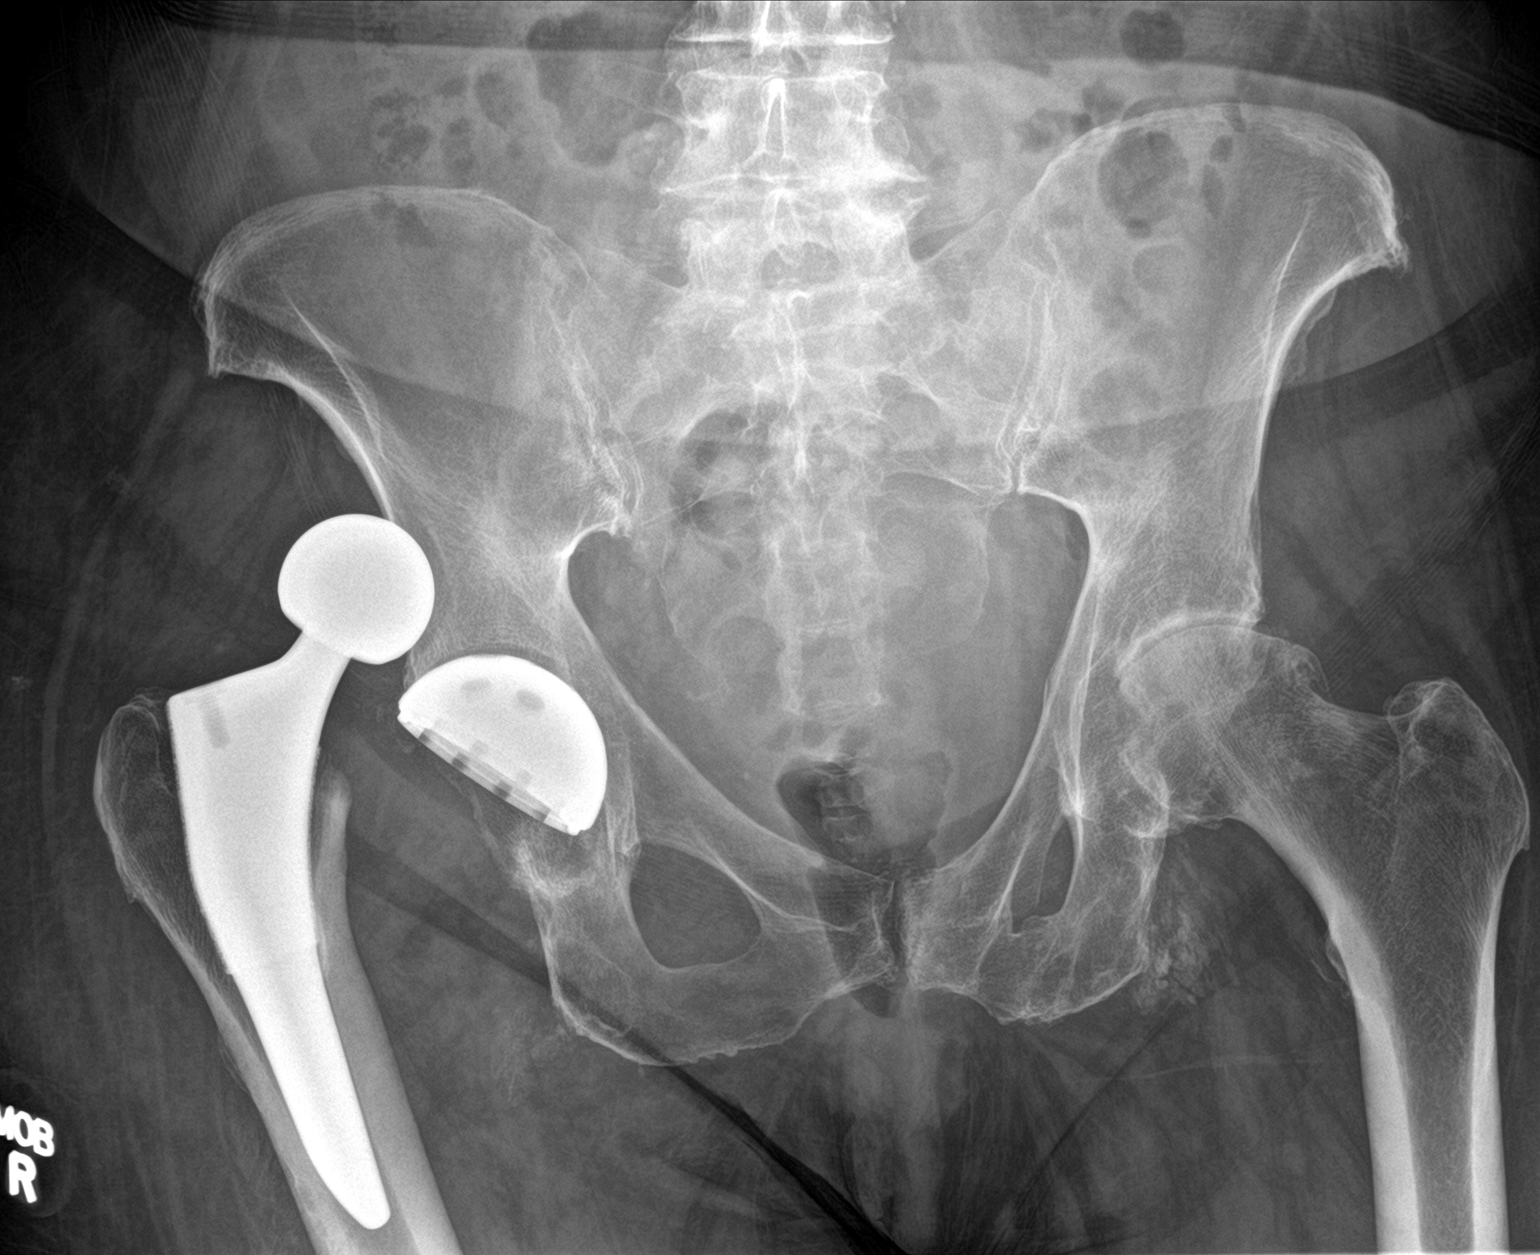

[hip ap]
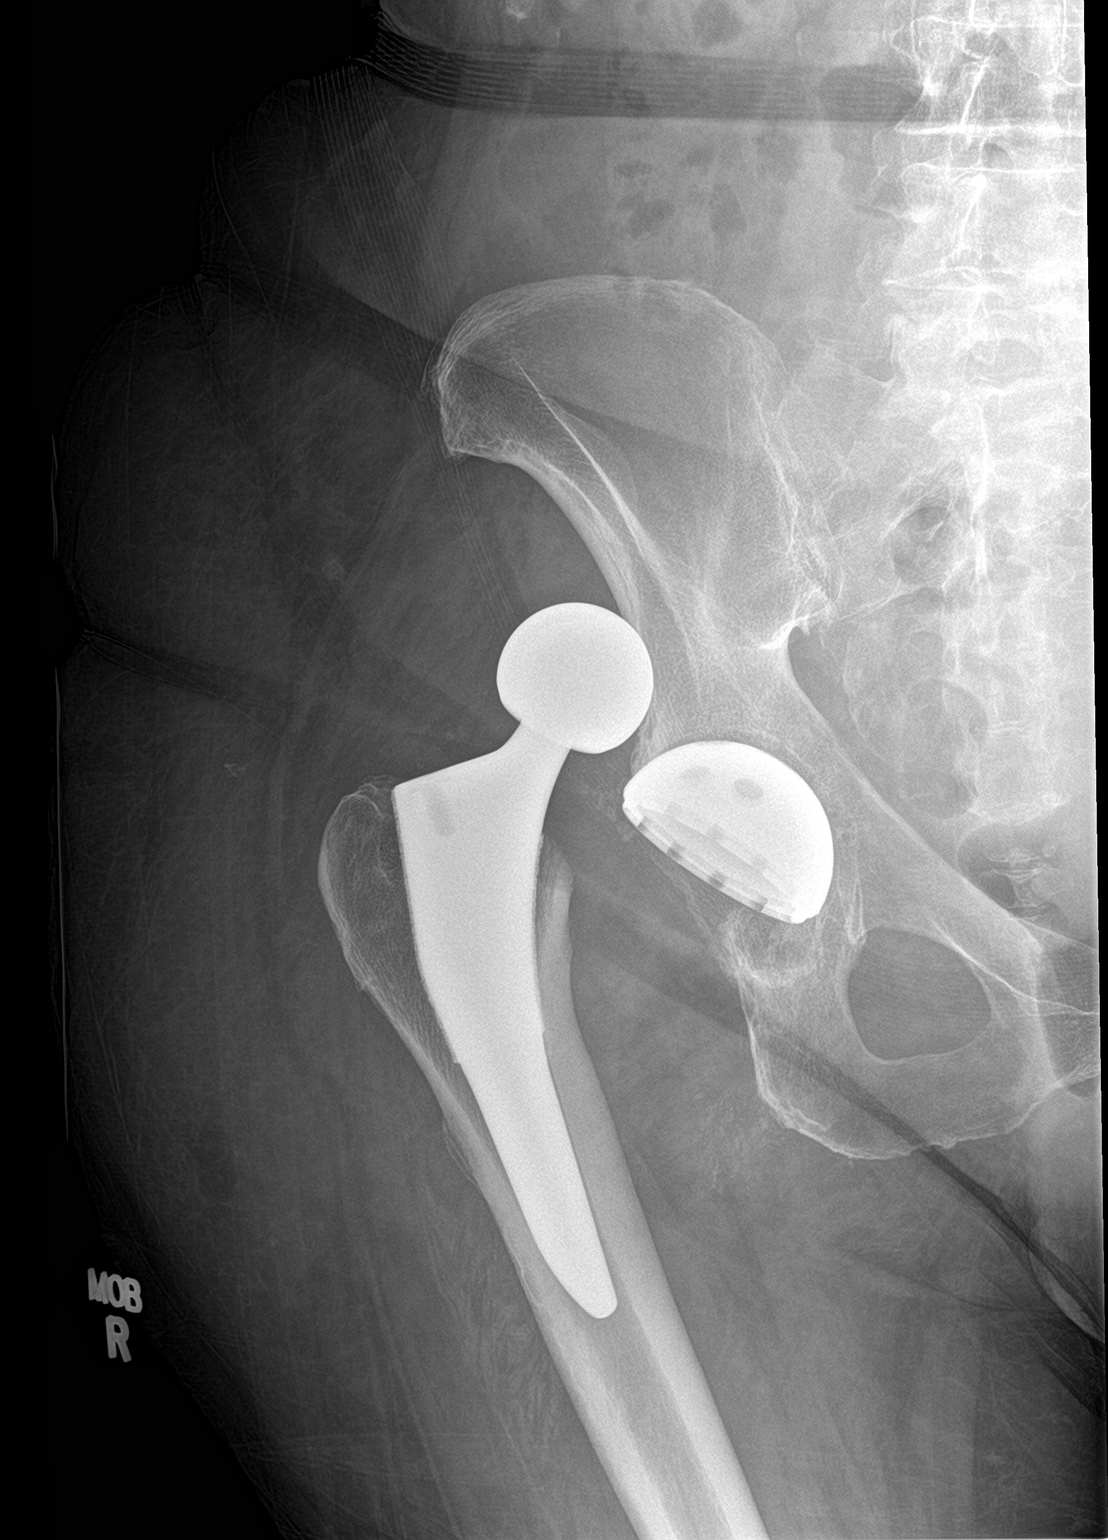

[hip lat]
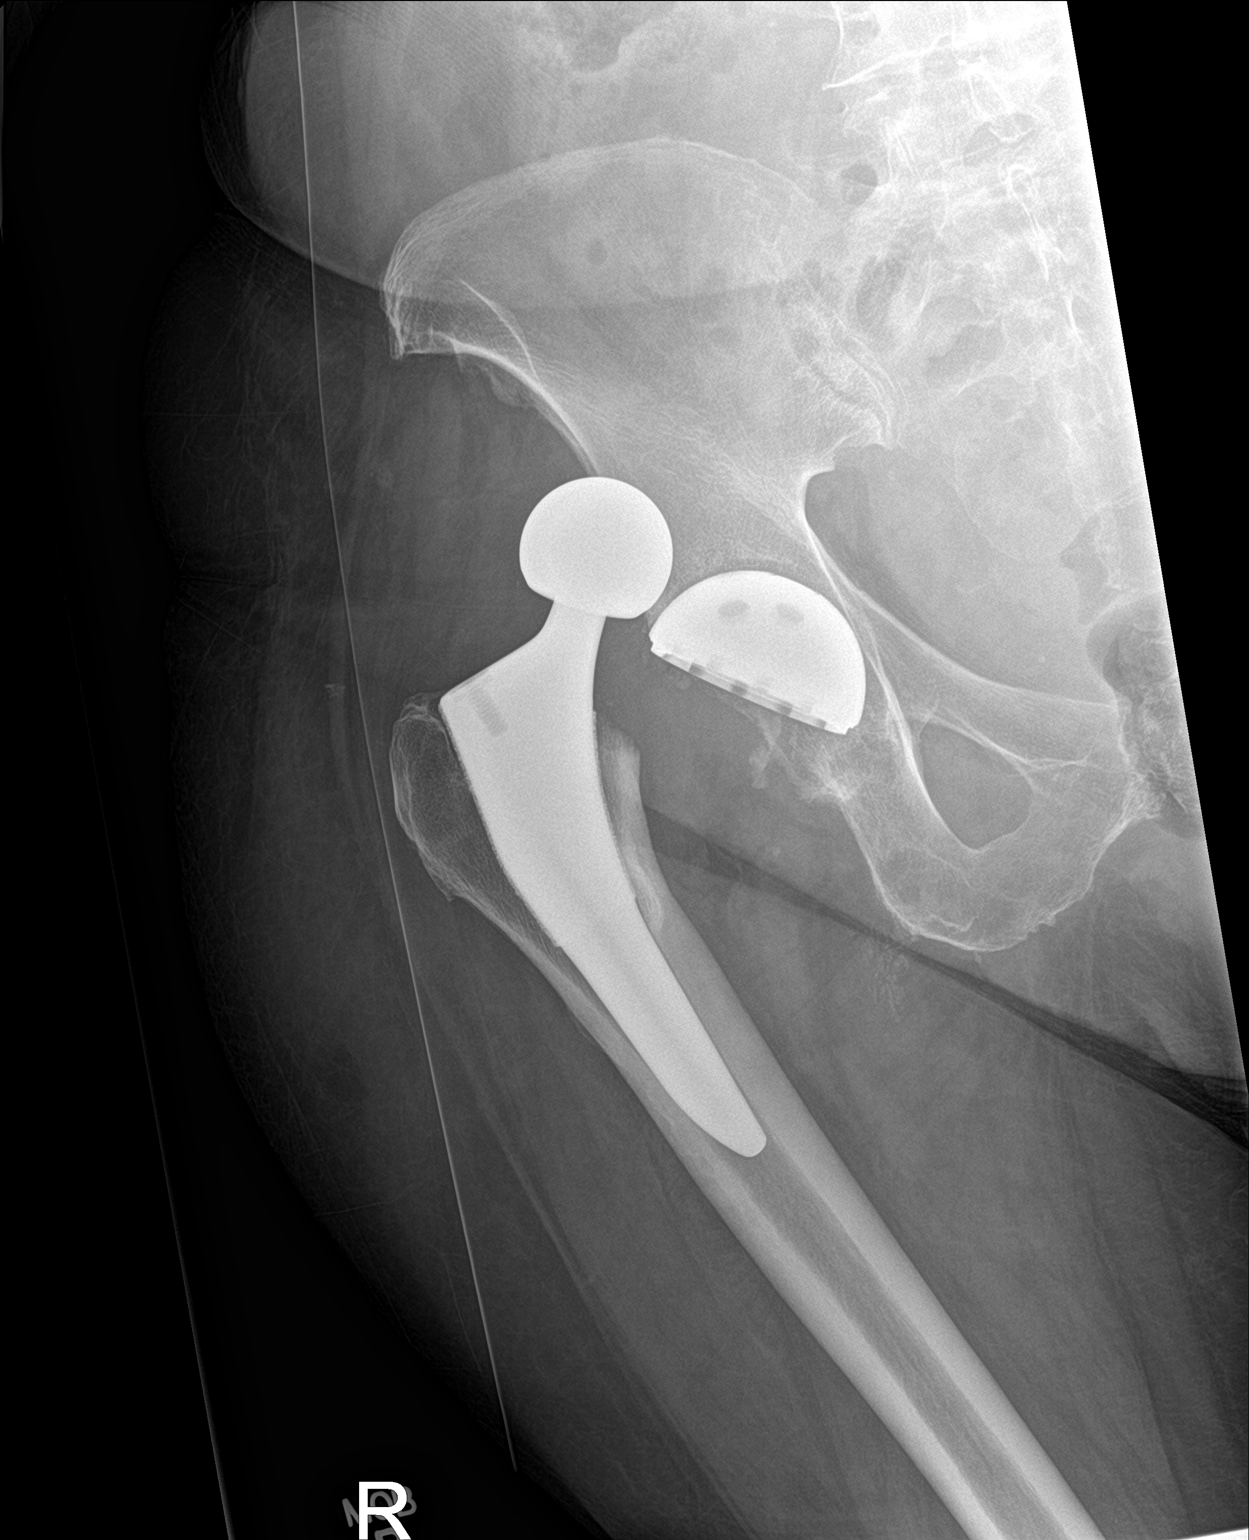

[3 of 3 positions shown; findings below may reference images not displayed]

FINDINGS: Superior dislocation of the femoral component in relation to the
acetabular component of the right hip arthroplasty. No associated
fracture. The remainder of the bony pelvis is intact. Advanced
degenerative change of the left hip is seen.
IMPRESSION: Superior dislocation of the femoral component of right hip
arthroplasty.

## 2016-12-20 IMAGING — DX DG CHEST 2V
2 series · 2 of 2 positions shown · non-contrast
Comparison: 05/04/2015

CLINICAL DATA: Right hip dislocation, preop chest x-ray.

EXAM:
CHEST  2 VIEW

[chest ap strecther]
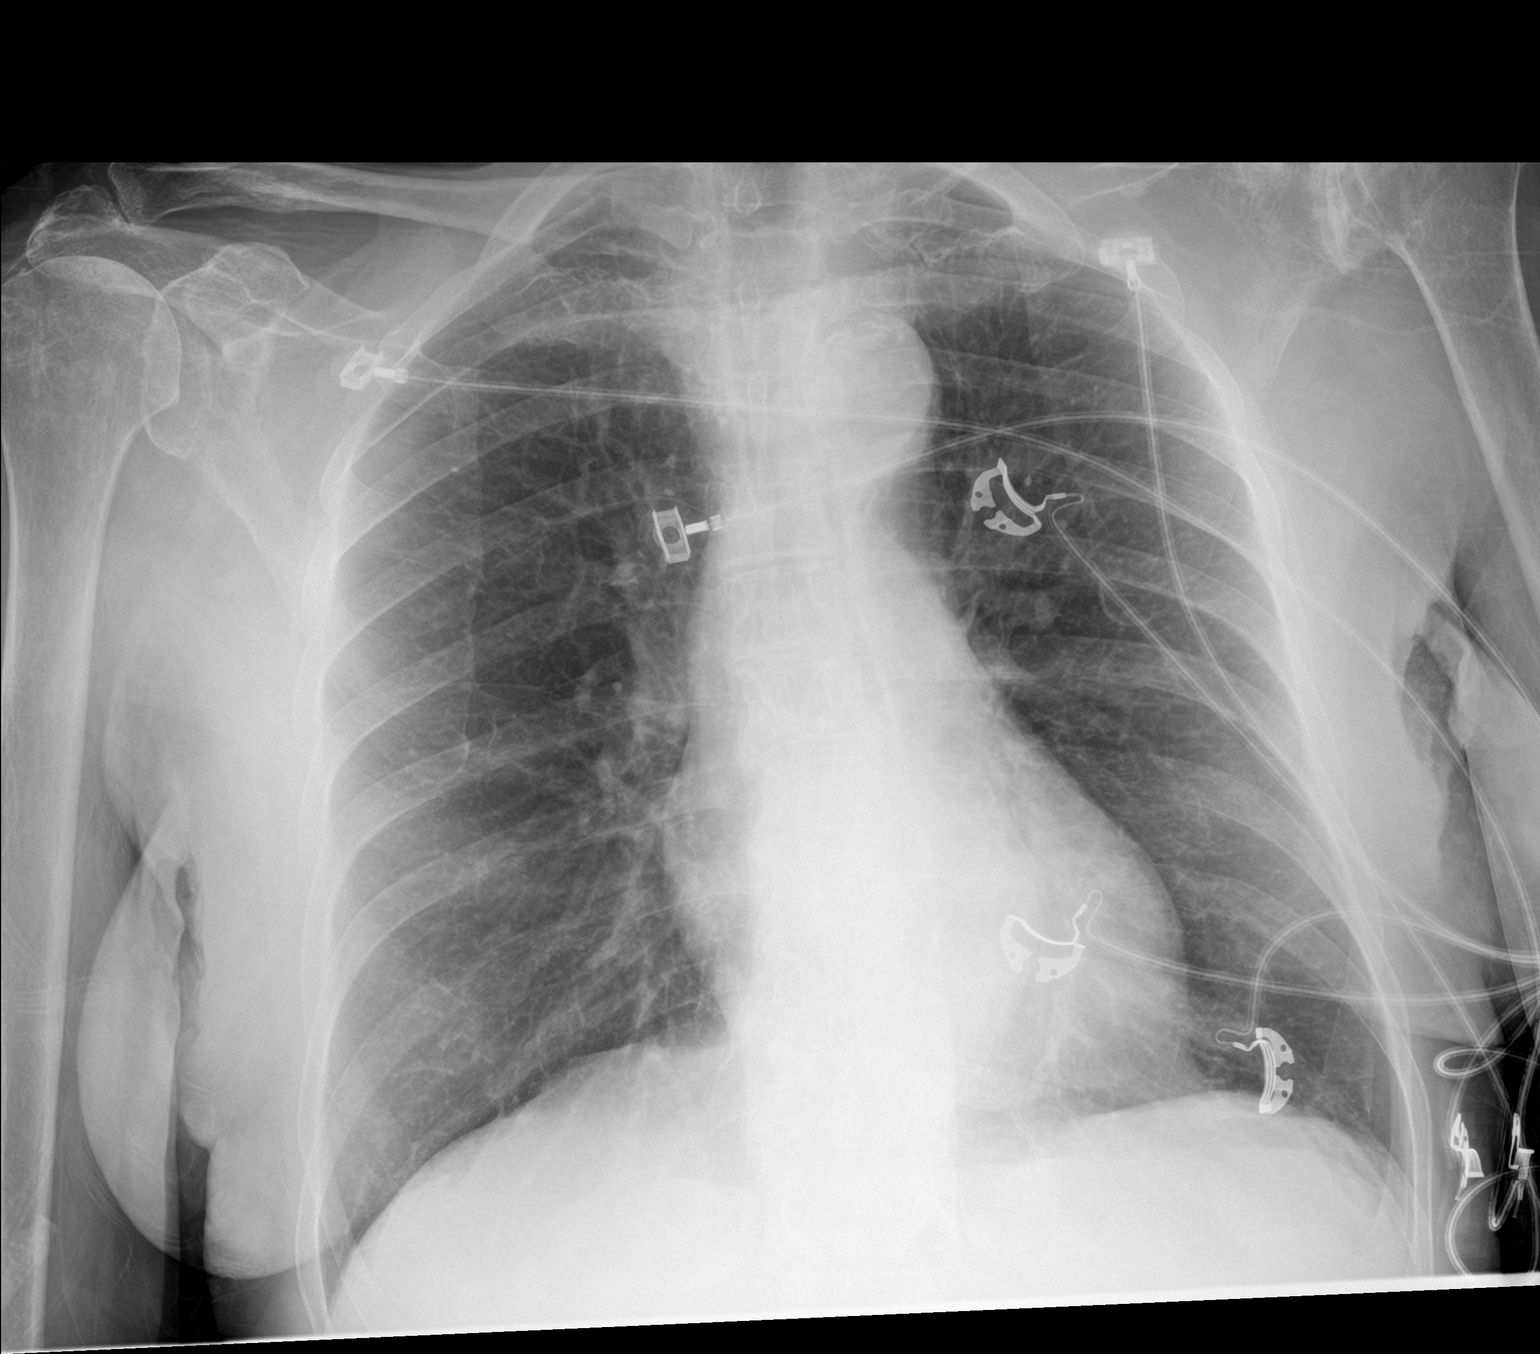

[chest lat]
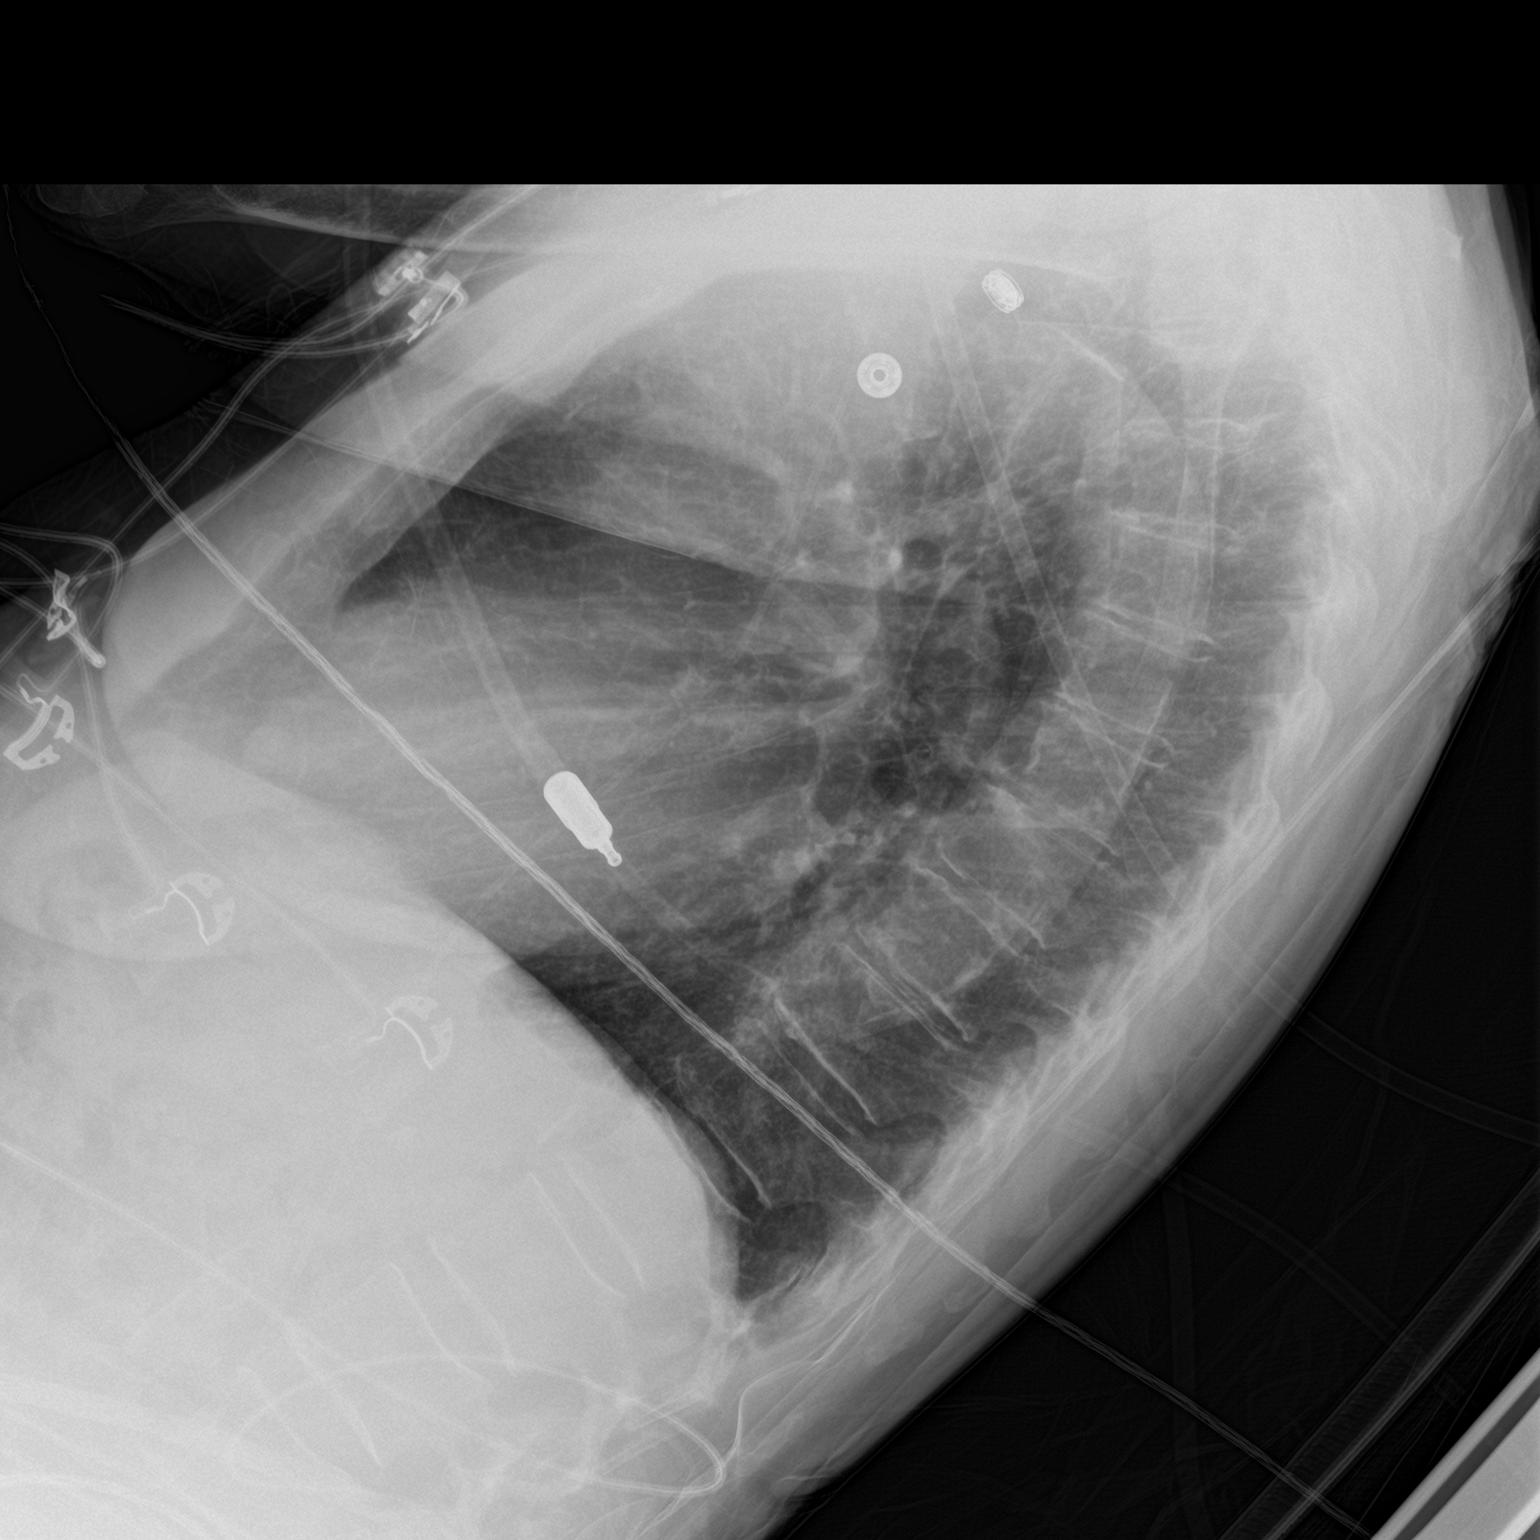

[2 of 2 positions shown; findings below may reference images not displayed]

FINDINGS: The cardiomediastinal contours are unchanged, there is tortuosity of
thoracic aorta. The lungs remain hyperinflated. Pulmonary
vasculature is normal. No consolidation, pleural effusion, or
pneumothorax. No acute osseous abnormalities are seen. Degenerative
change of both shoulders, left greater than right.
IMPRESSION: Unchanged hyperinflation.  No acute pulmonary process.

## 2016-12-25 ENCOUNTER — Ambulatory Visit
Admission: RE | Admit: 2016-12-25 | Discharge: 2016-12-25 | Disposition: A | Discharge status: Home / Self Care | Payer: Medicare Other | Source: Ambulatory Visit | Attending: Orthopedic Surgery | Admitting: Orthopedic Surgery

## 2016-12-25 DIAGNOSIS — M48062 Spinal stenosis, lumbar region with neurogenic claudication: Secondary | ICD-10-CM

## 2016-12-25 DIAGNOSIS — M545 Low back pain: Secondary | ICD-10-CM | POA: Diagnosis not present

## 2016-12-25 MED ORDER — METHYLPREDNISOLONE ACETATE 40 MG/ML INJ SUSP (RADIOLOG
120.0000 mg | Freq: Once | INTRAMUSCULAR | Status: AC
Start: 2016-12-25 — End: 2016-12-25
  Administered 2016-12-25: 120 mg via EPIDURAL

## 2016-12-25 MED ORDER — IOPAMIDOL (ISOVUE-M 200) INJECTION 41%
1.0000 mL | Freq: Once | INTRAMUSCULAR | Status: AC
  Administered 2016-12-25: 1 mL via EPIDURAL

## 2016-12-25 NOTE — Discharge Instructions (Signed)

## 2017-01-12 ENCOUNTER — Ambulatory Visit: Payer: Medicare Other | Admitting: Orthopedic Surgery

## 2017-01-23 ENCOUNTER — Ambulatory Visit (INDEPENDENT_AMBULATORY_CARE_PROVIDER_SITE_OTHER): Payer: Medicare Other | Admitting: Orthopedic Surgery

## 2017-01-23 VITALS — BP 158/73 | HR 81 | Ht 67.0 in | Wt 161.0 lb

## 2017-01-23 DIAGNOSIS — M48062 Spinal stenosis, lumbar region with neurogenic claudication: Secondary | ICD-10-CM | POA: Diagnosis not present

## 2017-01-23 DIAGNOSIS — Z96642 Presence of left artificial hip joint: Secondary | ICD-10-CM

## 2017-01-23 NOTE — Progress Notes (Signed)
Chief Complaint  Patient presents with  . Follow-up    s/p ESI   Left total hip in December 2017   The patient had her epidural injection. Again she's had 2 total hips one on the right one on the left. She developed spinal stenosis with left hip pain. The injection did help with decreased pain on the left side  She still feels on even though on clinical exam she has equal malleoli. I think her pelvic obliquity from spinal stenosis with degenerative scoliosis is contributing  We recommend that she come back for her annual x-ray and she will call the epidural center if she wants another injection.

## 2017-03-30 DIAGNOSIS — E039 Hypothyroidism, unspecified: Secondary | ICD-10-CM | POA: Diagnosis not present

## 2017-03-30 DIAGNOSIS — Z79899 Other long term (current) drug therapy: Secondary | ICD-10-CM | POA: Diagnosis not present

## 2017-03-30 DIAGNOSIS — I10 Essential (primary) hypertension: Secondary | ICD-10-CM | POA: Diagnosis not present

## 2017-03-30 DIAGNOSIS — E785 Hyperlipidemia, unspecified: Secondary | ICD-10-CM | POA: Diagnosis not present

## 2017-04-06 DIAGNOSIS — E039 Hypothyroidism, unspecified: Secondary | ICD-10-CM | POA: Diagnosis not present

## 2017-04-06 DIAGNOSIS — Z0001 Encounter for general adult medical examination with abnormal findings: Secondary | ICD-10-CM | POA: Diagnosis not present

## 2017-04-06 DIAGNOSIS — I10 Essential (primary) hypertension: Secondary | ICD-10-CM | POA: Diagnosis not present

## 2017-04-24 ENCOUNTER — Encounter (HOSPITAL_COMMUNITY): Payer: Self-pay | Admitting: Physical Therapy

## 2017-04-24 NOTE — Therapy (Signed)
[facility] [address] [place], [place], [address] Phone: [phone]   Fax:  [phone]  Patient Details  Name: [name] MRN: [id number] Date of Birth: 07/22/1933 Referring Provider:  No ref. provider found  Encounter Date: 04/24/2017  PHYSICAL THERAPY DISCHARGE SUMMARY  Visits from Start of Care: 2  Current functional level related to goals / functional outcomes: See most recent PT note during this episode of care   Remaining deficits: See most recent PT note during this episode of care   Education / Equipment: See most recent PT noted during this episode of care Plan: Patient agrees to discharge.  Patient goals were not met. Patient is being discharged due to not returning since the last visit.  ?????     Pt did not return after her most recent PT visit on 04/17/17. She is being discharged and episode of care is being closed.    1:26 PM,04/24/17 Sara Kiser PT, DPT [facility] [phone]  [facility] [address] [place], [place], [address] Phone: [phone]   Fax:  [phone] 

## 2017-05-11 DIAGNOSIS — D509 Iron deficiency anemia, unspecified: Secondary | ICD-10-CM | POA: Diagnosis not present

## 2017-05-18 DIAGNOSIS — I10 Essential (primary) hypertension: Secondary | ICD-10-CM | POA: Diagnosis not present

## 2017-05-18 DIAGNOSIS — D509 Iron deficiency anemia, unspecified: Secondary | ICD-10-CM | POA: Diagnosis not present

## 2017-08-14 DIAGNOSIS — D508 Other iron deficiency anemias: Secondary | ICD-10-CM | POA: Diagnosis not present

## 2017-08-14 DIAGNOSIS — Z79899 Other long term (current) drug therapy: Secondary | ICD-10-CM | POA: Diagnosis not present

## 2017-08-20 ENCOUNTER — Ambulatory Visit (INDEPENDENT_AMBULATORY_CARE_PROVIDER_SITE_OTHER): Payer: Medicare Other | Admitting: Orthopedic Surgery

## 2017-08-20 ENCOUNTER — Ambulatory Visit (INDEPENDENT_AMBULATORY_CARE_PROVIDER_SITE_OTHER): Payer: Medicare Other

## 2017-08-20 VITALS — BP 150/96 | HR 70 | Ht 65.0 in | Wt 150.0 lb

## 2017-08-20 DIAGNOSIS — Z96642 Presence of left artificial hip joint: Secondary | ICD-10-CM

## 2017-08-20 NOTE — Progress Notes (Signed)
Progress Note   Patient ID: Cheryl RobertsSylvia P Griffith, female   DOB: 01-29-1933, 83 y.o.   MRN: [id number]  Chief Complaint  Patient presents with  . Follow-up    Recheck on left hip , DOS 08-07-16.    HPI 83 year old female status post bilateral total hip replacements  She had 2 dislocations of the right hip treated with closed reduction successfully  She had a left total hip 1 year ago she is here for one-year follow-up  She has a shoe buildup inside the shoe on the left side    Review of Systems  Constitutional: Negative for chills and fever.  Neurological: Negative for tingling and focal weakness.   No outpatient medications have been marked as taking for the 08/20/17 encounter (Office Visit) with Vickki HearingHarrison, Stanley E, MD.    Allergies  Allergen Reactions  . Sulfa Antibiotics Other (See Comments)    "like pens and needles all over my body, tingling"--pt states she isnt sure if she is allergic to it because she was on another medication at the same time.      BP (!) 150/96   Pulse 70   Ht 5\' 5"  (1.651 m)   Wt 150 lb (68 kg)   BMI 24.96 kg/m   Physical Exam  Constitutional: She is oriented to person, place, and time. She appears well-developed and well-nourished.  Neurological: She is alert and oriented to person, place, and time.  Psychiatric: She has a normal mood and affect. Judgment normal.  Vitals reviewed.   Clinically her leg lengths are equal however her scoliosis is causing her left hip/iliac crest to be higher than the right causing the need for shoe lift which is inside the shoe  She has normal hip flexion in both the right and left hip there is no weakness in the hip flexors.  She uses a cane for ambulation Medical decision-making Encounter Diagnosis  Name Primary?  . History of total left hip replacement 08/07/16 Yes    Today's x-ray includes an AP pelvis and a lateral of the left hip.  Both hips are stable without loosening  A dictated report is  included  Follow-up in a year repeat x-rays     Fuller CanadaStanley Harrison, MD 08/20/2017 11:58 AM

## 2017-08-21 DIAGNOSIS — D509 Iron deficiency anemia, unspecified: Secondary | ICD-10-CM | POA: Diagnosis not present

## 2017-08-21 DIAGNOSIS — Z23 Encounter for immunization: Secondary | ICD-10-CM | POA: Diagnosis not present

## 2017-08-21 DIAGNOSIS — I10 Essential (primary) hypertension: Secondary | ICD-10-CM | POA: Diagnosis not present

## 2017-10-03 DIAGNOSIS — H16223 Keratoconjunctivitis sicca, not specified as Sjogren's, bilateral: Secondary | ICD-10-CM | POA: Diagnosis not present

## 2017-10-03 DIAGNOSIS — Z961 Presence of intraocular lens: Secondary | ICD-10-CM | POA: Diagnosis not present

## 2017-10-03 DIAGNOSIS — H524 Presbyopia: Secondary | ICD-10-CM | POA: Diagnosis not present

## 2017-10-25 ENCOUNTER — Ambulatory Visit (INDEPENDENT_AMBULATORY_CARE_PROVIDER_SITE_OTHER): Payer: Medicare Other | Admitting: Otolaryngology

## 2017-11-05 ENCOUNTER — Encounter: Payer: Self-pay | Admitting: Orthopedic Surgery

## 2017-11-05 DIAGNOSIS — L718 Other rosacea: Secondary | ICD-10-CM | POA: Diagnosis not present

## 2017-11-05 DIAGNOSIS — Z1283 Encounter for screening for malignant neoplasm of skin: Secondary | ICD-10-CM | POA: Diagnosis not present

## 2017-12-11 DIAGNOSIS — D508 Other iron deficiency anemias: Secondary | ICD-10-CM | POA: Diagnosis not present

## 2017-12-18 DIAGNOSIS — D509 Iron deficiency anemia, unspecified: Secondary | ICD-10-CM | POA: Diagnosis not present

## 2017-12-18 DIAGNOSIS — I10 Essential (primary) hypertension: Secondary | ICD-10-CM | POA: Diagnosis not present

## 2018-02-26 ENCOUNTER — Encounter: Payer: Self-pay | Admitting: Gastroenterology

## 2018-03-11 DIAGNOSIS — M25561 Pain in right knee: Secondary | ICD-10-CM | POA: Diagnosis not present

## 2018-03-27 ENCOUNTER — Ambulatory Visit: Payer: Medicare Other

## 2018-03-27 ENCOUNTER — Ambulatory Visit (INDEPENDENT_AMBULATORY_CARE_PROVIDER_SITE_OTHER): Payer: Medicare Other | Admitting: Orthopedic Surgery

## 2018-03-27 ENCOUNTER — Ambulatory Visit (INDEPENDENT_AMBULATORY_CARE_PROVIDER_SITE_OTHER): Payer: Medicare Other

## 2018-03-27 VITALS — BP 170/88 | HR 76 | Ht 65.0 in | Wt 155.0 lb

## 2018-03-27 DIAGNOSIS — M25561 Pain in right knee: Secondary | ICD-10-CM | POA: Diagnosis not present

## 2018-03-27 DIAGNOSIS — M1712 Unilateral primary osteoarthritis, left knee: Secondary | ICD-10-CM | POA: Diagnosis not present

## 2018-03-27 DIAGNOSIS — M1711 Unilateral primary osteoarthritis, right knee: Secondary | ICD-10-CM | POA: Diagnosis not present

## 2018-03-27 DIAGNOSIS — M25562 Pain in left knee: Secondary | ICD-10-CM

## 2018-03-27 NOTE — Progress Notes (Signed)
NEW PROBLEM, ESTABLISHED PATIENT // OFFICE VISI  Chief Complaint  Patient presents with  . Knee Pain    Right knee pain, referred by Dr. Ouida Sills.     84 year old female status post bilateral total hip replacements presents for evaluation of bilateral knee pain  SHE C/O BILATERAL KNEE PAIN X 2-3 MONTHS RT>LT  She reports trouble walking painful gait decreased range of motion swelling and crepitation with no history of trauma  She was started on Tylenol arthritis with the addition of ice and heat as needed and advised to rest the knee as well.   Review of Systems  Constitutional: Negative for fever and malaise/fatigue.  Musculoskeletal: Positive for back pain and joint pain.  Neurological: Negative for tingling.     Past Medical History:  Diagnosis Date  . Anxiety   . Arthritis   . Hypertension   . Thyroid disease     Past Surgical History:  Procedure Laterality Date  . BREAST SURGERY  1993   L BREAST BX - BENIGN  . HIP CLOSED REDUCTION Right 06/20/2015   Procedure: CLOSED REDUCTION RIGHT HIP;  Surgeon: Vickki Hearing, MD;  Location: [facility];  Service: Orthopedics;  Laterality: Right;  . TONSILLECTOMY    . TOTAL HIP ARTHROPLASTY Right 05/12/2015   Procedure: RIGHT TOTAL HIP ARTHROPLASTY;  Surgeon: Ranee Gosselin, MD;  Location: [facility];  Service: Orthopedics;  Laterality: Right;  . TOTAL HIP ARTHROPLASTY Left 08/17/2016   Procedure: TOTAL HIP ARTHROPLASTY;  Surgeon: Vickki Hearing, MD;  Location: [facility];  Service: Orthopedics;  Laterality: Left;  . TUBAL LIGATION      No family history on file. Social History   Tobacco Use  . Smoking status: Never Smoker  . Smokeless tobacco: Never Used  Substance Use Topics  . Alcohol use: Yes    Comment: RARE  . Drug use: No    Allergies  Allergen Reactions  . Sulfa Antibiotics Other (See Comments)    "like pens and needles all over my body, tingling"--pt states she isnt sure if she is allergic to it because she was on  another medication at the same time.     No outpatient medications have been marked as taking for the 03/27/18 encounter (Office Visit) with Vickki Hearing, MD.    BP (!) 170/88   Pulse 76   Ht 5\' 5"  (1.651 m)   Wt 155 lb (70.3 kg)   BMI 25.79 kg/m   Physical Exam  Constitutional: She appears well-developed and well-nourished. No distress.  Eyes: Pupils are equal, round, and reactive to light. Conjunctivae and EOM are normal. Right eye exhibits no discharge. Left eye exhibits no discharge. No scleral icterus.  Cardiovascular: Regular rhythm and intact distal pulses.  Musculoskeletal:       Right knee: She exhibits effusion.       Left knee: She exhibits effusion.  Skin: Skin is warm and dry. Capillary refill takes less than 2 seconds. No rash noted. She is not diaphoretic. No erythema. No pallor.  Psychiatric: She has a normal mood and affect. Her behavior is normal. Judgment and thought content normal.  Nursing note and vitals reviewed.   Right Knee Exam   Muscle Strength  The patient has normal right knee strength.  Tenderness  The patient is experiencing tenderness in the lateral joint line.  Range of Motion  Extension:  5 normal  Flexion:  100 normal   Tests  McMurray:  Medial - negative Lateral - negative Varus: negative Valgus:  negative Drawer:  Anterior - negative    Posterior - negative  Other  Erythema: absent Scars: absent Sensation: normal Pulse: present Swelling: none Effusion: effusion present   Left Knee Exam   Muscle Strength  The patient has normal left knee strength.  Tenderness  The patient is experiencing no tenderness.   Range of Motion  Extension: normal  Flexion: normal Left knee flexion: 125.   Tests  McMurray:  Medial - negative Lateral - negative Varus: negative Valgus: negative Drawer:  Anterior - negative     Posterior - negative  Other  Erythema: absent Scars: absent Sensation: normal Pulse: present Swelling:  none Effusion: effusion present        MEDICAL DECISION SECTION  Xrays were done at [facility]- SEE REPORT   My independent reading of xrays:  Bilateral knees see report x-ray shows arthritis in valgus with effusion both knees  Dr. Alonza SmokerFagan's office notes indicate patient had 2 to 3 months of bilateral knee pain right worse than left with difficulty walking no recent injury was reported he started her with arthritis strength Tylenol 2 tablets twice a day rest ice or heat as needed   Encounter Diagnoses  Name Primary?  . Primary osteoarthritis of right knee Yes  . Primary osteoarthritis of left knee     PLAN: (Rx., injectx, surgery, frx, mri/ct) I discussed possibility of replacements versus nonoperative treatment.  The patient wants to try injection first  Procedure note right knee injection verbal consent was obtained to inject right knee joint  Timeout was completed to confirm the site of injection  The medications used were 40 mg of Depo-Medrol and 1% lidocaine 3 cc  Anesthesia was provided by ethyl chloride and the skin was prepped with alcohol.  After cleaning the skin with alcohol a 20-gauge needle was used to inject the right knee joint. There were no complications. A sterile bandage was applied.  Follow-up in a month  No orders of the defined types were placed in this encounter.   Fuller CanadaStanley Harrison, MD  03/27/2018 9:34 AM

## 2018-04-24 ENCOUNTER — Ambulatory Visit (INDEPENDENT_AMBULATORY_CARE_PROVIDER_SITE_OTHER): Payer: Medicare Other | Admitting: Orthopedic Surgery

## 2018-04-24 ENCOUNTER — Encounter: Payer: Self-pay | Admitting: Orthopedic Surgery

## 2018-04-24 VITALS — BP 185/82 | HR 76 | Ht 65.0 in | Wt 155.0 lb

## 2018-04-24 DIAGNOSIS — M1711 Unilateral primary osteoarthritis, right knee: Secondary | ICD-10-CM

## 2018-04-24 NOTE — Patient Instructions (Signed)
You have decided to proceed with knee replacement surgery. You have decided not to continue with nonoperative measures such as but not limited to oral medication, weight loss, activity modification, physical therapy, bracing, or injection.  We will perform the procedure commonly known as total knee replacement. Some of the risks associated with knee replacement surgery include but are not limited to Bleeding Infection Swelling Stiffness Blood clot Pain that persists even after surgery  Infection is especially devastating complication of knee surgery although rare. If infection does occur your implant will usually have to be removed and several surgeries and antibiotics will be needed to eradicate the infection prior to performing a repeat replacement.   In some cases amputation is required to eradicate the infection. In other rare cases a knee fusion is needed   In compliance with recent [place] law in federal regulation regarding opioid use and abuse and addiction, we will taper (stop) opioid medication after 6 weeks.  If you're not comfortable with these risks and would like to continue with nonoperative treatment please let Dr. Harrison know prior to your surgery.  

## 2018-04-24 NOTE — Progress Notes (Signed)
PREOP CONSULT  Chief Complaint  Patient presents with  . Knee Pain    right    MEDICAL DECISION SECTION  X-rays left and right knee   Bilateral knee pain   The right knee shows valgus alignment more than physiologic joint space narrowing is noted symmetric, osteopenia is noted small effusion is noted      On the left knee we also see valgus knee may be 7 8 degrees small effusion Joint space narrowing is symmetric   Bilateral knee arthritis primarily lateral compartment  Encounter Diagnosis  Name Primary?  . Primary osteoarthritis of right knee Yes     PLAN:   Surgical procedure planned: Right total knee  The procedure has been fully reviewed with the patient; The risks and benefits of surgery have been discussed and explained and understood. Alternative treatment has also been reviewed, questions were encouraged and answered. The postoperative plan is also been reviewed.  Nonsurgical treatment as described in the history and physical section was attempted and unsuccessful and the patient has agreed to proceed with surgical intervention to improve their situation.  No orders of the defined types were placed in this encounter.      Chief Complaint  Patient presents with  . Knee Pain    right    84 year old female presents for evaluation of right knee after injection  She is 84 years old she is had 2 hip replacements.  She is currently ambulatory with a cane with pain lateral aspect right knee now for several months dull aching pain moderate to severe associated with painful weightbearing and difficulty with activities of daily living  Nonoperative treatment has included but was not limited to activity modification use of a cane Tylenol arthritis and cortisone injection  Patient wishes to proceed with knee replacement   Review of Systems  Musculoskeletal: Positive for joint pain.  Neurological: Negative.   All other systems reviewed and are  negative.    Past Medical History:  Diagnosis Date  . Anxiety   . Arthritis   . Hypertension   . Thyroid disease     Past Surgical History:  Procedure Laterality Date  . BREAST SURGERY  1993   L BREAST BX - BENIGN  . HIP CLOSED REDUCTION Right 06/20/2015   Procedure: CLOSED REDUCTION RIGHT HIP;  Surgeon: Vickki Hearing, MD;  Location: [facility];  Service: Orthopedics;  Laterality: Right;  . TONSILLECTOMY    . TOTAL HIP ARTHROPLASTY Right 05/12/2015   Procedure: RIGHT TOTAL HIP ARTHROPLASTY;  Surgeon: Ranee Gosselin, MD;  Location: [facility];  Service: Orthopedics;  Laterality: Right;  . TOTAL HIP ARTHROPLASTY Left 08/17/2016   Procedure: TOTAL HIP ARTHROPLASTY;  Surgeon: Vickki Hearing, MD;  Location: [facility];  Service: Orthopedics;  Laterality: Left;  . TUBAL LIGATION      Family History  Problem Relation Age of Onset  . Congestive Heart Failure Mother   . Kidney failure Mother   . Stroke Father    Social History   Tobacco Use  . Smoking status: Never Smoker  . Smokeless tobacco: Never Used  Substance Use Topics  . Alcohol use: Yes    Comment: RARE  . Drug use: No    Allergies  Allergen Reactions  . Sulfa Antibiotics Other (See Comments)    "like pens and needles all over my body, tingling"--pt states she isnt sure if she is allergic to it because she was on another medication at the same time.  Current Meds  Medication Sig  . acetaminophen (TYLENOL) 500 MG tablet Take 1,000 mg by mouth every 6 (six) hours as needed for mild pain.  . clonazePAM (KLONOPIN) 0.5 MG tablet TAKE 1 2 TO 1 (ONE HALF TO ONE) TABLET BY MOUTH TWICE DAILY AS NEEDED  . levothyroxine (SYNTHROID, LEVOTHROID) 112 MCG tablet Take 112 mcg by mouth daily before breakfast.  . lisinopril-hydrochlorothiazide (PRINZIDE,ZESTORETIC) 10-12.5 MG tablet Take 1 tablet by mouth daily.  Marland Kitchen. OVER THE COUNTER MEDICATION Eye drops prn burning eyes  . traZODone (DESYREL) 50 MG tablet Take 100 mg by mouth at  bedtime.    BP (!) 185/82   Pulse 76   Ht 5\' 5"  (1.651 m)   Wt 155 lb (70.3 kg)   BMI 25.79 kg/m   Physical Exam  Constitutional: She is oriented to person, place, and time. She appears well-developed and well-nourished.  Musculoskeletal:       Right knee: She exhibits effusion.  Neurological: She is alert and oriented to person, place, and time.  Psychiatric: She has a normal mood and affect. Judgment normal.  Vitals reviewed.   Right Knee Exam   Muscle Strength  The patient has normal right knee strength.  Tenderness  The patient is experiencing no tenderness.   Range of Motion  Extension: normal Right knee extension: 2.  Flexion:  120 normal   Tests  McMurray:  Medial - negative Lateral - negative Varus: negative Valgus: negative Drawer:  Anterior - negative    Posterior - negative  Other  Erythema: absent Scars: absent Sensation: normal Pulse: present Swelling: none Effusion: effusion present   Left Knee Exam   Muscle Strength  The patient has normal left knee strength.  Tenderness  The patient is experiencing no tenderness.   Range of Motion  Extension: normal  Flexion: normal   Tests  McMurray:  Medial - negative Lateral - negative Varus: negative Valgus: negative Drawer:  Anterior - negative     Posterior - negative  Other  Erythema: absent Scars: absent Sensation: normal Pulse: present Swelling: none        Fuller CanadaStanley Oseias Horsey, MD 04/24/2018 11:01 AM

## 2018-04-26 ENCOUNTER — Telehealth: Payer: Self-pay | Admitting: Orthopedic Surgery

## 2018-04-26 DIAGNOSIS — Z79899 Other long term (current) drug therapy: Secondary | ICD-10-CM | POA: Diagnosis not present

## 2018-04-26 DIAGNOSIS — I10 Essential (primary) hypertension: Secondary | ICD-10-CM | POA: Diagnosis not present

## 2018-04-26 DIAGNOSIS — E785 Hyperlipidemia, unspecified: Secondary | ICD-10-CM | POA: Diagnosis not present

## 2018-04-26 DIAGNOSIS — E039 Hypothyroidism, unspecified: Secondary | ICD-10-CM | POA: Diagnosis not present

## 2018-04-26 NOTE — Telephone Encounter (Signed)
Patient called to speak with you in reference to her surgery date.  Please call and advise

## 2018-05-03 DIAGNOSIS — E039 Hypothyroidism, unspecified: Secondary | ICD-10-CM | POA: Diagnosis not present

## 2018-05-03 DIAGNOSIS — N183 Chronic kidney disease, stage 3 (moderate): Secondary | ICD-10-CM | POA: Diagnosis not present

## 2018-05-03 DIAGNOSIS — I10 Essential (primary) hypertension: Secondary | ICD-10-CM | POA: Diagnosis not present

## 2018-05-03 DIAGNOSIS — E785 Hyperlipidemia, unspecified: Secondary | ICD-10-CM | POA: Diagnosis not present

## 2018-05-07 ENCOUNTER — Other Ambulatory Visit: Payer: Self-pay | Admitting: Orthopedic Surgery

## 2018-05-07 NOTE — Telephone Encounter (Signed)
She has been scheduled for the Total knee replacement on Tuesday Sept 3rd, she would like to be placed at [facility] for rehab following the surgery I told her I would let her know, but she has to meet certain requirements for insurance coverage  [facility] understanding / [facility]

## 2018-05-27 ENCOUNTER — Other Ambulatory Visit: Payer: Self-pay | Admitting: Orthopedic Surgery

## 2018-05-27 ENCOUNTER — Telehealth: Payer: Self-pay | Admitting: Orthopedic Surgery

## 2018-05-27 DIAGNOSIS — M1711 Unilateral primary osteoarthritis, right knee: Secondary | ICD-10-CM

## 2018-05-27 NOTE — Patient Instructions (Signed)
[name]  05/27/2018     @PREFPERIOPPHARMACY @   Your procedure is scheduled on  06/04/2018   Report to [facility] at  845   A.M.  Call this number if you have problems the morning of surgery:  [phone]   Remember:  Do not eat or drink after midnight.  You may drink clear liquids until 12 midnight 06/03/2018 .  Clear liquids allowed are:                    Water, Juice (non-citric and without pulp), Carbonated beverages, Clear Tea, Black Coffee only, Plain Jell-O only, Gatorade and Plain Popsicles only    Take these medicines the morning of surgery with A SIP OF WATER  Clonazepam (if needed), levothyroxine, lisinopril.    Do not wear jewelry, make-up or nail polish.  Do not wear lotions, powders, or perfumes, or deodorant.  Do not shave 48 hours prior to surgery.  Men may shave face and neck.  Do not bring valuables to the hospital.  [facility] is not responsible for any belongings or valuables.  Contacts, dentures or bridgework may not be worn into surgery.  Leave your suitcase in the car.  After surgery it may be brought to your room.  For patients admitted to the hospital, discharge time will be determined by your treatment team.  Patients discharged the day of surgery will not be allowed to drive home.   Name and phone number of your driver:   family Special instructions:  None  Please read over the following fact sheets that you were given. Anesthesia Post-op Instructions and Care and Recovery After Surgery       Total Knee Replacement Total knee replacement is a procedure to replace the knee joint with an artificial (prosthetic) knee joint. The purpose of this surgery is to reduce knee pain and improve knee function. The prosthetic knee joint (prosthesis) may be made of metal, plastic or ceramic. It replaces parts of the thigh bone (femur), lower leg bone (tibia), and kneecap (patella) that are removed during the procedure. Tell a  health care provider about:  Any allergies you have.  All medicines you are taking, including vitamins, herbs, eye drops, creams, and over-the-counter medicines.  Any problems you or family members have had with anesthetic medicines.  Any blood disorders you have.  Any surgeries you have had.  Any medical conditions you have.  Whether you are pregnant or may be pregnant. What are the risks? Generally, this is a safe procedure. However, problems may occur, including:  Infection.  Bleeding.  Allergic reactions to medicines.  Damage to other structures or organs.  Decreased range of motion of the knee.  Instability of the knee.  Loosening of the prosthetic joint.  Knee pain that does not go away (chronic pain).  What happens before the procedure?  Ask your health care provider about: ? Changing or stopping your regular medicines. This is especially important if you are taking diabetes medicines or blood thinners. ? Taking medicines such as aspirin and ibuprofen. These medicines can thin your blood. Do not take these medicines before your procedure if your health care provider instructs you not to.  Have dental care and routine cleanings completed before your procedure. Plan to not have dental work done for 3 months after your procedure. Germs from anywhere in your body, including your mouth, can travel to  your new joint and infect it.  Follow instructions from your health care provider about eating or drinking restrictions.  Ask your health care provider how your surgical site will be marked or identified.  You may be given antibiotic medicine to help prevent infection.  If your health care provider prescribes physical therapy, do exercises as instructed.  Do not use any tobacco products, such as cigarettes, chewing tobacco, or e-cigarettes. If you need help quitting, ask your health care provider.  You may have a physical exam.  You may have tests, such  as: ? X-rays. ? MRI. ? CT scan. ? Bone scans.  You may have a blood or urine sample taken.  Plan to have someone take you home after the procedure.  If you will be going home right after the procedure, plan to have someone with you for at least 24 hours. It is recommended that you have someone to help care for you for at least 4-6 weeks after your procedure. What happens during the procedure?  To reduce your risk of infection: ? Your health care team will wash or sanitize their hands. ? Your skin will be washed with soap.  An IV tube will be inserted into one of your veins.  You will be given one or more of the following: ? A medicine to help you relax (sedative). ? A medicine to numb the area (local anesthetic). ? A medicine to make you fall asleep (general anesthetic). ? A medicine that is injected into your spine to numb the area below and slightly above the injection site (spinal anesthetic). ? A medicine that is injected into an area of your body to numb everything below the injection site (regional anesthetic).  An incision will be made in your knee.  Damaged cartilage and bone will be removed from your femur, tibia, and patella.  Parts of the prosthesis (liners) will be placed over the areas of bone and cartilage that were removed. A metal liner will be placed over your femur, and plastic liners will be placed over your tibia and the underside of your patella.  One or more small tubes (drains) may be placed near your incision to help drain extra fluid from your surgical site.  Your incision will be closed with stitches (sutures), skin glue, or adhesive strips. Medicine may be applied to your incision.  A bandage (dressing) will be placed over your incision. The procedure may vary among health care providers and hospitals. What happens after the procedure?  Your blood pressure, heart rate, breathing rate, and blood oxygen level will be monitored often until the medicines  you were given have worn off.  You may continue to receive fluids and medicines through an IV tube.  You will have some pain. Pain medicines will be available to help you.  You may have fluid coming from one or more drains in your incision.  You may have to wear compression stockings. These stockings help to prevent blood clots and reduce swelling in your legs.  You will be encouraged to move around as much as possible.  You may be given a continuous passive motion machine to use at home. You will be shown how to use this machine.  Do not drive for 24 hours if you received a sedative. This information is not intended to replace advice given to you by your health care provider. Make sure you discuss any questions you have with your health care provider. Document Released: 12/25/2000 Document Revised: 10/21/2016 Document Reviewed:  08/25/2015 Elsevier Interactive Patient Education  2018 [employer]  Total Knee Replacement, Care After Refer to this sheet in the next few weeks. These instructions provide you with information about caring for yourself after your procedure. Your health care provider may also give you more specific instructions. Your treatment has been planned according to current medical practices, but problems sometimes occur. Call your health care provider if you have any problems or questions after your procedure. What can I expect after the procedure? After the procedure, it is common to have:  Pain and swelling.  A small amount of blood or clear fluid coming from your incision.  Limited range of motion.  Follow these instructions at home: Medicines  Take over-the-counter and prescription medicines only as told by your health care provider.  If you were prescribed an antibiotic medicine, take it as told by your health care provider. Do not stop taking the antibiotic even if you start to feel better.  If you were prescribed a blood thinner (anticoagulant), take it  as told by your health care provider. If you have a splint or brace:  Wear the immobilizer as told by your health care provider. Remove it only as told by your health care provider.  Loosen the immobilizer if your toes tingle, become numb, or turn cold and blue.  Do not let your immobilizer get wet if it is not waterproof.  Keep the immobilizer clean. Bathing   Do not take baths, swim, or use a hot tub until your health care provider approves. Ask your health care provider if you can take showers. You may only be allowed to take sponge baths for bathing.  If you have an immobilizer that is not waterproof, cover it with a watertight covering when you take a bath or shower.  Keep your bandage (dressing) dry until your health care provider says it can be removed. Incision care and drain care  Check your incision area and drain site every day for signs of infection. Check for: ? More redness, swelling, or pain. ? More fluid or blood. ? Warmth. ? Pus or a bad smell.  Follow instructions from your health care provider about how to take care of your incision. Make sure you: ? Wash your hands with soap and water before you change your dressing. If soap and water are not available, use hand sanitizer. ? Change your dressing as told by your health care provider. ? Leave stitches (sutures), skin glue, or adhesive strips in place. These skin closures may need to stay in place for 2 weeks or longer. If adhesive strip edges start to loosen and curl up, you may trim the loose edges. Do not remove adhesive strips completely unless your health care provider tells you to do that.  If you have a drain, follow instructions from your health care provider about caring for it. Do not remove the drain tube or any dressings around the tube opening unless your health care provider approves. Managing pain, stiffness, and swelling  If directed, put ice on your knee. ? Put ice in a plastic bag. ? Place a  towel between your skin and the bag. ? Leave the ice on for 20 minutes, 2-3 times per day.  If directed, apply heat to the affected area as often as told by your health care provider. Use the heat source that your health care provider recommends, such as a moist heat pack or a heating pad. ? Place a towel between your skin and the  heat source. ? Leave the heat on for 20-30 minutes. ? Remove the heat if your skin turns bright red. This is especially important if you are unable to feel pain, heat, or cold. You may have a greater risk of getting burned.  Move your toes often to avoid stiffness and to lessen swelling.  Raise (elevate) your knee above the level of your heart while you are sitting or lying down.  Wear elastic knee support for as long as told by your health care provider. Driving   Do not drive until your health care provider approves. Ask your health care provider when it is safe to drive if you have an immobilizer on your knee.  Do not drive or operate heavy machinery while taking prescription pain medicine.  Do not drive for 24 hours if you received a sedative. Activity  Do not lift anything that is heavier than 10 lb (4.5 kg) until your health care provider approves.  Do not play contact sports until your health care provider approves.  Avoid high-impact activities, including running, jumping rope, and jumping jacks.  Avoid sitting for a long time without moving. Get up and move around at least every few hours.  If physical therapy was prescribed, do exercises as told by your health care provider.  Return to your normal activities as told by your health care provider. Ask your health care provider what activities are safe for you. Safety  Do not use your leg to support your body weight until your health care provider approves. Use crutches or a walker as told by your health care provider. General instructions   Do not have any dental work done for at least 3  months after your surgery. When you do have dental work done, tell your dentist about your joint replacement.  Do not use any tobacco products, such as cigarettes, chewing tobacco, or e-cigarettes. If you need help quitting, ask your health care provider.  Wear compression stockings as told by your health care provider. These stockings help to prevent blood clots and reduce swelling in your legs.  If you have been sent home with a continuous passive motion machine, use it as told by your health care provider.  Drink enough fluid to keep your urine clear or pale yellow.  If you have been instructed to lose weight, follow instructions from your health care provider about how to do this safely.  Keep all follow-up visits as told by your health care provider. This is important. Contact a health care provider if:  You have more redness, swelling, or pain around your incision or drain.  You have more fluid or blood coming from your incision or drain.  Your incision or drain site feels warm to the touch.  You have pus or a bad smell coming from your incision or drain.  You have a fever.  Your incision breaks open after your health care provider removes your sutures, skin glue, or adhesive tape.  Your prosthesis feels loose.  You have knee pain that does not go away. Get help right away if:  You have a rash.  You have pain or swelling in your calf or thigh.  You have shortness of breath or difficulty breathing.  You have chest pain.  Your range of motion in your knee is getting worse. This information is not intended to replace advice given to you by your health care provider. Make sure you discuss any questions you have with your health care provider. Document Released:  04/07/2005 Document Revised: 05/22/2016 Document Reviewed: 08/25/2015 Elsevier Interactive Patient Education  2018 [employer]  Spinal Anesthesia and Epidural Anesthesia Spinal anesthesia and epidural  anesthesia are methods of numbing the body. They are done by injecting numbing medicine (anesthetic) into the back, near the spinal cord. Spinal anesthesia is usually done to numb an area at and below the place where the injection is made. It is often used during surgeries of the pelvis, hips, legs, and lower abdomen. It begins to work almost immediately. Epidural anesthesia may be done to numb an area above or below the area where the injection is made. It is often used during childbirth and after major abdominal or chest surgery. It begins to work after 10-20 minutes. Tell a health care provider about:  Any allergies you have.  All medicines you are taking, including vitamins, herbs, eye drops, creams, and over-the-counter medicines.  Any problems you or family members have had with anesthetic medicines.  Any blood disorders you have.  Any surgeries you have had.  Any medical conditions you have.  Whether you are pregnant or may be pregnant.  Any recent alcohol, tobacco, or drug use. What are the risks? Generally, this is a safe procedure. However, problems may occur, including:  Headache.  A drop in blood pressure. In some cases, this can lead to a heart attack or a stroke.  Nerve damage.  Infection.  Allergic reaction to medicines.  Seizures.  Spinal fluid leak.  Bleeding around the injection site.  Inability to breathe. If this happens, a tube may be put into your windpipe (trachea) and a machine may be used to help you breathe until the anesthetic wears off.  Long-lasting numbness, pain, or loss of function of body parts.  Nausea and vomiting.  Dizziness and fainting.  Shivering.  Itching.  What happens before the procedure? Staying hydrated Follow instructions from your health care provider about hydration, which may include:  Up to 2 hours before the procedure - you may continue to drink clear liquids, such as water, clear fruit juice, black coffee, and  plain tea.  Eating and drinking restrictions Follow instructions from your health care provider about eating and drinking, which may include:  8 hours before the procedure - stop eating heavy meals or foods such as meat, fried foods, or fatty foods.  6 hours before the procedure - stop eating light meals or foods, such as toast or cereal.  6 hours before the procedure - stop drinking milk or drinks that contain milk.  2 hours before the procedure - stop drinking clear liquids.  Medicine Ask your health care provider about:  Changing or stopping your regular medicines. This is especially important if you are taking diabetes medicines or blood thinners.  Changing or stopping your dietary supplements.  Taking medicines such as aspirin and ibuprofen. These medicines can thin your blood. Do not take these medicines before your procedure if your health care provider instructs you not to.  General instructions   Do not use any tobacco products, such as cigarettes, chewing tobacco, and electronic cigarettes, for as long as possible before your procedure. If you need help quitting, ask your health care provider.  Ask your health care provider if you will have to stay overnight at the hospital or clinic.  If you will not have to stay overnight: ? Plan to have someone take you home from the hospital or clinic. ? Plan to have someone with you for 24 hours. What happens during the  procedure?  A health care provider will put patches on your chest, a cuff around your arm, or a sensor device on your finger. These will be attached to monitors that allow your health care provider to watch your blood pressure, pulse, and oxygen levels to make sure that the anesthetic does not cause any problems.  An IV tube may be inserted into one of your veins. The tube will be used to give you fluids and medicines throughout the procedure as needed.  You may be given a medicine to help you relax  (sedative).  You will be asked to sit up or to lie on your side with your knees and your chin bent toward your chest. These positions open up the space between the bones in your back, making it easier to inject the medicine.  The area of your back where the medicine will be injected will be cleaned.  A medicine called a local anesthetic may be injected to numb the area where the spinal or epidural anesthetic will be injected.  A needle will be inserted between the bones of your back. While this is being done: ? Continue to breathe normally. ? Stay as still and quiet as you can. ? If you feel a tingling shock or pain going down your leg, tell your health care provider but try not to move.  The spinal or epidural anesthetic will be injected.  If you receive an epidural anesthetic and need more than one dose, a tiny, flexible tube (catheter) will be placed where the anesthetic was injected. Additional doses will be given through the catheter. If you need pain medicine after the procedure, the catheter will be kept in place.  If an epidural catheter has not been placed in your injection site or left there, a small bandage (dressing) will be placed over the injection site. The procedure may vary among health care providers and hospitals. What happens after the procedure?  You will need to stay in bed until your health care provider says it safe for you to walk.  Your blood pressure, heart rate, breathing rate, and blood oxygen level will be monitored until the medicines you were given have worn off.  If you have a catheter, it will be removed when it is no longer needed.  Do not drive for 24 hours if you received a sedative.  It is common to have nausea and itching. There are medicines that can help with these side effects. It is also common to have: ? Sleepiness. ? Vomiting. ? Numbness or tingling in your legs. ? Trouble urinating. This information is not intended to replace advice  given to you by your health care provider. Make sure you discuss any questions you have with your health care provider. Document Released: 12/09/2003 Document Revised: 02/29/2016 Document Reviewed: 01/10/2016 Elsevier Interactive Patient Education  2018 [employer].  Spinal Anesthesia and Epidural Anesthesia, Care After These instructions give you information about caring for yourself after your procedure. Your doctor may also give you more specific instructions. Call your doctor if you have any problems or questions after your procedure. Follow these instructions at home: For at least 24 hours after the procedure:   Do not: ? Do activities where you could fall or get hurt (injured). ? Drive. ? Use heavy machinery. ? Drink alcohol. ? Take sleeping pills or medicines that make you sleepy (drowsy). ? Make important decisions. ? Sign legal documents. ? Take care of children on your own.  Rest. Eating and  drinking  If you throw up (vomit), drink water, juice, or soup when you can drink without throwing up.  Make sure you do not feel like throwing up (are not nauseous) before you eat.  Follow the diet that is recommended by your doctor. General instructions  Have a responsible adult stay with you until you are awake and alert.  Take over-the-counter and prescription medicines only as told by your doctor.  If you smoke, do not smoke unless an adult is watching.  Keep all follow-up visits as told by your doctor. This is important. Contact a doctor if:  It has been more than one day since your procedure and you feel like throwing up.  It has been more than one day since your procedure and you throw up.  You have a rash. Get help right away if:  You have a fever.  You have a headache that lasts a long time.  You have a very bad headache.  Your vision is blurry.  You see two of a single object (double vision).  You are dizzy or light-headed.  You faint.  Your arms  or legs tingle, feel weak, or get numb.  You have trouble breathing.  You cannot pee (urinate). This information is not intended to replace advice given to you by your health care provider. Make sure you discuss any questions you have with your health care provider. Document Released: 01/10/2016 Document Revised: 05/11/2016 Document Reviewed: 01/10/2016 Elsevier Interactive Patient Education  2018 [employer].  General Anesthesia, Adult General anesthesia is the use of medicines to make a person "go to sleep" (be unconscious) for a medical procedure. General anesthesia is often recommended when a procedure:  Is long.  Requires you to be still or in an unusual position.  Is major and can cause you to lose blood.  Is impossible to do without general anesthesia.  The medicines used for general anesthesia are called general anesthetics. In addition to making you sleep, the medicines:  Prevent pain.  Control your blood pressure.  Relax your muscles.  Tell a health care provider about:  Any allergies you have.  All medicines you are taking, including vitamins, herbs, eye drops, creams, and over-the-counter medicines.  Any problems you or family members have had with anesthetic medicines.  Types of anesthetics you have had in the past.  Any bleeding disorders you have.  Any surgeries you have had.  Any medical conditions you have.  Any history of heart or lung conditions, such as heart failure, sleep apnea, or chronic obstructive pulmonary disease (COPD).  Whether you are pregnant or may be pregnant.  Whether you use tobacco, alcohol, marijuana, or street drugs.  Any history of Financial [employer].  Any history of depression or anxiety. What are the risks? Generally, this is a safe procedure. However, problems may occur, including:  Allergic reaction to anesthetics.  Lung and heart problems.  Inhaling food or liquids from your stomach into your lungs  (aspiration).  Injury to nerves.  Waking up during your procedure and being unable to move (rare).  Extreme agitation or a state of mental confusion (delirium) when you wake up from the anesthetic.  Air in the bloodstream, which can lead to stroke.  These problems are more likely to develop if you are having a major surgery or if you have an advanced medical condition. You can prevent some of these complications by answering all of your health care provider's questions thoroughly and by following all pre-procedure instructions. General  anesthesia can cause side effects, including:  Nausea or vomiting  A sore throat from the breathing tube.  Feeling cold or shivery.  Feeling tired, washed out, or achy.  Sleepiness or drowsiness.  Confusion or agitation.  What happens before the procedure? Staying hydrated Follow instructions from your health care provider about hydration, which may include:  Up to 2 hours before the procedure - you may continue to drink clear liquids, such as water, clear fruit juice, black coffee, and plain tea.  Eating and drinking restrictions Follow instructions from your health care provider about eating and drinking, which may include:  8 hours before the procedure - stop eating heavy meals or foods such as meat, fried foods, or fatty foods.  6 hours before the procedure - stop eating light meals or foods, such as toast or cereal.  6 hours before the procedure - stop drinking milk or drinks that contain milk.  2 hours before the procedure - stop drinking clear liquids.  Medicines  Ask your health care provider about: ? Changing or stopping your regular medicines. This is especially important if you are taking diabetes medicines or blood thinners. ? Taking medicines such as aspirin and ibuprofen. These medicines can thin your blood. Do not take these medicines before your procedure if your health care provider instructs you not to. ? Taking new  dietary supplements or medicines. Do not take these during the week before your procedure unless your health care provider approves them.  If you are told to take a medicine or to continue taking a medicine on the day of the procedure, take the medicine with sips of water. General instructions   Ask if you will be going home the same day, the following day, or after a longer hospital stay. ? Plan to have someone take you home. ? Plan to have someone stay with you for the first 24 hours after you leave the hospital or clinic.  For 3-6 weeks before the procedure, try not to use any tobacco products, such as cigarettes, chewing tobacco, and e-cigarettes.  You may brush your teeth on the morning of the procedure, but make sure to spit out the toothpaste. What happens during the procedure?  You will be given anesthetics through a mask and through an IV tube in one of your veins.  You may receive medicine to help you relax (sedative).  As soon as you are asleep, a breathing tube may be used to help you breathe.  An anesthesia specialist will stay with you throughout the procedure. He or she will help keep you comfortable and safe by continuing to give you medicines and adjusting the amount of medicine that you get. He or she will also watch your blood pressure, pulse, and oxygen levels to make sure that the anesthetics do not cause any problems.  If a breathing tube was used to help you breathe, it will be removed before you wake up. The procedure may vary among health care providers and hospitals. What happens after the procedure?  You will wake up, often slowly, after the procedure is complete, usually in a recovery area.  Your blood pressure, heart rate, breathing rate, and blood oxygen level will be monitored until the medicines you were given have worn off.  You may be given medicine to help you calm down if you feel anxious or agitated.  If you will be going home the same day, your  health care provider may check to make sure you can stand,  drink, and urinate.  Your health care providers will treat your pain and side effects before you go home.  Do not drive for 24 hours if you received a sedative.  You may: ? Feel nauseous and vomit. ? Have a sore throat. ? Have mental slowness. ? Feel cold or shivery. ? Feel sleepy. ? Feel tired. ? Feel sore or achy, even in parts of your body where you did not have surgery. This information is not intended to replace advice given to you by your health care provider. Make sure you discuss any questions you have with your health care provider. Document Released: 12/26/2007 Document Revised: 02/29/2016 Document Reviewed: 09/02/2015 Elsevier Interactive Patient Education  2018 [employer]. General Anesthesia, Adult, Care After These instructions provide you with information about caring for yourself after your procedure. Your health care provider may also give you more specific instructions. Your treatment has been planned according to current medical practices, but problems sometimes occur. Call your health care provider if you have any problems or questions after your procedure. What can I expect after the procedure? After the procedure, it is common to have:  Vomiting.  A sore throat.  Mental slowness.  It is common to feel:  Nauseous.  Cold or shivery.  Sleepy.  Tired.  Sore or achy, even in parts of your body where you did not have surgery.  Follow these instructions at home: For at least 24 hours after the procedure:  Do not: ? Participate in activities where you could fall or become injured. ? Drive. ? Use heavy machinery. ? Drink alcohol. ? Take sleeping pills or medicines that cause drowsiness. ? Make important decisions or sign legal documents. ? Take care of children on your own.  Rest. Eating and drinking  If you vomit, drink water, juice, or soup when you can drink without vomiting.  Drink  enough fluid to keep your urine clear or pale yellow.  Make sure you have little or no nausea before eating solid foods.  Follow the diet recommended by your health care provider. General instructions  Have a responsible adult stay with you until you are awake and alert.  Return to your normal activities as told by your health care provider. Ask your health care provider what activities are safe for you.  Take over-the-counter and prescription medicines only as told by your health care provider.  If you smoke, do not smoke without supervision.  Keep all follow-up visits as told by your health care provider. This is important. Contact a health care provider if:  You continue to have nausea or vomiting at home, and medicines are not helpful.  You cannot drink fluids or start eating again.  You cannot urinate after 8-12 hours.  You develop a skin rash.  You have fever.  You have increasing redness at the site of your procedure. Get help right away if:  You have difficulty breathing.  You have chest pain.  You have unexpected bleeding.  You feel that you are having a life-threatening or urgent problem. This information is not intended to replace advice given to you by your health care provider. Make sure you discuss any questions you have with your health care provider. Document Released: 12/25/2000 Document Revised: 02/21/2016 Document Reviewed: 09/02/2015 Elsevier Interactive Patient Education  [employer].

## 2018-05-27 NOTE — Telephone Encounter (Signed)
[facility] asking to speak with you in reference to a prior authorization for this patient.  Her number is [phone] ext# [phone]  Please call and advise

## 2018-05-27 NOTE — Telephone Encounter (Signed)
Thank you I have called and left message for Armie to call me back.

## 2018-05-27 NOTE — Telephone Encounter (Signed)
[facility] and case number is [id number] information I downloaded did not have patients dob on it, and have now faxed to to her to fax number [phone]

## 2018-05-30 ENCOUNTER — Encounter (HOSPITAL_COMMUNITY): Payer: Self-pay

## 2018-05-30 ENCOUNTER — Encounter (HOSPITAL_COMMUNITY)
Admission: RE | Admit: 2018-05-30 | Discharge: 2018-05-30 | Disposition: A | Discharge status: Home / Self Care | Payer: Medicare Other | Source: Ambulatory Visit | Attending: Orthopedic Surgery | Admitting: Orthopedic Surgery

## 2018-05-30 ENCOUNTER — Other Ambulatory Visit: Payer: Self-pay

## 2018-05-30 DIAGNOSIS — Z01810 Encounter for preprocedural cardiovascular examination: Secondary | ICD-10-CM | POA: Diagnosis not present

## 2018-05-30 DIAGNOSIS — Z01812 Encounter for preprocedural laboratory examination: Secondary | ICD-10-CM | POA: Insufficient documentation

## 2018-05-30 HISTORY — DX: Hypothyroidism, unspecified: E03.9

## 2018-05-30 LAB — BASIC METABOLIC PANEL
Anion gap: 10 (ref 5–15)
BUN: 16 mg/dL (ref 8–23)
CALCIUM: 9.1 mg/dL (ref 8.9–10.3)
CO2: 25 mmol/L (ref 22–32)
CREATININE: 0.85 mg/dL (ref 0.44–1.00)
Chloride: 94 mmol/L — ABNORMAL LOW (ref 98–111)
Glucose, Bld: 101 mg/dL — ABNORMAL HIGH (ref 70–99)
Potassium: 3.7 mmol/L (ref 3.5–5.1)
Sodium: 129 mmol/L — ABNORMAL LOW (ref 135–145)

## 2018-05-30 LAB — CBC WITH DIFFERENTIAL/PLATELET
BASOS ABS: 0 10*3/uL (ref 0.0–0.1)
BASOS PCT: 0 %
EOS ABS: 0.1 10*3/uL (ref 0.0–0.7)
Eosinophils Relative: 2 %
HCT: 34.8 % — ABNORMAL LOW (ref 36.0–46.0)
HEMOGLOBIN: 12 g/dL (ref 12.0–15.0)
Lymphocytes Relative: 33 %
Lymphs Abs: 2.4 10*3/uL (ref 0.7–4.0)
MCH: 30.4 pg (ref 26.0–34.0)
MCHC: 34.5 g/dL (ref 30.0–36.0)
MCV: 88.1 fL (ref 78.0–100.0)
MONO ABS: 0.6 10*3/uL (ref 0.1–1.0)
MONOS PCT: 8 %
NEUTROS ABS: 4.2 10*3/uL (ref 1.7–7.7)
NEUTROS PCT: 57 %
Platelets: 240 10*3/uL (ref 150–400)
RBC: 3.95 MIL/uL (ref 3.87–5.11)
RDW: 13.1 % (ref 11.5–15.5)
WBC: 7.3 10*3/uL (ref 4.0–10.5)

## 2018-05-30 LAB — SURGICAL PCR SCREEN
MRSA, PCR: NEGATIVE
STAPHYLOCOCCUS AUREUS: NEGATIVE

## 2018-05-30 LAB — PREPARE RBC (CROSSMATCH)

## 2018-05-30 NOTE — Telephone Encounter (Signed)
[id number] is the approval number for the case

## 2018-05-31 MED ORDER — TRANEXAMIC ACID 1000 MG/10ML IV SOLN
1000.0000 mg | INTRAVENOUS | Status: AC
  Administered 2018-06-04: 1000 mg via INTRAVENOUS
  Filled 2018-05-31: qty 10

## 2018-06-03 NOTE — H&P (Signed)
Chief Complaint  Patient presents with  . Knee Pain      right       84 year old female presents for evaluation of right knee after injection   She is 84 years old she is had 2 hip replacements.   She is currently ambulatory with a cane with pain lateral aspect right knee now for several months dull aching pain moderate to severe associated with painful weightbearing and difficulty with activities of daily living   Nonoperative treatment has included but was not limited to activity modification use of a cane Tylenol arthritis and cortisone injection   Patient wishes to proceed with knee replacement     Review of Systems  Musculoskeletal: Positive for joint pain.  Neurological: Negative.   All other systems reviewed and are negative.           Past Medical History:  Diagnosis Date  . Anxiety    . Arthritis    . Hypertension    . Thyroid disease             Past Surgical History:  Procedure Laterality Date  . BREAST SURGERY   1993    L BREAST BX - BENIGN  . HIP CLOSED REDUCTION Right 06/20/2015    Procedure: CLOSED REDUCTION RIGHT HIP;  Surgeon: Vickki Hearing, MD;  Location: [facility];  Service: Orthopedics;  Laterality: Right;  . TONSILLECTOMY      . TOTAL HIP ARTHROPLASTY Right 05/12/2015    Procedure: RIGHT TOTAL HIP ARTHROPLASTY;  Surgeon: Ranee Gosselin, MD;  Location: [facility];  Service: Orthopedics;  Laterality: Right;  . TOTAL HIP ARTHROPLASTY Left 08/17/2016    Procedure: TOTAL HIP ARTHROPLASTY;  Surgeon: Vickki Hearing, MD;  Location: [facility];  Service: Orthopedics;  Laterality: Left;  . TUBAL LIGATION               Family History  Problem Relation Age of Onset  . Congestive Heart Failure Mother    . Kidney failure Mother    . Stroke Father      Social History         Tobacco Use  . Smoking status: Never Smoker  . Smokeless tobacco: Never Used  Substance Use Topics  . Alcohol use: Yes      Comment: RARE  . Drug use: No           Allergies   Allergen Reactions  . Sulfa Antibiotics Other (See Comments)      "like pens and needles all over my body, tingling"--pt states she isnt sure if she is allergic to it because she was on another medication at the same time.             Current Meds  Medication Sig  . acetaminophen (TYLENOL) 500 MG tablet Take 1,000 mg by mouth every 6 (six) hours as needed for mild pain.  . clonazePAM (KLONOPIN) 0.5 MG tablet TAKE 1 2 TO 1 (ONE HALF TO ONE) TABLET BY MOUTH TWICE DAILY AS NEEDED  . levothyroxine (SYNTHROID, LEVOTHROID) 112 MCG tablet Take 112 mcg by mouth daily before breakfast.  . lisinopril-hydrochlorothiazide (PRINZIDE,ZESTORETIC) 10-12.5 MG tablet Take 1 tablet by mouth daily.  Marland Kitchen OVER THE COUNTER MEDICATION Eye drops prn burning eyes  . traZODone (DESYREL) 50 MG tablet Take 100 mg by mouth at bedtime.      BP (!) 185/82   Pulse 76   Ht 5\' 5"  (1.651 m)   Wt 155 lb (70.3 kg)   BMI 25.79  kg/m    Physical Exam  Constitutional: She is oriented to person, place, and time. She appears well-developed and well-nourished.  Musculoskeletal:       Right knee: She exhibits effusion.  Neurological: She is alert and oriented to person, place, and time.  Psychiatric: She has a normal mood and affect. Judgment normal.  Vitals reviewed.     Right Knee Exam    Muscle Strength  The patient has normal right knee strength.   Tenderness  The patient is experiencing LATERAL JOINT LINE tenderness.    Range of Motion  Extension: normal Right knee extension: 2.  Flexion:  120 normal    Tests  McMurray:  Medial - negative Lateral - negative Varus: negative Valgus: negative Drawer:  Anterior - negative    Posterior - negative   Other  Erythema: absent Scars: absent Sensation: normal Pulse: present Swelling: none Effusion: effusion present     Left Knee Exam    Muscle Strength  The patient has normal left knee strength.   Tenderness  The patient is experiencing no tenderness.     Range of Motion  Extension: normal  Flexion: normal    Tests  McMurray:  Medial - negative Lateral - negative Varus: negative Valgus: negative Drawer:  Anterior - negative     Posterior - negative   Other  Erythema: absent Scars: absent Sensation: normal Pulse: present Swelling: none   MEDICAL DECISION SECTION   CBC Latest Ref Rng & Units 05/30/2018 08/19/2016 08/18/2016  WBC 4.0 - 10.5 K/uL 7.3 13.4(H) 7.2  Hemoglobin 12.0 - 15.0 g/dL 16.1 0.9(U) 0.4(V)  Hematocrit 36.0 - 46.0 % 34.8(L) 26.5(L) 27.7(L)  Platelets 150 - 400 K/uL 240 240 210   BMP Latest Ref Rng & Units 05/30/2018 08/18/2016 08/11/2016  Glucose 70 - 99 mg/dL 409(W) 119(J) 478(G)  BUN 8 - 23 mg/dL 16 15 18   Creatinine 0.44 - 1.00 mg/dL 9.56 2.13(Y) 8.65(H)  Sodium 135 - 145 mmol/L 129(L) 131(L) 132(L)  Potassium 3.5 - 5.1 mmol/L 3.7 3.8 3.8  Chloride 98 - 111 mmol/L 94(L) 99(L) 99(L)  CO2 22 - 32 mmol/L 25 26 25   Calcium 8.9 - 10.3 mg/dL 9.1 8.4(O) 9.4    X-rays right knee      The right knee shows valgus alignment more than physiologic joint space narrowing is noted symmetric, osteopenia is noted small effusion is noted          Encounter Diagnosis  Name Primary?  . Primary osteoarthritis of right knee Yes        PLAN:    Surgical procedure planned: Right total knee   The procedure has been fully reviewed with the patient; The risks and benefits of surgery have been discussed and explained and understood. Alternative treatment has also been reviewed, questions were encouraged and answered. The postoperative plan is also been reviewed.   Nonsurgical treatment as described in the history and physical section was attempted and unsuccessful and the patient has agreed to proceed with surgical intervention to improve their situation.   No orders of the defined types were placed in this encounter.

## 2018-06-04 ENCOUNTER — Encounter (HOSPITAL_COMMUNITY): Payer: Self-pay | Admitting: Anesthesiology

## 2018-06-04 ENCOUNTER — Encounter (HOSPITAL_COMMUNITY)
Admission: RE | Disposition: A | Discharge status: Skilled Nursing Facility | Payer: Self-pay | Source: Home / Self Care | Attending: Orthopedic Surgery

## 2018-06-04 ENCOUNTER — Inpatient Hospital Stay (HOSPITAL_COMMUNITY): Payer: Medicare Other | Admitting: Anesthesiology

## 2018-06-04 ENCOUNTER — Inpatient Hospital Stay (HOSPITAL_COMMUNITY): Payer: Medicare Other

## 2018-06-04 ENCOUNTER — Inpatient Hospital Stay (HOSPITAL_COMMUNITY)
Admission: RE | Admit: 2018-06-04 | Discharge: 2018-06-07 | DRG: 470 | Disposition: A | Discharge status: Skilled Nursing Facility | Payer: Medicare Other | Attending: Orthopedic Surgery | Admitting: Orthopedic Surgery

## 2018-06-04 ENCOUNTER — Other Ambulatory Visit: Payer: Self-pay

## 2018-06-04 DIAGNOSIS — Z96643 Presence of artificial hip joint, bilateral: Secondary | ICD-10-CM | POA: Diagnosis not present

## 2018-06-04 DIAGNOSIS — Z79890 Hormone replacement therapy (postmenopausal): Secondary | ICD-10-CM | POA: Diagnosis not present

## 2018-06-04 DIAGNOSIS — Z471 Aftercare following joint replacement surgery: Secondary | ICD-10-CM | POA: Diagnosis not present

## 2018-06-04 DIAGNOSIS — M6281 Muscle weakness (generalized): Secondary | ICD-10-CM | POA: Diagnosis not present

## 2018-06-04 DIAGNOSIS — M1711 Unilateral primary osteoarthritis, right knee: Secondary | ICD-10-CM | POA: Diagnosis not present

## 2018-06-04 DIAGNOSIS — I10 Essential (primary) hypertension: Secondary | ICD-10-CM | POA: Diagnosis not present

## 2018-06-04 DIAGNOSIS — R262 Difficulty in walking, not elsewhere classified: Secondary | ICD-10-CM | POA: Diagnosis not present

## 2018-06-04 DIAGNOSIS — R2689 Other abnormalities of gait and mobility: Secondary | ICD-10-CM | POA: Diagnosis not present

## 2018-06-04 DIAGNOSIS — E039 Hypothyroidism, unspecified: Secondary | ICD-10-CM | POA: Diagnosis not present

## 2018-06-04 DIAGNOSIS — Z96641 Presence of right artificial hip joint: Secondary | ICD-10-CM | POA: Diagnosis not present

## 2018-06-04 DIAGNOSIS — Z96651 Presence of right artificial knee joint: Secondary | ICD-10-CM | POA: Diagnosis not present

## 2018-06-04 HISTORY — PX: TOTAL KNEE ARTHROPLASTY: SHX125

## 2018-06-04 SURGERY — ARTHROPLASTY, KNEE, TOTAL
Anesthesia: Spinal | Site: Knee | Laterality: Right

## 2018-06-04 MED ORDER — BUPIVACAINE LIPOSOME 1.3 % IJ SUSP
INTRAMUSCULAR | Status: AC
  Filled 2018-06-04: qty 20

## 2018-06-04 MED ORDER — TRAZODONE HCL 50 MG PO TABS
100.0000 mg | ORAL_TABLET | Freq: Every day | ORAL | Status: DC
  Administered 2018-06-04 – 2018-06-06 (×3): 100 mg via ORAL
  Filled 2018-06-04 (×3): qty 2

## 2018-06-04 MED ORDER — MEPERIDINE HCL 50 MG/ML IJ SOLN: 6.2500 mg | INTRAMUSCULAR | Status: DC | PRN

## 2018-06-04 MED ORDER — PROPOFOL 10 MG/ML IV BOLUS
INTRAVENOUS | Status: AC
  Filled 2018-06-04: qty 20

## 2018-06-04 MED ORDER — CLONAZEPAM 0.25 MG PO TBDP
0.2500 mg | ORAL_TABLET | Freq: Every day | ORAL | Status: DC
  Administered 2018-06-04 – 2018-06-06 (×3): 0.25 mg via ORAL
  Filled 2018-06-04 (×3): qty 1

## 2018-06-04 MED ORDER — PHENYLEPHRINE 40 MCG/ML (10ML) SYRINGE FOR IV PUSH (FOR BLOOD PRESSURE SUPPORT)
PREFILLED_SYRINGE | INTRAVENOUS | Status: AC
  Filled 2018-06-04: qty 10

## 2018-06-04 MED ORDER — SODIUM CHLORIDE 0.9 % IV SOLN
INTRAVENOUS | Status: AC
  Administered 2018-06-04: 13:00:00 via INTRAVENOUS

## 2018-06-04 MED ORDER — DEXAMETHASONE SODIUM PHOSPHATE 10 MG/ML IJ SOLN
10.0000 mg | Freq: Once | INTRAMUSCULAR | Status: AC
  Administered 2018-06-05: 10 mg via INTRAVENOUS
  Filled 2018-06-04: qty 1

## 2018-06-04 MED ORDER — SODIUM CHLORIDE 0.9 % IJ SOLN
INTRAMUSCULAR | Status: AC
  Filled 2018-06-04: qty 40

## 2018-06-04 MED ORDER — TRANEXAMIC ACID 1000 MG/10ML IV SOLN
1000.0000 mg | Freq: Once | INTRAVENOUS | Status: AC
  Administered 2018-06-04: 1000 mg via INTRAVENOUS
  Filled 2018-06-04: qty 10

## 2018-06-04 MED ORDER — 0.9 % SODIUM CHLORIDE (POUR BTL) OPTIME
TOPICAL | Status: DC | PRN
  Administered 2018-06-04: 1000 mL

## 2018-06-04 MED ORDER — PHENOL 1.4 % MT LIQD: 1.0000 | OROMUCOSAL | Status: DC | PRN

## 2018-06-04 MED ORDER — LOSARTAN POTASSIUM 50 MG PO TABS
100.0000 mg | ORAL_TABLET | Freq: Every day | ORAL | Status: DC
  Administered 2018-06-06: 100 mg via ORAL
  Filled 2018-06-04 (×3): qty 2

## 2018-06-04 MED ORDER — HYDROCODONE-ACETAMINOPHEN 5-325 MG PO TABS: 1.0000 | ORAL_TABLET | ORAL | Status: DC | PRN

## 2018-06-04 MED ORDER — CHLORHEXIDINE GLUCONATE 4 % EX LIQD: 60.0000 mL | Freq: Once | CUTANEOUS | Status: DC

## 2018-06-04 MED ORDER — CEFAZOLIN SODIUM-DEXTROSE 1-4 GM/50ML-% IV SOLN
1.0000 g | Freq: Four times a day (QID) | INTRAVENOUS | Status: AC
  Administered 2018-06-04 (×2): 1 g via INTRAVENOUS
  Filled 2018-06-04 (×2): qty 50

## 2018-06-04 MED ORDER — NAPROXEN 250 MG PO TABS
250.0000 mg | ORAL_TABLET | Freq: Two times a day (BID) | ORAL | Status: DC
  Administered 2018-06-04 – 2018-06-07 (×7): 250 mg via ORAL
  Filled 2018-06-04 (×7): qty 1

## 2018-06-04 MED ORDER — PROMETHAZINE HCL 25 MG/ML IJ SOLN: 6.2500 mg | INTRAMUSCULAR | Status: DC | PRN

## 2018-06-04 MED ORDER — HYDROMORPHONE HCL 1 MG/ML IJ SOLN: 0.2500 mg | INTRAMUSCULAR | Status: DC | PRN

## 2018-06-04 MED ORDER — LOSARTAN POTASSIUM-HCTZ 100-25 MG PO TABS: 1.0000 | ORAL_TABLET | Freq: Every day | ORAL | Status: DC

## 2018-06-04 MED ORDER — PROPOFOL 500 MG/50ML IV EMUL
INTRAVENOUS | Status: DC | PRN
  Administered 2018-06-04: 25 ug/kg/min via INTRAVENOUS

## 2018-06-04 MED ORDER — FENTANYL CITRATE (PF) 100 MCG/2ML IJ SOLN
INTRAMUSCULAR | Status: AC
  Filled 2018-06-04: qty 2

## 2018-06-04 MED ORDER — ONDANSETRON HCL 4 MG/2ML IJ SOLN
INTRAMUSCULAR | Status: DC | PRN
  Administered 2018-06-04: 4 mg via INTRAVENOUS

## 2018-06-04 MED ORDER — DOCUSATE SODIUM 100 MG PO CAPS
100.0000 mg | ORAL_CAPSULE | Freq: Two times a day (BID) | ORAL | Status: DC
  Administered 2018-06-04 – 2018-06-07 (×7): 100 mg via ORAL
  Filled 2018-06-04 (×7): qty 1

## 2018-06-04 MED ORDER — CEFAZOLIN SODIUM-DEXTROSE 2-4 GM/100ML-% IV SOLN
2.0000 g | INTRAVENOUS | Status: AC
  Administered 2018-06-04: 2 g via INTRAVENOUS

## 2018-06-04 MED ORDER — MENTHOL 3 MG MT LOZG: 1.0000 | LOZENGE | OROMUCOSAL | Status: DC | PRN

## 2018-06-04 MED ORDER — BUPIVACAINE-EPINEPHRINE (PF) 0.5% -1:200000 IJ SOLN
INTRAMUSCULAR | Status: DC | PRN
  Administered 2018-06-04: 20 mL via PERINEURAL

## 2018-06-04 MED ORDER — HYDROCODONE-ACETAMINOPHEN 7.5-325 MG PO TABS: 1.0000 | ORAL_TABLET | Freq: Once | ORAL | Status: DC | PRN

## 2018-06-04 MED ORDER — ONDANSETRON HCL 4 MG/2ML IJ SOLN: 4.0000 mg | Freq: Four times a day (QID) | INTRAMUSCULAR | Status: DC | PRN

## 2018-06-04 MED ORDER — ASPIRIN EC 325 MG PO TBEC
325.0000 mg | DELAYED_RELEASE_TABLET | Freq: Every day | ORAL | Status: DC
  Administered 2018-06-05 – 2018-06-07 (×3): 325 mg via ORAL
  Filled 2018-06-04 (×3): qty 1

## 2018-06-04 MED ORDER — SODIUM CHLORIDE 0.9 % IR SOLN
Status: DC | PRN
  Administered 2018-06-04: 3000 mL

## 2018-06-04 MED ORDER — MORPHINE SULFATE (PF) 2 MG/ML IV SOLN: 0.5000 mg | INTRAVENOUS | Status: DC | PRN

## 2018-06-04 MED ORDER — BUPIVACAINE-EPINEPHRINE (PF) 0.5% -1:200000 IJ SOLN
INTRAMUSCULAR | Status: AC
  Filled 2018-06-04: qty 30

## 2018-06-04 MED ORDER — BUPIVACAINE IN DEXTROSE 0.75-8.25 % IT SOLN
INTRATHECAL | Status: DC | PRN
  Administered 2018-06-04: 12.5 mg via INTRATHECAL

## 2018-06-04 MED ORDER — DEXAMETHASONE SODIUM PHOSPHATE 4 MG/ML IJ SOLN
INTRAMUSCULAR | Status: DC | PRN
  Administered 2018-06-04: 4 mg via INTRAVENOUS

## 2018-06-04 MED ORDER — METOCLOPRAMIDE HCL 10 MG PO TABS: 5.0000 mg | ORAL_TABLET | Freq: Three times a day (TID) | ORAL | Status: DC | PRN

## 2018-06-04 MED ORDER — ONDANSETRON HCL 4 MG PO TABS: 4.0000 mg | ORAL_TABLET | Freq: Four times a day (QID) | ORAL | Status: DC | PRN

## 2018-06-04 MED ORDER — HYDROCHLOROTHIAZIDE 25 MG PO TABS
25.0000 mg | ORAL_TABLET | Freq: Every day | ORAL | Status: DC
  Administered 2018-06-05: 25 mg via ORAL
  Filled 2018-06-04 (×3): qty 1

## 2018-06-04 MED ORDER — FENTANYL CITRATE (PF) 100 MCG/2ML IJ SOLN
INTRAMUSCULAR | Status: DC | PRN
  Administered 2018-06-04: 25 ug via INTRAVENOUS
  Administered 2018-06-04: 20 ug via INTRATHECAL
  Administered 2018-06-04: 30 ug via INTRAVENOUS
  Administered 2018-06-04: 25 ug via INTRAVENOUS

## 2018-06-04 MED ORDER — MIDAZOLAM HCL 2 MG/2ML IJ SOLN
INTRAMUSCULAR | Status: AC
  Filled 2018-06-04: qty 2

## 2018-06-04 MED ORDER — LACTATED RINGERS IV SOLN: INTRAVENOUS | Status: DC

## 2018-06-04 MED ORDER — TRAMADOL HCL 50 MG PO TABS
50.0000 mg | ORAL_TABLET | Freq: Four times a day (QID) | ORAL | Status: DC
  Administered 2018-06-04 – 2018-06-07 (×13): 50 mg via ORAL
  Filled 2018-06-04 (×14): qty 1

## 2018-06-04 MED ORDER — METOCLOPRAMIDE HCL 5 MG/ML IJ SOLN: 5.0000 mg | Freq: Three times a day (TID) | INTRAMUSCULAR | Status: DC | PRN

## 2018-06-04 MED ORDER — ALUM & MAG HYDROXIDE-SIMETH 200-200-20 MG/5ML PO SUSP: 30.0000 mL | ORAL | Status: DC | PRN

## 2018-06-04 MED ORDER — ACETAMINOPHEN 500 MG PO TABS
500.0000 mg | ORAL_TABLET | Freq: Four times a day (QID) | ORAL | Status: AC
  Administered 2018-06-04 – 2018-06-05 (×4): 500 mg via ORAL
  Filled 2018-06-04 (×4): qty 1

## 2018-06-04 MED ORDER — CEFAZOLIN SODIUM-DEXTROSE 2-4 GM/100ML-% IV SOLN
INTRAVENOUS | Status: AC
  Filled 2018-06-04: qty 100

## 2018-06-04 MED ORDER — MIDAZOLAM HCL 5 MG/5ML IJ SOLN
INTRAMUSCULAR | Status: DC | PRN
  Administered 2018-06-04 (×4): 0.5 mg via INTRAVENOUS

## 2018-06-04 MED ORDER — SODIUM CHLORIDE 0.9 % IV SOLN
INTRAVENOUS | Status: DC | PRN
  Administered 2018-06-04: 60 mL

## 2018-06-04 MED ORDER — LEVOTHYROXINE SODIUM 112 MCG PO TABS
112.0000 ug | ORAL_TABLET | Freq: Every day | ORAL | Status: DC
  Administered 2018-06-05 – 2018-06-07 (×3): 112 ug via ORAL
  Filled 2018-06-04 (×3): qty 1

## 2018-06-04 MED ORDER — PHENYLEPHRINE HCL 10 MG/ML IJ SOLN
INTRAMUSCULAR | Status: AC
  Filled 2018-06-04: qty 1

## 2018-06-04 MED ORDER — LACTATED RINGERS IV SOLN
INTRAVENOUS | Status: DC
  Administered 2018-06-04 (×2): via INTRAVENOUS

## 2018-06-04 SURGICAL SUPPLY — 69 items
BANDAGE ELASTIC 4 LF NS (GAUZE/BANDAGES/DRESSINGS) ×6 IMPLANT
BANDAGE ELASTIC 6 LF NS (GAUZE/BANDAGES/DRESSINGS) ×3 IMPLANT
BANDAGE ESMARK 6X9 LF (GAUZE/BANDAGES/DRESSINGS) ×1 IMPLANT
BLADE HEX COATED 2.75 (ELECTRODE) ×3 IMPLANT
BLADE SAGITTAL 25.0X1.27X90 (BLADE) ×2 IMPLANT
BLADE SAGITTAL 25.0X1.27X90MM (BLADE) ×1
BNDG ESMARK 6X9 LF (GAUZE/BANDAGES/DRESSINGS) ×3
CEMENT HV SMART SET (Cement) ×6 IMPLANT
CLOTH BEACON ORANGE TIMEOUT ST (SAFETY) ×3 IMPLANT
COOLER CRYO CUFF IC AND MOTOR (MISCELLANEOUS) ×3 IMPLANT
COVER LIGHT HANDLE STERIS (MISCELLANEOUS) ×6 IMPLANT
CUFF CRYO KNEE18X23 MED (MISCELLANEOUS) ×3 IMPLANT
CUFF TOURNIQUET SINGLE 34IN LL (TOURNIQUET CUFF) ×3 IMPLANT
DECANTER SPIKE VIAL GLASS SM (MISCELLANEOUS) ×6 IMPLANT
DRAPE BACK TABLE (DRAPES) ×3 IMPLANT
DRAPE EXTREMITY T 121X128X90 (DRAPE) ×3 IMPLANT
DRESSING AQUACEL AG ADV 3.5X12 (MISCELLANEOUS) ×1 IMPLANT
DRSG AQUACEL AG ADV 3.5X12 (MISCELLANEOUS) ×3
DRSG MEPILEX BORDER 4X12 (GAUZE/BANDAGES/DRESSINGS) ×3 IMPLANT
DURAPREP 26ML APPLICATOR (WOUND CARE) ×6 IMPLANT
ELECT REM PT RETURN 9FT ADLT (ELECTROSURGICAL) ×3
ELECTRODE REM PT RTRN 9FT ADLT (ELECTROSURGICAL) ×1 IMPLANT
EVACUATOR 3/16  PVC DRAIN (DRAIN) ×2
EVACUATOR 3/16 PVC DRAIN (DRAIN) ×1 IMPLANT
FEMUR RIGHT SZ 3 (Knees) ×3 IMPLANT
GLOVE BIO SURGEON STRL SZ7 (GLOVE) ×6 IMPLANT
GLOVE BIOGEL PI IND STRL 7.0 (GLOVE) ×4 IMPLANT
GLOVE BIOGEL PI INDICATOR 7.0 (GLOVE) ×8
GLOVE ECLIPSE 6.5 STRL STRAW (GLOVE) ×3 IMPLANT
GLOVE SKINSENSE NS SZ8.0 LF (GLOVE) ×4
GLOVE SKINSENSE STRL SZ8.0 LF (GLOVE) ×2 IMPLANT
GLOVE SS N UNI LF 8.5 STRL (GLOVE) ×3 IMPLANT
GOWN STRL REUS W/ TWL LRG LVL3 (GOWN DISPOSABLE) ×1 IMPLANT
GOWN STRL REUS W/TWL LRG LVL3 (GOWN DISPOSABLE) ×11 IMPLANT
GOWN STRL REUS W/TWL XL LVL3 (GOWN DISPOSABLE) ×3 IMPLANT
HANDPIECE INTERPULSE COAX TIP (DISPOSABLE) ×2
HOOD W/PEELAWAY (MISCELLANEOUS) ×15 IMPLANT
INSERT STABILIZED 10MM (Knees) ×3 IMPLANT
INST SET MAJOR BONE (KITS) ×3 IMPLANT
IV NS IRRIG 3000ML ARTHROMATIC (IV SOLUTION) ×3 IMPLANT
KIT BLADEGUARD II DBL (SET/KITS/TRAYS/PACK) ×3 IMPLANT
KIT TURNOVER CYSTO (KITS) ×3 IMPLANT
MANIFOLD NEPTUNE II (INSTRUMENTS) ×3 IMPLANT
MARKER SKIN DUAL TIP RULER LAB (MISCELLANEOUS) ×3 IMPLANT
NEEDLE HYPO 21X1.5 SAFETY (NEEDLE) ×3 IMPLANT
NS IRRIG 1000ML POUR BTL (IV SOLUTION) ×3 IMPLANT
PACK TOTAL JOINT (CUSTOM PROCEDURE TRAY) ×3 IMPLANT
PAD ARMBOARD 7.5X6 YLW CONV (MISCELLANEOUS) ×3 IMPLANT
PAD DANNIFLEX CPM (ORTHOPEDIC SUPPLIES) ×3 IMPLANT
PATELLA DOME PFC 35MM (Knees) ×3 IMPLANT
PILLOW KNEE EXTENSION 0 DEG (MISCELLANEOUS) ×3 IMPLANT
PIN TROCAR 3 INCH (PIN) IMPLANT
PIN/DRILL PACK ORTHO 1/8X3.0 (PIN) ×3 IMPLANT
SAW OSC TIP CART 19.5X105X1.3 (SAW) ×3 IMPLANT
SET BASIN LINEN APH (SET/KITS/TRAYS/PACK) ×3 IMPLANT
SET HNDPC FAN SPRY TIP SCT (DISPOSABLE) ×1 IMPLANT
STAPLER VISISTAT 35W (STAPLE) ×3 IMPLANT
SUT BRALON NAB BRD #1 30IN (SUTURE) ×6 IMPLANT
SUT MNCRL 0 VIOLET CTX 36 (SUTURE) ×1 IMPLANT
SUT MON AB 0 CT1 (SUTURE) ×3 IMPLANT
SUT MON AB 2-0 CT1 36 (SUTURE) IMPLANT
SUT MONOCRYL 0 CTX 36 (SUTURE) ×2
SYRINGE 20CC LL (MISCELLANEOUS) ×15 IMPLANT
TOWEL OR 17X26 4PK STRL BLUE (TOWEL DISPOSABLE) ×3 IMPLANT
TOWER CARTRIDGE SMART MIX (DISPOSABLE) ×3 IMPLANT
TRAY FOLEY MTR SLVR 16FR STAT (SET/KITS/TRAYS/PACK) ×3 IMPLANT
TRAY TIBIAL SZ3 COBALT 71X47MM (Knees) ×3 IMPLANT
WATER STERILE IRR 1000ML POUR (IV SOLUTION) ×6 IMPLANT
YANKAUER SUCT 12FT TUBE ARGYLE (SUCTIONS) ×3 IMPLANT

## 2018-06-04 NOTE — Transfer of Care (Signed)
Immediate Anesthesia Transfer of Care Note  Patient: [name]  Procedure(s) Performed: TOTAL KNEE ARTHROPLASTY (Right Knee)  Patient Location: PACU  Anesthesia Type:Spinal  Level of Consciousness: awake, alert , oriented and patient cooperative  Airway & Oxygen Therapy: Patient Spontanous Breathing and Patient connected to face mask  Post-op Assessment: Report given to RN and Post -op Vital signs reviewed and stable  Post vital signs: Reviewed and stable  Last Vitals:  Vitals Value Taken Time  BP 116/54 06/04/2018 12:18 PM  Temp    Pulse 73 06/04/2018 12:20 PM  Resp 8 06/04/2018 12:20 PM  SpO2 94 % 06/04/2018 12:20 PM  Vitals shown include unvalidated device data.  Last Pain:  Vitals:   06/04/18 0846  TempSrc: Oral  PainSc: 2          Complications: No apparent anesthesia complications

## 2018-06-04 NOTE — Anesthesia Postprocedure Evaluation (Signed)
Anesthesia Post Note  Patient: [name]  Procedure(s) Performed: TOTAL KNEE ARTHROPLASTY (Right Knee)  Patient location during evaluation: PACU Anesthesia Type: Spinal Level of consciousness: awake and alert and patient cooperative Pain management: satisfactory to patient Vital Signs Assessment: post-procedure vital signs reviewed and stable Respiratory status: spontaneous breathing and patient connected to nasal cannula oxygen Postop Assessment: no apparent nausea or vomiting and spinal receding Anesthetic complications: no     Last Vitals:  Vitals:   06/04/18 1245 06/04/18 1300  BP: 90/74 140/84  Pulse: 68 70  Resp: 18 14  Temp:    SpO2: 97% 96%    Last Pain:  Vitals:   06/04/18 1245  TempSrc:   PainSc: 0-No pain                 Margaretta Chittum

## 2018-06-04 NOTE — Anesthesia Preprocedure Evaluation (Signed)
Anesthesia Evaluation  Patient identified by MRN, date of birth, ID band Patient awake    Reviewed: Allergy & Precautions, H&P , NPO status , Patient's Chart, lab work & pertinent test results, reviewed documented beta blocker date and time   Airway Mallampati: II  TM Distance: >3 FB Neck ROM: full    Dental no notable dental hx. (+) Dental Advidsory Given   Pulmonary neg pulmonary ROS,    Pulmonary exam normal breath sounds clear to auscultation       Cardiovascular Exercise Tolerance: Good hypertension, On Medications negative cardio ROS   Rhythm:regular Rate:Normal     Neuro/Psych Anxiety negative neurological ROS  negative psych ROS   GI/Hepatic negative GI ROS, Neg liver ROS,   Endo/Other  negative endocrine ROSHypothyroidism   Renal/GU negative Renal ROS  negative genitourinary   Musculoskeletal   Abdominal   Peds  Hematology negative hematology ROS (+)   Anesthesia Other Findings NSR with PAC at 78 Prefers spinal to GA/ETT Daughter reports mental changes and "foggy" for "weeks" after ost recent THA under GA  Reproductive/Obstetrics negative OB ROS                             Anesthesia Physical Anesthesia Plan  ASA: III  Anesthesia Plan: Spinal   Post-op Pain Management:    Induction:   PONV Risk Score and Plan:   Airway Management Planned:   Additional Equipment:   Intra-op Plan:   Post-operative Plan:   Informed Consent: I have reviewed the patients History and Physical, chart, labs and discussed the procedure including the risks, benefits and alternatives for the proposed anesthesia with the patient or authorized representative who has indicated his/her understanding and acceptance.   Dental Advisory Given  Plan Discussed with: CRNA and Anesthesiologist  Anesthesia Plan Comments:         Anesthesia Quick Evaluation

## 2018-06-04 NOTE — Anesthesia Procedure Notes (Signed)
Spinal  Patient location during procedure: OR Start time: 06/04/2018 10:22 AM End time: 06/04/2018 10:30 AM Staffing Anesthesiologist: Arbie Cookey, MD Spinal Block Patient position: sitting Prep: Betadine Patient monitoring: heart rate, cardiac monitor, continuous pulse ox and blood pressure Approach: left paramedian Location: L3-4 Injection technique: single-shot Needle Needle type: Pencan  Needle gauge: 24 G Needle length: 9 cm Needle insertion depth: 5 cm Assessment Sensory level: T8

## 2018-06-04 NOTE — Interval H&P Note (Signed)
History and Physical Interval Note:  06/04/2018 9:59 AM  [name]  has presented today for surgery, with the diagnosis of osteoarthritis right knee  The various methods of treatment have been discussed with the patient and family. After consideration of risks, benefits and other options for treatment, the patient has consented to  Procedure(s): TOTAL KNEE ARTHROPLASTY (Right) as a surgical intervention .  The patient's history has been reviewed, patient examined, no change in status, stable for surgery.  I have reviewed the patient's chart and labs.  Questions were answered to the patient's satisfaction.     Fuller Canada

## 2018-06-04 NOTE — Anesthesia Procedure Notes (Signed)
Procedure Name: [place] Performed by: Alvy Bimler, CRNA Pre-anesthesia Checklist: Patient identified, Emergency Drugs available, Suction available, Patient being monitored and Timeout performed Oxygen Delivery Method: Nasal cannula

## 2018-06-04 NOTE — Interval H&P Note (Signed)
History and Physical Interval Note:  06/04/2018 9:59 AM  [name]  has presented today for surgery, with the diagnosis of osteoarthritis right knee  The various methods of treatment have been discussed with the patient and family. After consideration of risks, benefits and other options for treatment, the patient has consented to  Procedure(s): TOTAL KNEE ARTHROPLASTY (Right) as a surgical intervention .  The patient's history has been reviewed, patient examined, no change in status, stable for surgery.  I have reviewed the patient's chart and labs.  Questions were answered to the patient's satisfaction.     [name]   

## 2018-06-04 NOTE — Op Note (Signed)
06/04/2018  12:13 PM  PATIENT:  [name]  84 y.o. female  PRE-OPERATIVE DIAGNOSIS:  osteoarthritis right knee  POST-OPERATIVE DIAGNOSIS:  osteoarthritis right knee  PROCEDURE:  Procedure(s): TOTAL KNEE ARTHROPLASTY (Right) 27447  IMPLANTS: DEPUY Sigma fixed bearing posterior stabilized 3 femur 3 tibia 10 polyethylene PS insert 35 x 9.5 Resections 11 mm distal femur, 6 mm proximal lateral tibia, 10 mm from posterior patella  Findings valgus knee mild valgus deformity no posterior condylar deficiency osteoarthritis distal femur grade 4 proximal tibia grade 3 grade 1 patella medial compartment grade 1 ACL PCL intact lateral meniscus intact medial meniscus intact  SURGEON:  Surgeon(s) and Role:    Vickki Hearing, MD - Primary  Surgeon Romeo Apple [phone]   Details of procedure  Assisted by B[facility] and [facility]  Anesthetic spinal  Drains: No   Exparel 20MG  DILUTED W/ 40 CC SALINE   Marcaine with epinephrine   The patient was identified in the preop holding area and the surgical site was confirmed as the right knee. Chart review and update were completed. The patient was taken to the operating room for spinal anesthesia. After successful spinal anesthesia Foley catheter was inserted. The patient was placed supine on the operating table.   the right leg was prepped with DuraPrep and draped sterilely. Timeout was completed. The limb was then exsanguinated a  6 inch Esmarch. The tourniquet was elevated to 300 mmHg.   A midline incision was made and taken down to the extensor mechanism followed by medial arthrotomy. The patella was everted. A synovectomy was performed as needed. The osteophytes were resected.  Anterior cruciate ligament and PCL and medial and lateral meniscus were resected.   a 3/8 inch drill bit was used to enter the femoral canal which was suctioned and irrigated until the fluid was clear. The distal femoral cut was set for 11  millimeter resection with a 5  Right Valgus angle. This cut was completed and checked for flatness.   the femur was then measured to a size 3.    the tibia was subluxated forward and the external alignment guide was placed. We removed 6 mm of bone from the higher lateral side. We set the guide for neutral varus valgus cut related to the  Mechanical axis of the tibia and for slope matching the patient's anatomy. Rotational alignment was set using the tibial tubercle, tibial spine and second metatarsal. The cutting block was pinned and the proximal tibia was resected.   A spacer block was placed starting with a 10 mm insert to check the extension gap.  This was stable in extension and full extension was obtained   The 4-in-1 femoral cutting block was placed to match the femoral epicondyles and the 4 distal cuts were made.   A spacer block was placed starting with a 10 mm insert to check the flexion and extension gap. The 10 spacer block confirmed proper balance in flexion and extension.   We placed the femoral notch cutting guide size 3 and resected the notch.   Trial implants were placed using appropriate size femur , appropriate size tibial baseplate which was measured after the proximal tibia resection. Tibial rotation was set patella tracking was normal   The tibia was then punched per manufacture technique making sure to avoid internal rotation.   The patella measured a size 22 mm thickness   we resected down to a size 12 mm using a size 9.5 thickness by 35 button. Total  patella thickness was 21.5 after reconstruction   Final range of motion check was performed with the appropriate size trials as mentioned above. Satisfactory reduction and motion were obtained.   Trial implants were removed. The bone was irrigated and dried and the cement was mixed on the back table  exparel was injected in the soft tissues and posterior capsule of the knee  These implants were then cemented in place.  Excess cement was removed. The cement was allowed to cure. Second irrigation was performed.    FInal range of motion check and stability check was completed  The wound was irrigated t a third time ,Hemovac drain was placed, extensor mechanism was closed with #1 Nurolon followed by 0 Monocryl and staples to reapproximate the skin edges and subcutaneous tissue.   Sterile dressing and cryocuff  was applied  The patient was taken recovery in stable condition    EBL:  5 mL   BLOOD ADMINISTERED:none  DRAINS: none   SPECIMEN:  No Specimen  DISPOSITION OF SPECIMEN:  PATHOLOGY  COUNTS:  YES  TOURNIQUET:   Total Tourniquet Time Documented: Thigh (Right) - 71 minutes Total: Thigh (Right) - 71 minutes   DICTATION: .[name] Dictation  PLAN OF CARE: Admit to inpatient   PATIENT DISPOSITION:  PACU - hemodynamically stable.   Delay start of Pharmacological VTE agent (>24hrs) due to surgical blood loss or risk of bleeding: yes

## 2018-06-04 NOTE — Anesthesia Postprocedure Evaluation (Signed)
Anesthesia Post Note  Patient: [name]  Procedure(s) Performed: TOTAL KNEE ARTHROPLASTY (Right Knee)  Patient location during evaluation: PACU Anesthesia Type: Spinal Level of consciousness: oriented and awake and alert Pain management: pain level controlled Vital Signs Assessment: post-procedure vital signs reviewed and stable Respiratory status: spontaneous breathing, respiratory function stable and patient connected to nasal cannula oxygen Cardiovascular status: blood pressure returned to baseline and stable Postop Assessment: no headache, no backache and no apparent nausea or vomiting Anesthetic complications: no     Last Vitals:  Vitals:   06/04/18 0846 06/04/18 1218  BP: (!) 203/96 (!) 116/54  Pulse: 80 72  Resp: (!) 21 17  Temp: 36.6 C 36.5 C  SpO2: 98% 95%    Last Pain:  Vitals:   06/04/18 1218  TempSrc:   PainSc: 0-No pain                 Harbor Paster

## 2018-06-04 NOTE — Brief Op Note (Signed)
06/04/2018  12:13 PM  PATIENT:  [name]  85 y.o. female  PRE-OPERATIVE DIAGNOSIS:  osteoarthritis right knee  POST-OPERATIVE DIAGNOSIS:  osteoarthritis right knee  PROCEDURE:  Procedure(s): TOTAL KNEE ARTHROPLASTY (Right) 27447  IMPLANTS: DEPUY Sigma fixed bearing posterior stabilized 3 femur 3 tibia 10 polyethylene PS insert 35 x 9.5 Resections 11 mm distal femur, 6 mm proximal lateral tibia, 10 mm from posterior patella  Findings valgus knee mild valgus deformity no posterior condylar deficiency osteoarthritis distal femur grade 4 proximal tibia grade 3 grade 1 patella medial compartment grade 1 ACL PCL intact lateral meniscus intact medial meniscus intact  SURGEON:  Surgeon(s) and Role:    * Journii Nierman E, MD - Primary  Surgeon Horald Birky [phone]   Details of procedure  Assisted by Betty Ashley and Debbie Dallas  Anesthetic spinal  Drains: No   Exparel 20MG DILUTED W/ 40 CC SALINE   Marcaine with epinephrine   The patient was identified in the preop holding area and the surgical site was confirmed as the right knee. Chart review and update were completed. The patient was taken to the operating room for spinal anesthesia. After successful spinal anesthesia Foley catheter was inserted. The patient was placed supine on the operating table.   the right leg was prepped with DuraPrep and draped sterilely. Timeout was completed. The limb was then exsanguinated a  6 inch Esmarch. The tourniquet was elevated to 300 mmHg.   A midline incision was made and taken down to the extensor mechanism followed by medial arthrotomy. The patella was everted. A synovectomy was performed as needed. The osteophytes were resected.  Anterior cruciate ligament and PCL and medial and lateral meniscus were resected.   a 3/8 inch drill bit was used to enter the femoral canal which was suctioned and irrigated until the fluid was clear. The distal femoral cut was set for 11  millimeter resection with a 5  Right Valgus angle. This cut was completed and checked for flatness.   the femur was then measured to a size 3.    the tibia was subluxated forward and the external alignment guide was placed. We removed 6 mm of bone from the higher lateral side. We set the guide for neutral varus valgus cut related to the  Mechanical axis of the tibia and for slope matching the patient's anatomy. Rotational alignment was set using the tibial tubercle, tibial spine and second metatarsal. The cutting block was pinned and the proximal tibia was resected.   A spacer block was placed starting with a 10 mm insert to check the extension gap.  This was stable in extension and full extension was obtained   The 4-in-1 femoral cutting block was placed to match the femoral epicondyles and the 4 distal cuts were made.   A spacer block was placed starting with a 10 mm insert to check the flexion and extension gap. The 10 spacer block confirmed proper balance in flexion and extension.   We placed the femoral notch cutting guide size 3 and resected the notch.   Trial implants were placed using appropriate size femur , appropriate size tibial baseplate which was measured after the proximal tibia resection. Tibial rotation was set patella tracking was normal   The tibia was then punched per manufacture technique making sure to avoid internal rotation.   The patella measured a size 22 mm thickness   we resected down to a size 12 mm using a size 9.5 thickness by 35 button. Total   patella thickness was 21.5 after reconstruction   Final range of motion check was performed with the appropriate size trials as mentioned above. Satisfactory reduction and motion were obtained.   Trial implants were removed. The bone was irrigated and dried and the cement was mixed on the back table  exparel was injected in the soft tissues and posterior capsule of the knee  These implants were then cemented in place.  Excess cement was removed. The cement was allowed to cure. Second irrigation was performed.    FInal range of motion check and stability check was completed  The wound was irrigated t a third time ,Hemovac drain was placed, extensor mechanism was closed with #1 Nurolon followed by 0 Monocryl and staples to reapproximate the skin edges and subcutaneous tissue.   Sterile dressing and cryocuff  was applied  The patient was taken recovery in stable condition    EBL:  5 mL   BLOOD ADMINISTERED:none  DRAINS: none   SPECIMEN:  No Specimen  DISPOSITION OF SPECIMEN:  PATHOLOGY  COUNTS:  YES  TOURNIQUET:   Total Tourniquet Time Documented: Thigh (Right) - 71 minutes Total: Thigh (Right) - 71 minutes   DICTATION: .Dragon Dictation  PLAN OF CARE: Admit to inpatient   PATIENT DISPOSITION:  PACU - hemodynamically stable.   Delay start of Pharmacological VTE agent (>24hrs) due to surgical blood loss or risk of bleeding: yes  

## 2018-06-05 ENCOUNTER — Encounter (HOSPITAL_COMMUNITY): Payer: Self-pay | Admitting: Orthopedic Surgery

## 2018-06-05 LAB — BASIC METABOLIC PANEL
Anion gap: 8 (ref 5–15)
BUN: 19 mg/dL (ref 8–23)
CO2: 26 mmol/L (ref 22–32)
CREATININE: 1.08 mg/dL — AB (ref 0.44–1.00)
Calcium: 8.7 mg/dL — ABNORMAL LOW (ref 8.9–10.3)
Chloride: 98 mmol/L (ref 98–111)
GFR calc Af Amer: 53 mL/min — ABNORMAL LOW (ref >60)
GFR, EST NON AFRICAN AMERICAN: 46 mL/min — AB (ref >60)
GLUCOSE: 106 mg/dL — AB (ref 70–99)
Potassium: 3.8 mmol/L (ref 3.5–5.1)
Sodium: 132 mmol/L — ABNORMAL LOW (ref 135–145)

## 2018-06-05 LAB — CBC
HCT: 30.6 % — ABNORMAL LOW (ref 36.0–46.0)
Hemoglobin: 10.4 g/dL — ABNORMAL LOW (ref 12.0–15.0)
MCH: 30.1 pg (ref 26.0–34.0)
MCHC: 34 g/dL (ref 30.0–36.0)
MCV: 88.4 fL (ref 78.0–100.0)
PLATELETS: 218 10*3/uL (ref 150–400)
RBC: 3.46 MIL/uL — ABNORMAL LOW (ref 3.87–5.11)
RDW: 13.1 % (ref 11.5–15.5)
WBC: 8.7 10*3/uL (ref 4.0–10.5)

## 2018-06-05 NOTE — Clinical Social Work Note (Signed)
Clinical Social Work Assessment  Patient Details  Name: [name] MRN: [id number] Date of Birth: 1932-12-10  Date of referral:  06/05/18               Reason for consult:  Facility Placement                Permission sought to share information with:    Permission granted to share information::     Name::        Agency::     Relationship::     Contact Information:     Housing/Transportation Living arrangements for the past 2 months:  Single Family Home Source of Information:  Patient Patient Interpreter Needed:  None Criminal Activity/Legal Involvement Pertinent to Current Situation/Hospitalization:  No - Comment as needed Significant Relationships:  Siblings, Other Family Members Lives with:  Self Do you feel safe going back to the place where you live?  Yes Need for family participation in patient care:  Yes (Comment)  Care giving concerns:  None identified. Patient is independent at baseline.    Social Worker assessment / plan:  Patient ambulates independently at baseline but uses a cane when she is out in the community. She drives and is independent in ADLs.  Patient states that she was under the impression that her rehab had been arranged by her surgeon's office.  LCSW discussed that a function of LCSW's role is rehab.  LCSW discussed insurance authorization.  Patient only wants to go to [facility]. Patient states that if insurance does not authorize and if [facility] is unable to accept her she will go home and work something out with family.   Referral sent to [facility].   Employment status:  Retired Database administrator PT Recommendations:  Skilled Nursing Facility Information / Referral to community resources:  Skilled Nursing Facility  Patient/Family's Response to care:  Patient is agreeable to short term rehab if her insurance will authorize.   Patient/Family's Understanding of and Emotional Response to Diagnosis, Current Treatment, and Prognosis:  Patient  understands her diagnosis, treatment and prognosis.   Emotional Assessment Appearance:  Appears stated age Attitude/Demeanor/Rapport:    Affect (typically observed):  Accepting, Calm Orientation:  Oriented to Self, Oriented to Place, Oriented to  Time, Oriented to Situation Alcohol / Substance use:  Not Applicable Psych involvement (Current and /or in the community):  No (Comment)  Discharge Needs  Concerns to be addressed:  Discharge Planning Concerns Readmission within the last 30 days:  No Current discharge risk:  None Barriers to Discharge:  Insurance Authorization   Annice Needy, LCSW 06/05/2018, 12:07 PM

## 2018-06-05 NOTE — Anesthesia Postprocedure Evaluation (Signed)
Anesthesia Post Note  Patient: [name]  Procedure(s) Performed: TOTAL KNEE ARTHROPLASTY (Right Knee)  Patient location during evaluation: Nursing Unit Anesthesia Type: Spinal Level of consciousness: awake and alert and oriented Pain management: pain level controlled Vital Signs Assessment: post-procedure vital signs reviewed and stable Respiratory status: spontaneous breathing Cardiovascular status: stable Anesthetic complications: no     Last Vitals:  Vitals:   06/05/18 0433 06/05/18 0537  BP: 131/70 117/72  Pulse: 74 68  Resp: 18 18  Temp: (!) 36.4 C (!) 36.3 C  SpO2: 96% 97%    Last Pain:  Vitals:   06/05/18 0707  TempSrc:   PainSc: Asleep                 Christopher Glasscock A

## 2018-06-05 NOTE — Progress Notes (Signed)
Physical Therapy Evaluation Patient Details Name: [name] MRN: [id number] DOB: [birth year] Today's Date: 06/05/2018   History of Present Illness  [name] is a 84 y/o female s/p Right TKA 06/04/90 with the diagnosis of osteoarthritis right knee   Clinical Impression  Pt was seen to progress mobiltiy with pt being agreeable and in minimal pain.  Her pain increased after walk and then [facility], so replenished the ice in her polar care.  Power cord was missing so informed nursing about this.  Follow acutely for strength and balance as tolerated.    Follow Up Recommendations SNF    Equipment Recommendations  None recommended by PT    Recommendations for Other Services       Precautions / Restrictions Precautions Precautions: Fall;Knee Restrictions Weight Bearing Restrictions: Yes RLE Weight Bearing: Weight bearing as tolerated      Mobility  Bed Mobility Overal bed mobility: Needs Assistance Bed Mobility: Supine to Sit;Sit to Supine     Supine to sit: Min assist Sit to supine: Min assist   General bed mobility comments: help mainly wiht RLE to get on bed and finish pivot to get off bed  Transfers Overall transfer level: Needs assistance Equipment used: Rolling walker (2 wheeled) Transfers: Sit to/from [employer] Sit to Stand: Min guard;Min assist Stand pivot transfers: Min assist       General transfer comment: took her time, had uncontrolled effort to stop sitting withBSC  Ambulation/Gait Ambulation/Gait assistance: Min guard Gait Distance (Feet): 150 Feet Assistive device: Rolling walker (2 wheeled) Gait Pattern/deviations: Decreased step length - right;Decreased stance time - right;Decreased stride length Gait velocity: decreased Gait velocity interpretation: <1.31 ft/sec, indicative of household ambulator General Gait Details: painful to load RLE  Careers information officer    Modified Rankin (Stroke Patients  Only)       Balance Overall balance assessment: Needs assistance Sitting-balance support: Feet supported Sitting balance-Leahy Scale: Good     Standing balance support: Bilateral upper extremity supported;During functional activity Standing balance-Leahy Scale: Fair Standing balance comment: less than fair dynamically                             Pertinent Vitals/Pain Pain Assessment: Faces Faces Pain Scale: Hurts little more Pain Location: right knee Pain Descriptors / Indicators: Sore;Operative site guarding Pain Intervention(s): Monitored during session;Premedicated before session;Repositioned;Ice applied    Home Living                        Prior Function                 Hand Dominance        Extremity/Trunk Assessment                Communication      Cognition Arousal/Alertness: Awake/alert Behavior During Therapy: WFL for tasks assessed/performed Overall Cognitive Status: Within Functional Limits for tasks assessed                                        General Comments      Exercises     Assessment/Plan    PT Assessment    PT Problem List         PT Treatment Interventions  PT Goals (Current goals can be found in the Care Plan section)  Acute Rehab PT Goals Patient Stated Goal: return home     Frequency 7X/week   Barriers to discharge        Co-evaluation               AM-PAC PT "6 Clicks" Daily Activity  Outcome Measure Difficulty turning over in bed (including adjusting bedclothes, sheets and blankets)?: A Little Difficulty moving from lying on back to sitting on the side of the bed? : Unable Difficulty sitting down on and standing up from a chair with arms (e.g., wheelchair, bedside commode, etc,.)?: Unable Help needed moving to and from a bed to chair (including a wheelchair)?: A Little Help needed walking in hospital room?: A Little Help needed climbing 3-5 steps with a  railing? : A Lot 6 Click Score: 13    End of Session Equipment Utilized During Treatment: Gait belt Activity Tolerance: Patient tolerated treatment well;Patient limited by fatigue Patient left: in bed;with call bell/phone within reach Nurse Communication: Mobility status PT Visit Diagnosis: Unsteadiness on feet (R26.81);Other abnormalities of gait and mobility (R26.89);Muscle weakness (generalized) (M62.81)    Time: 7253-6644 PT Time Calculation (min) (ACUTE ONLY): 31 min   Charges:     PT Treatments $Gait Training: 8-22 mins $Therapeutic Activity: 8-22 mins        Ivar Drape 06/05/2018, 7:38 PM   7:40 PM, 06/05/18 Samul Dada, PT, MS Physical Therapist - [place] [phone] (Office)

## 2018-06-05 NOTE — Addendum Note (Signed)
Addendum  created 06/05/18 0835 by Earleen Newport, CRNA   Sign clinical note

## 2018-06-05 NOTE — Progress Notes (Signed)
Subjective: 1 Day Post-Op Procedure(s) (LRB): TOTAL KNEE ARTHROPLASTY (Right) Patient reports pain as mild.    Objective: Vital signs in last 24 hours: Temp:  [97.4 F (36.3 C)-98.2 F (36.8 C)] 97.4 F (36.3 C) (09/04 0537) Pulse Rate:  [66-104] 68 (09/04 0537) Resp:  [13-21] 18 (09/04 0537) BP: (90-203)/(54-107) 117/72 (09/04 0537) SpO2:  [95 %-100 %] 97 % (09/04 0537)  Intake/Output from previous day: 09/03 0701 - 09/04 0700 In: 2508.3 [P.O.:265; I.V.:1900; IV Piggyback:343.3] Out: 4205 [Urine:4200; Blood:5] Intake/Output this shift: No intake/output data recorded.  No results for input(s): HGB in the last 72 hours. No results for input(s): WBC, RBC, HCT, PLT in the last 72 hours. Recent Labs    06/05/18 0546  NA 132*  K 3.8  CL 98  CO2 26  BUN 19  CREATININE 1.08*  GLUCOSE 106*  CALCIUM 8.7*   No results for input(s): LABPT, INR in the last 72 hours.  Neurologically intact Neurovascular intact Sensation intact distally Intact pulses distally Dorsiflexion/Plantar flexion intact Compartment soft    Assessment/Plan: 1 Day Post-Op Procedure(s) (LRB): TOTAL KNEE ARTHROPLASTY (Right) Advance diet Up with therapy D/C IV fluids Plan for discharge tomorrow    [name] 06/05/2018, 7:59 AM

## 2018-06-05 NOTE — Plan of Care (Signed)
  Problem: Acute Rehab PT Goals(only PT should resolve) Goal: Pt Will Go Supine/Side To Sit Flowsheets (Taken 06/05/2018 1230) Pt will go Supine/Side to Sit: with supervision Goal: Patient Will Transfer Sit To/From Stand Flowsheets (Taken 06/05/2018 1230) Patient will transfer sit to/from stand: with supervision Goal: Pt Will Transfer Bed To Chair/Chair To Bed Flowsheets (Taken 06/05/2018 1230) Pt will Transfer Bed to Chair/Chair to Bed: with supervision Goal: Pt Will Ambulate Flowsheets (Taken 06/05/2018 1230) Pt will Ambulate: 100 feet; with supervision   12:30 PM, 06/05/18 Ocie Bob, MPT Physical Therapist with [facility] [phone] office [phone] mobile phone

## 2018-06-05 NOTE — Evaluation (Signed)
Physical Therapy Evaluation Patient Details Name: [name] MRN: [id number] DOB: May 21, 1933 Today's Date: 06/05/2018  RIGHT KNEE ROM: 0-100 degrees AMBULATION DISTANCE: 65 feet using RW with Min assist   History of Present Illness  [name] is a 84 y/o female s/p Right TKA 06/04/90 with the diagnosis of osteoarthritis right knee   Clinical Impression  Patient instructed in and given written instructions for HEP with good return and understanding acknowledged.  Patient demonstrates limited right heel strike due knee discomfort and may be baseline due to leg length discrepancy after right THA per patient.  Patient tolerated sitting up in chair after therapy.  Patient will benefit from continued physical therapy in hospital and recommended venue below to increase strength, balance, endurance for safe ADLs and gait.    Follow Up Recommendations SNF;Supervision/Assistance - 24 hour    Equipment Recommendations  None recommended by PT    Recommendations for Other Services       Precautions / Restrictions Precautions Precautions: Fall Restrictions Weight Bearing Restrictions: Yes RLE Weight Bearing: Weight bearing as tolerated      Mobility  Bed Mobility Overal bed mobility: Needs Assistance Bed Mobility: Supine to Sit     Supine to sit: Min guard     General bed mobility comments: slightly labored movement  Transfers Overall transfer level: Needs assistance Equipment used: Rolling walker (2 wheeled) Transfers: Sit to/from [employer] Sit to Stand: Min assist Stand pivot transfers: Min assist       General transfer comment: slow labored movement  Ambulation/Gait Ambulation/Gait assistance: Min assist Gait Distance (Feet): 65 Feet Assistive device: Rolling walker (2 wheeled) Gait Pattern/deviations: Decreased step length - right;Decreased stance time - right;Decreased stride length Gait velocity: decreased   General Gait Details:  slightly labored slow cadence with limited right heel strike possibly due to leg length discrepency after right THA, limited secondary to fatigue  Stairs            Wheelchair Mobility    Modified Rankin (Stroke Patients Only)       Balance Overall balance assessment: Needs assistance Sitting-balance support: Feet supported;No upper extremity supported Sitting balance-Leahy Scale: Good     Standing balance support: During functional activity;Bilateral upper extremity supported Standing balance-Leahy Scale: Fair                               Pertinent Vitals/Pain Pain Assessment: Faces Faces Pain Scale: Hurts a little bit Pain Location: right knee Pain Descriptors / Indicators: Discomfort;Sore Pain Intervention(s): Limited activity within patient's tolerance;Monitored during session    Home Living Family/patient expects to be discharged to:: Private residence Living Arrangements: Alone Available Help at Discharge: Family;Friend(s) Type of Home: House Home Access: Stairs to enter Entrance Stairs-Rails: Right Entrance Stairs-Number of Steps: 5 Home Layout: One level Home Equipment: Other (comment);Walker - 2 wheels;Walker - 4 wheels;Bedside commode;Shower seat(walking stick)      Prior Function Level of Independence: Independent with assistive device(s)         Comments: household ambulator without AD, community distances using walking stick     Hand Dominance   Dominant Hand: Right    Extremity/Trunk Assessment   Upper Extremity Assessment Upper Extremity Assessment: Overall WFL for tasks assessed    Lower Extremity Assessment Lower Extremity Assessment: RLE deficits/detail RLE Deficits / Details: grossly -4/5    Cervical / Trunk Assessment Cervical / Trunk Assessment: Normal  Communication   Communication: No  difficulties  Cognition Arousal/Alertness: Awake/alert Behavior During Therapy: WFL for tasks assessed/performed Overall  Cognitive Status: Within Functional Limits for tasks assessed                                        General Comments      Exercises Total Joint Exercises Ankle Circles/Pumps: Supine;AROM;Strengthening;Right;5 reps Quad Sets: Supine;AROM;Strengthening;Right;5 reps Gluteal Sets: Supine;AROM;Strengthening;Both;5 reps Short Arc Quad: Supine;AROM;Strengthening;Right;5 reps Heel Slides: Supine;AROM;Strengthening;Right;10 reps Goniometric ROM: right knee: 0 - 100 degrees   Assessment/Plan    PT Assessment Patient needs continued PT services  PT Problem List Decreased strength;Decreased range of motion;Decreased activity tolerance;Decreased balance;Decreased mobility       PT Treatment Interventions Gait training;Stair training;Functional mobility training;Therapeutic activities;Therapeutic exercise;Patient/family education    PT Goals (Current goals can be found in the Care Plan section)  Acute Rehab PT Goals Patient Stated Goal: return home after rehab PT Goal Formulation: With patient Time For Goal Achievement: 06/19/18 Potential to Achieve Goals: Good    Frequency 7X/week   Barriers to discharge        Co-evaluation               AM-PAC PT "6 Clicks" Daily Activity  Outcome Measure Difficulty turning over in bed (including adjusting bedclothes, sheets and blankets)?: A Little Difficulty moving from lying on back to sitting on the side of the bed? : A Little Difficulty sitting down on and standing up from a chair with arms (e.g., wheelchair, bedside commode, etc,.)?: A Little Help needed moving to and from a bed to chair (including a wheelchair)?: A Little Help needed walking in hospital room?: A Little Help needed climbing 3-5 steps with a railing? : A Lot 6 Click Score: 17    End of Session   Activity Tolerance: Patient tolerated treatment well;Patient limited by fatigue Patient left: in chair;with call bell/phone within reach Nurse  Communication: Mobility status PT Visit Diagnosis: Unsteadiness on feet (R26.81);Other abnormalities of gait and mobility (R26.89);Muscle weakness (generalized) (M62.81)    Time: 8032-1224 PT Time Calculation (min) (ACUTE ONLY): 30 min   Charges:   PT Evaluation $PT Eval Moderate Complexity: 1 Mod PT Treatments $Therapeutic Activity: 23-37 mins        12:29 PM, 06/05/18 Ocie Bob, MPT Physical Therapist with [facility] [phone] office [phone] mobile phone

## 2018-06-05 NOTE — [place] FL2 (Signed)
[place] MEDICAID FL2 LEVEL OF CARE SCREENING TOOL     IDENTIFICATION  Patient Name: [name] Birthdate: June 05, 1933 Sex: female Admission Date (Current Location): 06/04/2018  [facility] and [place] Number:  [employer] and Address:  [facility],  [address] [place] [address]      Provider Number: [id number]  Attending Physician Name and Address:  Vickki Hearing, MD  Relative Name and Phone Number:       Current Level of Care: Hospital Recommended Level of Care: Nursing Facility Prior Approval Number:    Date Approved/Denied:   PASRR Number: [id number])  Discharge Plan: SNF    Current Diagnoses: Patient Active Problem List   Diagnosis Date Noted  . Primary osteoarthritis of right knee   . Osteoarthritis of left hip 08/17/2016  . S/P closed reduction of dislocated total hip prothesis   . H/O total hip arthroplasty 08/07/2016 05/12/2015    Orientation RESPIRATION BLADDER Height & Weight     Self, Time, Situation, Place  Normal Continent Weight:   Height:     BEHAVIORAL SYMPTOMS/MOOD NEUROLOGICAL BOWEL NUTRITION STATUS      Continent Diet(Regular )  AMBULATORY STATUS COMMUNICATION OF NEEDS Skin   Limited Assist   Surgical wounds(right knee)                       Personal Care Assistance Level of Assistance  Bathing, Feeding, Dressing Bathing Assistance: Limited assistance Feeding assistance: Independent Dressing Assistance: Limited assistance     Functional Limitations Info  Sight, Hearing, Speech Sight Info: Adequate Hearing Info: Adequate Speech Info: Adequate    SPECIAL CARE FACTORS FREQUENCY  PT (By licensed PT)     PT Frequency: 5x/week              Contractures Contractures Info: Not present    Additional Factors Info  Code Status, Allergies, Psychotropic Code Status Info: Full Code Allergies Info: Sulfa Antibiotics  Psychotropic Info: Klonopin, Desyrel         Current  Medications (06/05/2018):  This is the current hospital active medication list Current Facility-Administered Medications  Medication Dose Route Frequency Provider Last Rate Last Dose  . 0.9 %  sodium chloride infusion   Intravenous Continuous Vickki Hearing, MD 75 mL/hr at 06/04/18 1257    . alum & mag hydroxide-simeth (MAALOX/MYLANTA) 200-200-20 MG/5ML suspension 30 mL  30 mL Oral Q4H PRN Vickki Hearing, MD      . aspirin EC tablet 325 mg  325 mg Oral Q breakfast Vickki Hearing, MD   325 mg at 06/05/18 3748  . clonazePAM (KLONOPIN) disintegrating tablet 0.25 mg  0.25 mg Oral QHS Vickki Hearing, MD   0.25 mg at 06/04/18 2133  . docusate sodium (COLACE) capsule 100 mg  100 mg Oral BID Vickki Hearing, MD   100 mg at 06/05/18 1053  . losartan (COZAAR) tablet 100 mg  100 mg Oral Daily Vickki Hearing, MD       Or  . hydrochlorothiazide (HYDRODIURIL) tablet 25 mg  25 mg Oral Daily Vickki Hearing, MD   25 mg at 06/05/18 1053  . HYDROcodone-acetaminophen (NORCO/VICODIN) 5-325 MG per tablet 1 tablet  1 tablet Oral Q4H PRN Vickki Hearing, MD      . levothyroxine (SYNTHROID, LEVOTHROID) tablet 112 mcg  112 mcg Oral QAC breakfast Vickki Hearing, MD   112 mcg at 06/05/18 2707  . menthol-cetylpyridinium (CEPACOL) lozenge 3 mg  1 lozenge Oral PRN Vickki Hearing, MD       Or  . phenol (CHLORASEPTIC) mouth spray 1 spray  1 spray Mouth/Throat PRN Vickki Hearing, MD      . metoCLOPramide (REGLAN) tablet 5-10 mg  5-10 mg Oral Q8H PRN Vickki Hearing, MD       Or  . metoCLOPramide (REGLAN) injection 5-10 mg  5-10 mg Intravenous Q8H PRN Vickki Hearing, MD      . morphine 2 MG/ML injection 0.5-1 mg  0.5-1 mg Intravenous Q2H PRN Vickki Hearing, MD      . naproxen (NAPROSYN) tablet 250 mg  250 mg Oral BID WC Vickki Hearing, MD   250 mg at 06/05/18 0809  . ondansetron (ZOFRAN) tablet 4 mg  4 mg Oral Q6H PRN Vickki Hearing, MD       Or  .  ondansetron [facility]) injection 4 mg  4 mg Intravenous Q6H PRN Vickki Hearing, MD      . traMADol Janean Sark) tablet 50 mg  50 mg Oral Q6H Vickki Hearing, MD   50 mg at 06/05/18 1119  . traZODone (DESYREL) tablet 100 mg  100 mg Oral QHS Vickki Hearing, MD   100 mg at 06/04/18 2133   Facility-Administered Medications Ordered in Other Encounters  Medication Dose Route Frequency Provider Last Rate Last Dose  . bupivacaine liposome (EXPAREL) 1.3 % injection 266 mg  20 mL Infiltration Once Dimitri Ped, PA-C         Discharge Medications: Please see discharge summary for a list of discharge medications.  Relevant Imaging Results:  Relevant Lab Results:   Additional Information SSN [id number] Juleen China, LCSW

## 2018-06-06 LAB — CBC
HEMATOCRIT: 31.5 % — AB (ref 36.0–46.0)
HEMOGLOBIN: 10.7 g/dL — AB (ref 12.0–15.0)
MCH: 30.5 pg (ref 26.0–34.0)
MCHC: 34 g/dL (ref 30.0–36.0)
MCV: 89.7 fL (ref 78.0–100.0)
Platelets: 231 10*3/uL (ref 150–400)
RBC: 3.51 MIL/uL — AB (ref 3.87–5.11)
RDW: 13.2 % (ref 11.5–15.5)
WBC: 10.5 10*3/uL (ref 4.0–10.5)

## 2018-06-06 MED ORDER — LOSARTAN POTASSIUM 50 MG PO TABS
100.0000 mg | ORAL_TABLET | Freq: Every day | ORAL | Status: DC
  Administered 2018-06-07: 100 mg via ORAL
  Filled 2018-06-06: qty 2

## 2018-06-06 MED ORDER — HYDROCHLOROTHIAZIDE 25 MG PO TABS
25.0000 mg | ORAL_TABLET | Freq: Every day | ORAL | Status: DC
  Administered 2018-06-06 – 2018-06-07 (×2): 25 mg via ORAL
  Filled 2018-06-06: qty 1

## 2018-06-06 NOTE — Progress Notes (Signed)
Physical Therapy Treatment Patient Details Name: [name] MRN: [id number] DOB: 1933-08-23 Today's Date: 06/06/2018    History of Present Illness ARISBET DETERDING is a 84 y/o female s/p Right TKA 06/04/90 with the diagnosis of osteoarthritis right knee     PT Comments    Patient presents seated in chair (assisted by nursing staff), c/o stiffness pain in right knee when flexed, able to achieve up to 0-100 degrees right knee ROM, improvement for right heel to toe stepping after raising RW and patient continued sitting up in chair after therapy.  Patient will benefit from continued physical therapy in hospital and recommended venue below to increase strength, balance, endurance for safe ADLs and gait.    Follow Up Recommendations  SNF     Equipment Recommendations  None recommended by PT    Recommendations for Other Services       Precautions / Restrictions Precautions Precautions: Fall Precaution Comments: s/p right TKA Restrictions Weight Bearing Restrictions: Yes RLE Weight Bearing: Weight bearing as tolerated    Mobility  Bed Mobility               General bed mobility comments: Patient presents up in chair (assisted by nursing staff)  Transfers Overall transfer level: Needs assistance Equipment used: Rolling walker (2 wheeled) Transfers: Sit to/from [employer] Sit to Stand: Min guard Stand pivot transfers: Min guard       General transfer comment: has diffiuclty controlling decent when stand to sits, falls into chair mostly due to left knee pain  Ambulation/Gait Ambulation/Gait assistance: Min guard Gait Distance (Feet): 100 Feet Assistive device: Rolling walker (2 wheeled) Gait Pattern/deviations: Decreased step length - right;Decreased stance time - right;Decreased stride length Gait velocity: decreased   General Gait Details: slightly labored slow cadence with fair return for right heel to toe stepping, no loss of balance,  limited secondary to fatigue   Stairs             Wheelchair Mobility    Modified Rankin (Stroke Patients Only)       Balance Overall balance assessment: Needs assistance Sitting-balance support: Feet supported;No upper extremity supported Sitting balance-Leahy Scale: Good     Standing balance support: Bilateral upper extremity supported;During functional activity Standing balance-Leahy Scale: Fair Standing balance comment: using RW                            Cognition Arousal/Alertness: Awake/alert Behavior During Therapy: WFL for tasks assessed/performed Overall Cognitive Status: Within Functional Limits for tasks assessed                                        Exercises Total Joint Exercises Ankle Circles/Pumps: Supine;AROM;Strengthening;Right;10 reps Quad Sets: Supine;AROM;Strengthening;Right;10 reps Short Arc Quad: Supine;AROM;Strengthening;Right;5 reps    General Comments        Pertinent Vitals/Pain Pain Assessment: Faces Faces Pain Scale: Hurts a little bit Pain Location: right knee Pain Descriptors / Indicators: Sore;Grimacing;Discomfort Pain Intervention(s): Limited activity within patient's tolerance;Monitored during session    Home Living                      Prior Function            PT Goals (current goals can now be found in the care plan section) Acute Rehab PT Goals Patient Stated Goal: return home  PT Goal Formulation: With patient Time For Goal Achievement: 06/19/18 Potential to Achieve Goals: Good Progress towards PT goals: Progressing toward goals    Frequency    7X/week      PT Plan Current plan remains appropriate    Co-evaluation              AM-PAC PT "6 Clicks" Daily Activity  Outcome Measure  Difficulty turning over in bed (including adjusting bedclothes, sheets and blankets)?: A Little Difficulty moving from lying on back to sitting on the side of the bed? : A  Little Difficulty sitting down on and standing up from a chair with arms (e.g., wheelchair, bedside commode, etc,.)?: A Little Help needed moving to and from a bed to chair (including a wheelchair)?: A Little Help needed walking in hospital room?: A Little Help needed climbing 3-5 steps with a railing? : A Lot 6 Click Score: 17    End of Session   Activity Tolerance: Patient tolerated treatment well;Patient limited by fatigue Patient left: in chair;with call bell/phone within reach Nurse Communication: Mobility status PT Visit Diagnosis: Unsteadiness on feet (R26.81);Other abnormalities of gait and mobility (R26.89);Muscle weakness (generalized) (M62.81)     Time: 4098-1191 PT Time Calculation (min) (ACUTE ONLY): 21 min  Charges:  $Gait Training: 8-22 mins                      3:04 PM, 06/06/18 Ocie Bob, MPT Physical Therapist with [facility] [phone] office [phone] mobile phone

## 2018-06-06 NOTE — Clinical Social Work Note (Signed)
[facility] authorization started. [id number] reference number [id number]. Advised that a decision would be made within 24 hours and that they would contact LCSW if additional clinicals were needed.    Analisa Sledd, Juleen China, LCSW

## 2018-06-06 NOTE — Progress Notes (Addendum)
Subjective: 2 Days Post-Op Procedure(s) (LRB): TOTAL KNEE ARTHROPLASTY (Right) Patient reports pain as 0 on 0-10 scale and Moderate when weightbearing.    Objective: Vital signs in last 24 hours: Temp:  [97.5 F (36.4 C)-97.7 F (36.5 C)] 97.5 F (36.4 C) (09/05 1106) Pulse Rate:  [67-68] 68 (09/05 1106) Resp:  [12-16] 16 (09/05 1106) BP: (159-172)/(76-96) 172/76 (09/05 1106) SpO2:  [96 %-99 %] 99 % (09/05 1106)  Intake/Output from previous day: 09/04 0701 - 09/05 0700 In: 1088.8 [P.O.:720; I.V.:368.8] Out: -  Intake/Output this shift: Total I/O In: 240 [P.O.:240] Out: -   Recent Labs    06/05/18 0546 06/06/18 0549  HGB 10.4* 10.7*   Recent Labs    06/05/18 0546 06/06/18 0549  WBC 8.7 10.5  RBC 3.46* 3.51*  HCT 30.6* 31.5*  PLT 218 231   Recent Labs    06/05/18 0546  NA 132*  K 3.8  CL 98  CO2 26  BUN 19  CREATININE 1.08*  GLUCOSE 106*  CALCIUM 8.7*   No results for input(s): LABPT, INR in the last 72 hours.  Neurologically intact Neurovascular intact Sensation intact distally Intact pulses distally Dorsiflexion/Plantar flexion intact Compartment soft  [employer] decision on rehabilitation bed pending, in 24 hours they will give [place] a decision  Physical Therapy Evaluation Patient Details Name: RENONA MCNAB MRN: [id number] DOB: [birth year] Today's Date: 06/05/2018   RIGHT KNEE ROM: 0-100 degrees AMBULATION DISTANCE: 65 feet using RW with Min assist     Assessment/Plan: 2 Days Post-Op Procedure(s) (LRB): TOTAL KNEE ARTHROPLASTY (Right) Up with therapy Plan for discharge tomorrow    Fuller Canada 06/06/2018, 1:53 PM

## 2018-06-07 ENCOUNTER — Inpatient Hospital Stay
Admission: RE | Admit: 2018-06-07 | Discharge: 2018-06-14 | Disposition: A | Discharge status: Home / Self Care | Payer: Medicare Other | Source: Ambulatory Visit | Attending: Internal Medicine | Admitting: Internal Medicine

## 2018-06-07 DIAGNOSIS — Z471 Aftercare following joint replacement surgery: Secondary | ICD-10-CM | POA: Diagnosis not present

## 2018-06-07 DIAGNOSIS — E039 Hypothyroidism, unspecified: Secondary | ICD-10-CM | POA: Diagnosis not present

## 2018-06-07 DIAGNOSIS — R2689 Other abnormalities of gait and mobility: Secondary | ICD-10-CM | POA: Diagnosis not present

## 2018-06-07 DIAGNOSIS — R262 Difficulty in walking, not elsewhere classified: Secondary | ICD-10-CM | POA: Diagnosis not present

## 2018-06-07 DIAGNOSIS — M6281 Muscle weakness (generalized): Secondary | ICD-10-CM | POA: Diagnosis not present

## 2018-06-07 DIAGNOSIS — F419 Anxiety disorder, unspecified: Secondary | ICD-10-CM | POA: Diagnosis not present

## 2018-06-07 DIAGNOSIS — Z96651 Presence of right artificial knee joint: Secondary | ICD-10-CM | POA: Diagnosis not present

## 2018-06-07 DIAGNOSIS — Z96641 Presence of right artificial hip joint: Secondary | ICD-10-CM | POA: Diagnosis not present

## 2018-06-07 DIAGNOSIS — I10 Essential (primary) hypertension: Secondary | ICD-10-CM | POA: Diagnosis not present

## 2018-06-07 LAB — CBC
HEMATOCRIT: 29.7 % — AB (ref 36.0–46.0)
HEMOGLOBIN: 10.4 g/dL — AB (ref 12.0–15.0)
MCH: 31.3 pg (ref 26.0–34.0)
MCHC: 35 g/dL (ref 30.0–36.0)
MCV: 89.5 fL (ref 78.0–100.0)
Platelets: 209 10*3/uL (ref 150–400)
RBC: 3.32 MIL/uL — AB (ref 3.87–5.11)
RDW: 13.3 % (ref 11.5–15.5)
WBC: 8.6 10*3/uL (ref 4.0–10.5)

## 2018-06-07 MED ORDER — TRAZODONE HCL 50 MG PO TABS: 100.0000 mg | ORAL_TABLET | Freq: Every day | ORAL | 0 refills | Status: AC

## 2018-06-07 MED ORDER — ASPIRIN 325 MG PO TBEC: 325.0000 mg | DELAYED_RELEASE_TABLET | Freq: Every day | ORAL | 0 refills | Status: AC

## 2018-06-07 MED ORDER — HYDROCODONE-ACETAMINOPHEN 5-325 MG PO TABS: 1.0000 | ORAL_TABLET | ORAL | 0 refills | Status: DC | PRN

## 2018-06-07 MED ORDER — CLONAZEPAM 0.5 MG PO TABS: 0.2500 mg | ORAL_TABLET | Freq: Every day | ORAL | 3 refills | Status: DC

## 2018-06-07 NOTE — Clinical Social Work Placement (Signed)
   CLINICAL SOCIAL WORK PLACEMENT  NOTE  Date:  06/07/2018  Patient Details  Name: LARICE BAILLARGEON MRN: [id number] Date of Birth: July 26, 1933  Clinical Social Work is seeking post-discharge placement for this patient at the Skilled  Nursing Facility level of care (*CSW will initial, date and re-position this form in  chart as items are completed):  Yes   Patient/family provided with [facility] Clinical Social Work Department's list of facilities offering this level of care within the geographic area requested by the patient (or if unable, by the patient's family).  Yes   Patient/family informed of their freedom to choose among providers that offer the needed level of care, that participate in Medicare, Medicaid or managed care program needed by the patient, have an available bed and are willing to accept the patient.  Yes   Patient/family informed of [facility]'s ownership interest in [facility] and [facility], as well as of the fact that they are under no obligation to receive care at these facilities.  PASRR submitted to EDS on       PASRR number received on       Existing PASRR number confirmed on 06/05/18     FL2 transmitted to all facilities in geographic area requested by pt/family on 06/05/18     FL2 transmitted to all facilities within larger geographic area on       Patient informed that his/her managed care company has contracts with or will negotiate with certain facilities, including the following:        Yes   Patient/family informed of bed offers received.  Patient chooses bed at [facility]     Physician recommends and patient chooses bed at      Patient to be transferred to [facility] on 06/07/18.  Patient to be transferred to facility by [facility] staff     Patient family notified on 06/07/18 of transfer.  Name of family member notified:  attempted to contact sister listed in chart      PHYSICIAN       Additional Comment:    Facility notified. Discharge clinicals sent. LCSW signing off.   _______________________________________________ Annice Needy, LCSW 06/07/2018, 2:12 PM

## 2018-06-07 NOTE — Discharge Summary (Signed)
Physician Discharge Summary  Patient ID: [name] MRN: [id number] DOB/AGE: 82-19-1934 82 y.o.  Admit date: 06/04/2018 Discharge date: 06/07/2018  Admission Diagnoses: Arthritis right knee  Discharge Diagnoses: Same Active Problems:   Primary osteoarthritis of right knee   Discharged Condition: good  Hospital Course: Uncomplicated hospital course  Patient came in for total knee replacement on the right on 3 September tolerated that well.  She was able to progress in physical therapy without difficulty.  She was evaluated for possible skilled nursing facility and her discharge has been delayed waiting for the insurance company to call back with approval.  If she is not approved in she will go home with home health  CBC Latest Ref Rng & Units 06/07/2018 06/06/2018 06/05/2018  WBC 4.0 - 10.5 K/uL 8.6 10.5 8.7  Hemoglobin 12.0 - 15.0 g/dL 10.4(L) 10.7(L) 10.4(L)  Hematocrit 36.0 - 46.0 % 29.7(L) 31.5(L) 30.6(L)  Platelets 150 - 400 K/uL 209 231 218   BMP Latest Ref Rng & Units 06/05/2018 05/30/2018 08/18/2016  Glucose 70 - 99 mg/dL 409(W) 119(J) 478(G)  BUN 8 - 23 mg/dL 19 16 15   Creatinine 0.44 - 1.00 mg/dL 9.56(O) 1.30 8.65(H)  Sodium 135 - 145 mmol/L 132(L) 129(L) 131(L)  Potassium 3.5 - 5.1 mmol/L 3.8 3.7 3.8  Chloride 98 - 111 mmol/L 98 94(L) 99(L)  CO2 22 - 32 mmol/L 26 25 26   Calcium 8.9 - 10.3 mg/dL 8.4(O) 9.1 9.6(E)    Discharge Exam: Blood pressure (!) 152/75, pulse 72, temperature 97.6 F (36.4 C), temperature source Oral, resp. rate 18, SpO2 99 %. She remains awake alert and oriented without confusion Neurovascular exam is intact  Patient presents seated in chair (assisted by nursing staff), c/o stiffness pain in right knee when flexed, able to achieve up to 0-100 degrees right knee ROM, improvement for right heel to toe stepping after raising RW and patient continued sitting up in chair after therapy.  Patient will benefit from continued physical therapy in hospital  and recommended venue below to increase strength, balance, endurance for safe ADLs and gait.  Physical Therapy Evaluation Patient Details Name: [name] MRN: [id number] DOB: July 03, 1933 Today's Date: 06/05/2018   RIGHT KNEE ROM: 0-100 degrees AMBULATION DISTANCE: 65 feet using RW with Min assist   Ambulation/Gait assistance: Min guard Gait Distance (Feet): 150 Feet Assistive device: Rolling walker (2 wheeled) Gait Pattern/deviations: Decreased step length    Disposition: Discharge disposition: 70-Another Health Care Institution Not Defined       Discharge Instructions    Ambulatory referral to Home Health   Complete by:  As directed    Please evaluate AIDEN RAO for admission to [facility].  Disciplines requested: Physical Therapy  Services to provide: Strengthening Exercises  Physician to follow patient's care (the person listed here will be responsible for signing ongoing orders): Referring Provider  Requested Start of Care Date: Tomorrow  I certify that this patient is under my care and that I, or a Nurse Practitioner or Physician's Assistant working with me, had a face-to-face encounter that meets the physician face-to-face requirements with patient on 9.6.19. The encounter with the patient was in whole, or in part for the following medical condition(s) which is the primary reason for home health care (List medical condition). tka right   Special Instructions:  no   Does the patient have Medicare or Medicaid?:  Yes   The encounter with the patient was in whole, or in part, for the following medical condition, which  is the primary reason for home health care:  right tka   Reason for Medically Necessary Home Health Services:  Therapy- Investment banker, operational, Patent examiner   My clinical findings support the need for the above services:  Unable to leave home safely without assistance and/or assistive device   I certify that, based on my findings,  the following services are medically necessary home health services:  Physical therapy   Further, I certify that my clinical findings support that this patient is homebound due to:  Pain interferes with ambulation/mobility   Call MD / Call 911   Complete by:  As directed    If you experience chest pain or shortness of breath, CALL 911 and be transported to the hospital emergency room.  If you develope a fever above 101 F, pus (white drainage) or increased drainage or redness at the wound, or calf pain, call your surgeon's office.   Constipation Prevention   Complete by:  As directed    Drink plenty of fluids.  Prune juice may be helpful.  You may use a stool softener, such as Colace (over the counter) 100 mg twice a day.  Use MiraLax (over the counter) for constipation as needed.   Diet - low sodium heart healthy   Complete by:  As directed    Increase activity slowly as tolerated   Complete by:  As directed      Allergies as of 06/07/2018      Reactions   Sulfa Antibiotics Other (See Comments)   "like pens and needles all over my body, tingling"--pt states she isnt sure if she is allergic to it because she was on another medication at the same time.       Medication List    STOP taking these medications   TYLENOL 8 HOUR ARTHRITIS PAIN 650 MG CR tablet Generic drug:  acetaminophen     TAKE these medications   aspirin 325 MG EC tablet Take 1 tablet (325 mg total) by mouth daily with breakfast. Start taking on:  06/08/2018   clonazePAM 0.5 MG tablet Commonly known as:  KLONOPIN Take 0.5 tablets (0.25 mg total) by mouth at bedtime.   HYDROcodone-acetaminophen 5-325 MG tablet Commonly known as:  NORCO/VICODIN Take 1 tablet by mouth every 4 (four) hours as needed for moderate pain.   levothyroxine 112 MCG tablet Commonly known as:  SYNTHROID, LEVOTHROID Take 112 mcg by mouth daily before breakfast.   losartan-hydrochlorothiazide 100-25 MG tablet Commonly known as:  HYZAAR Take 1  tablet by mouth daily.   traZODone 50 MG tablet Commonly known as:  DESYREL Take 2 tablets (100 mg total) by mouth at bedtime.      Contact information for after-discharge care    Destination    [facility] Preferred SNF .   Service:  Skilled Nursing Contact information: [address] [place] [address] [phone]              Signed: Fuller Canada 06/07/2018, 1:37 PM

## 2018-06-07 NOTE — Clinical Social Work Note (Signed)
Per Tami at [facility], [facility] requested updated clinicals and she sent them. Awaiting authorization.   Jyrah Blye, Juleen China, LCSW

## 2018-06-07 NOTE — Progress Notes (Signed)
Pt's IV catheter removed and intact. Pt's IV site clean dry and intact. Report called and given to [facility]. Pt in stable condition and in no acute distress at time of discharge. Pt will be escorted by nurse tech.

## 2018-06-07 NOTE — Progress Notes (Signed)
Physical Therapy Treatment Patient Details Name: [name] MRN: [id number] DOB: 1933/06/29 Today's Date: 06/07/2018    History of Present Illness [name] is a 84 y/o female s/p Right TKA 06/04/90 with the diagnosis of osteoarthritis right knee     PT Comments    Pt was seen for treatment of her R knee with ROM exercises and pain maangement with ice and gentle mobility.  Pt was in a great deal of pain with melted polar care so was able to reduce her pain with visit.  Follow along for gait later if tolerating therapy, and will recommend skilled care as she is still very limited for standing and independence of movement with the R knee.   Follow Up Recommendations  SNF     Equipment Recommendations  None recommended by PT    Recommendations for Other Services       Precautions / Restrictions Precautions Precautions: Knee;Fall Precaution Comments: s/p right TKA Restrictions Weight Bearing Restrictions: Yes RLE Weight Bearing: Weight bearing as tolerated    Mobility  Bed Mobility               General bed mobility comments: declines OOB  Transfers                    Ambulation/Gait                 Stairs             Wheelchair Mobility    Modified Rankin (Stroke Patients Only)       Balance                                            Cognition Arousal/Alertness: Awake/alert Behavior During Therapy: WFL for tasks assessed/performed Overall Cognitive Status: Within Functional Limits for tasks assessed                                        Exercises Total Joint Exercises Ankle Circles/Pumps: AROM;Both;5 reps Quad Sets: AROM;Both;10 reps Gluteal Sets: AROM;Both;10 reps Heel Slides: AROM;Both;10 reps Hip ABduction/ADduction: AROM;AAROM;Both;10 reps Straight Leg Raises: AROM;AAROM;Both;10 reps    General Comments        Pertinent Vitals/Pain Pain Assessment: 0-10 Pain Score:  7  Pain Location: right knee Pain Descriptors / Indicators: Operative site guarding Pain Intervention(s): Monitored during session;Premedicated before session;Repositioned;Ice applied    Home Living                      Prior Function            PT Goals (current goals can now be found in the care plan section) Progress towards PT goals: Progressing toward goals    Frequency    7X/week      PT Plan Current plan remains appropriate    Co-evaluation              AM-PAC PT "6 Clicks" Daily Activity  Outcome Measure  Difficulty turning over in bed (including adjusting bedclothes, sheets and blankets)?: A Little Difficulty moving from lying on back to sitting on the side of the bed? : A Little Difficulty sitting down on and standing up from a chair with arms (e.g., wheelchair, bedside commode, etc,.)?: Unable Help needed moving to  and from a bed to chair (including a wheelchair)?: A Little Help needed walking in hospital room?: A Little Help needed climbing 3-5 steps with a railing? : A Little 6 Click Score: 16    End of Session   Activity Tolerance: Patient limited by fatigue Patient left: in bed;with call bell/phone within reach;with bed alarm set Nurse Communication: Mobility status;Other (comment)(ice replaced in polar care) PT Visit Diagnosis: Unsteadiness on feet (R26.81);Other abnormalities of gait and mobility (R26.89);Muscle weakness (generalized) (M62.81)     Time: 5397-6734 PT Time Calculation (min) (ACUTE ONLY): 26 min  Charges:  $Therapeutic Exercise: 23-37 mins                     Ivar Drape 06/07/2018, 10:44 PM   10:46 PM, 06/07/18 Samul Dada, PT, MS Physical Therapist - [place] [phone] (Office)

## 2018-06-07 NOTE — Care Management Important Message (Signed)
Important Message  Patient Details  Name: [name] MRN: [id number] Date of Birth: 07/09/1933   Medicare Important Message Given:  Yes    Renie Ora 06/07/2018, 2:28 PM

## 2018-06-08 ENCOUNTER — Encounter (HOSPITAL_COMMUNITY)
Admission: RE | Admit: 2018-06-08 | Discharge: 2018-06-08 | Disposition: A | Discharge status: Home / Self Care | Payer: Medicare Other | Source: Skilled Nursing Facility | Attending: Internal Medicine | Admitting: Internal Medicine

## 2018-06-08 LAB — CBC WITH DIFFERENTIAL/PLATELET
Basophils Absolute: 0 10*3/uL (ref 0.0–0.1)
Basophils Relative: 0 %
EOS ABS: 0.2 10*3/uL (ref 0.0–0.7)
EOS PCT: 2 %
HCT: 33.5 % — ABNORMAL LOW (ref 36.0–46.0)
Hemoglobin: 11.3 g/dL — ABNORMAL LOW (ref 12.0–15.0)
LYMPHS ABS: 1.9 10*3/uL (ref 0.7–4.0)
Lymphocytes Relative: 20 %
MCH: 30.3 pg (ref 26.0–34.0)
MCHC: 33.7 g/dL (ref 30.0–36.0)
MCV: 89.8 fL (ref 78.0–100.0)
MONO ABS: 0.6 10*3/uL (ref 0.1–1.0)
MONOS PCT: 7 %
Neutro Abs: 6.5 10*3/uL (ref 1.7–7.7)
Neutrophils Relative %: 71 %
PLATELETS: 242 10*3/uL (ref 150–400)
RBC: 3.73 MIL/uL — ABNORMAL LOW (ref 3.87–5.11)
RDW: 13.1 % (ref 11.5–15.5)
WBC: 9.2 10*3/uL (ref 4.0–10.5)

## 2018-06-08 LAB — BASIC METABOLIC PANEL
Anion gap: 12 (ref 5–15)
BUN: 20 mg/dL (ref 8–23)
CALCIUM: 9.1 mg/dL (ref 8.9–10.3)
CHLORIDE: 94 mmol/L — AB (ref 98–111)
CO2: 27 mmol/L (ref 22–32)
CREATININE: 0.96 mg/dL (ref 0.44–1.00)
GFR calc Af Amer: >60 mL/min (ref >60)
GFR calc non Af Amer: 53 mL/min — ABNORMAL LOW (ref >60)
Glucose, Bld: 89 mg/dL (ref 70–99)
Potassium: 3.8 mmol/L (ref 3.5–5.1)
Sodium: 133 mmol/L — ABNORMAL LOW (ref 135–145)

## 2018-06-10 ENCOUNTER — Encounter: Payer: Self-pay | Admitting: Internal Medicine

## 2018-06-10 ENCOUNTER — Non-Acute Institutional Stay (SKILLED_NURSING_FACILITY): Payer: Medicare Other | Admitting: Internal Medicine

## 2018-06-10 ENCOUNTER — Telehealth: Payer: Self-pay | Admitting: Orthopedic Surgery

## 2018-06-10 ENCOUNTER — Other Ambulatory Visit: Payer: Self-pay

## 2018-06-10 DIAGNOSIS — F419 Anxiety disorder, unspecified: Secondary | ICD-10-CM

## 2018-06-10 DIAGNOSIS — E039 Hypothyroidism, unspecified: Secondary | ICD-10-CM

## 2018-06-10 DIAGNOSIS — Z96651 Presence of right artificial knee joint: Secondary | ICD-10-CM

## 2018-06-10 DIAGNOSIS — I10 Essential (primary) hypertension: Secondary | ICD-10-CM | POA: Diagnosis not present

## 2018-06-10 MED ORDER — CLONAZEPAM 0.5 MG PO TABS: 0.2500 mg | ORAL_TABLET | Freq: Every day | ORAL | 0 refills | Status: DC

## 2018-06-10 MED ORDER — HYDROCODONE-ACETAMINOPHEN 5-325 MG PO TABS: 1.0000 | ORAL_TABLET | ORAL | 0 refills | Status: DC | PRN

## 2018-06-10 NOTE — Telephone Encounter (Signed)
RX Fax for [facility]@ [phone]  

## 2018-06-10 NOTE — Telephone Encounter (Signed)
2 weeks post-op

## 2018-06-10 NOTE — Telephone Encounter (Signed)
[name] from [facility] called this morning stating Ms. [name] had TKA done on Tuesday, 06/04/18.  She wanted to know when the post op appointment with Dr. Romeo Apple was scheduled for.  I checked Dr. Mort Sawyers notes and did not find when he wanted her to return.    Would you ask him when he wants Ms. [name] back in the office?  Let me know and I will be glad to schedule this and then call [name] at the [facility]

## 2018-06-10 NOTE — Progress Notes (Signed)
Provider: Einar Crow MD  Location:    [facility] Room Number: 158/P Place of Service:  SNF (31)  PCP: Carylon Perches, MD Patient Care Team: Carylon Perches, MD as PCP - General (Internal Medicine)  Extended Emergency Contact Information Primary Emergency Contact: Janina Mayo, [place] Home Phone: [phone] Mobile Phone: [phone] Relation: Daughter Secondary Emergency Contact: Gwenette Greet, [place] [address] [place] Home Phone: [phone] Mobile Phone: [phone] Relation: Daughter  Code Status: Full Code Goals of Care: Advanced Directive information Advanced Directives 06/10/2018  Does Patient Have a Medical Advance Directive? Yes  Type of Advance Directive (No Data)  Does patient want to make changes to medical advance directive? No - Patient declined  Copy of Healthcare Power of Attorney in Chart? No - copy requested  Would patient like information on creating a medical advance directive? -      Chief Complaint  Patient presents with  . New Admit To SNF    New Admission Visit    HPI: Patient is a 84 y.o. female seen today for admission to SNF for therapy after Staying in the hospital from 09/03-09/06 For TKA. Patient has a history of hypertension, hypothyroidism, arthritis and anxiety She was admitted for TKA in the hospital.  She underwent the procedure on 09/03.  Her postop course was on uneventful.  Now in SNF for therapy Patient's only complaint today was constipation.  Her pain is controlled with hydrocodone.  She is weightbearing and is already walking with assist. Patient lives by herself but has lot of family support Before surgery patient was independent and driving.  Past Medical History:  Diagnosis Date  . Anxiety   . Arthritis   . Hypertension   . Hypothyroidism   . Thyroid disease    Past Surgical History:  Procedure Laterality Date  . BREAST SURGERY  1993   L BREAST BX - BENIGN  . HIP CLOSED REDUCTION Right 06/20/2015   Procedure: CLOSED REDUCTION RIGHT HIP;  Surgeon: Vickki Hearing, MD;  Location: [facility];  Service: Orthopedics;  Laterality: Right;  . TONSILLECTOMY    . TOTAL HIP ARTHROPLASTY Right 05/12/2015   Procedure: RIGHT TOTAL HIP ARTHROPLASTY;  Surgeon: Ranee Gosselin, MD;  Location: [facility];  Service: Orthopedics;  Laterality: Right;  . TOTAL HIP ARTHROPLASTY Left 08/17/2016   Procedure: TOTAL HIP ARTHROPLASTY;  Surgeon: Vickki Hearing, MD;  Location: [facility];  Service: Orthopedics;  Laterality: Left;  . TOTAL KNEE ARTHROPLASTY Right 06/04/2018   Procedure: TOTAL KNEE ARTHROPLASTY;  Surgeon: Vickki Hearing, MD;  Location: [facility];  Service: Orthopedics;  Laterality: Right;  . TUBAL LIGATION      reports that she has never smoked. She has never used smokeless tobacco. She reports that she drinks alcohol. She reports that she does not use drugs. Social History   Socioeconomic History  . Marital status: Married    Spouse name: Not on file  . Number of children: Not on file  . Years of education: Not on file  . Highest education level: Not on file  Occupational History  . Not on file  Social Needs  . Financial resource strain: Not on file  . Food insecurity:    Worry: Not on file    Inability: Not on file  . Transportation needs:    Medical: Not on file    Non-medical:  Not on file  Tobacco Use  . Smoking status: Never Smoker  . Smokeless tobacco: Never Used  Substance and Sexual Activity  . Alcohol use: Yes    Comment: RARE  . Drug use: No  . Sexual activity: Not Currently    Birth control/protection: None  Lifestyle  . Physical activity:    Days per week: Not on file    Minutes per session: Not on file  . Stress: Not on file  Relationships  . Social connections:    Talks on phone: Not on file    Gets together: Not on file    Attends religious service: Not on file    Active member of club or organization: Not  on file    Attends meetings of clubs or organizations: Not on file    Relationship status: Not on file  . Intimate partner violence:    Fear of current or ex partner: Not on file    Emotionally abused: Not on file    Physically abused: Not on file    Forced sexual activity: Not on file  Other Topics Concern  . Not on file  Social History Narrative  . Not on file    Functional Status Survey:    Family History  Problem Relation Age of Onset  . Congestive Heart Failure Mother   . Kidney failure Mother   . Stroke Father     Health Maintenance  Topic Date Due  . INFLUENZA VACCINE  07/10/2018 (Originally 05/02/2018)  . DEXA SCAN  07/10/2018 (Originally 02/29/2000)  . TETANUS/TDAP  07/10/2018 (Originally 02/28/1952)  . PNA vac Low Risk Adult (1 of 2 - PCV13) 07/10/2018 (Originally 02/29/2000)    Allergies  Allergen Reactions  . Sulfa Antibiotics Other (See Comments)    "like pens and needles all over my body, tingling"--pt states she isnt sure if she is allergic to it because she was on another medication at the same time.     Facility-Administered Encounter Medications as of 06/10/2018  Medication  . bupivacaine liposome (EXPAREL) 1.3 % injection 266 mg   Outpatient Encounter Medications as of 06/10/2018  Medication Sig  . aspirin EC 325 MG EC tablet Take 1 tablet (325 mg total) by mouth daily with breakfast.  . clonazePAM (KLONOPIN) 0.5 MG tablet Take 0.5 tablets (0.25 mg total) by mouth at bedtime.  Marland Kitchen HYDROcodone-acetaminophen (NORCO/VICODIN) 5-325 MG tablet Take 1 tablet by mouth every 4 (four) hours as needed for moderate pain.  Marland Kitchen levothyroxine (SYNTHROID, LEVOTHROID) 112 MCG tablet Take 112 mcg by mouth daily before breakfast.  . losartan-hydrochlorothiazide (HYZAAR) 100-25 MG tablet Take 1 tablet by mouth daily.  . traZODone (DESYREL) 50 MG tablet Take 2 tablets (100 mg total) by mouth at bedtime.     Review of Systems  Review of Systems  Constitutional: Negative for  activity change, appetite change, chills, diaphoresis, fatigue and fever.  HENT: Negative for mouth sores, postnasal drip, rhinorrhea, sinus pain and sore throat.   Respiratory: Negative for apnea, cough, chest tightness, shortness of breath and wheezing.   Cardiovascular: Negative for chest pain, palpitations and leg swelling.  Gastrointestinal: Negative for abdominal distention, abdominal pain,  diarrhea, nausea and vomiting.  Genitourinary: Negative for dysuria and frequency.  Musculoskeletal: Negative for arthralgias, joint swelling and myalgias.  Skin: Negative for rash.  Neurological: Negative for dizziness, syncope, weakness, light-headedness and numbness.  Psychiatric/Behavioral: Negative for behavioral problems, confusion and sleep disturbance.     Vitals:   06/10/18 1103  BP: 121/72  Pulse: 73  Resp: 19  Temp: 97.8 F (36.6 C)  TempSrc: Oral   There is no height or weight on file to calculate BMI. Physical Exam Constitutional: Oriented to person, place, and time. Well-developed and well-nourished.  HENT:  Head: Normocephalic.  Mouth/Throat: Oropharynx is clear and moist.  Eyes: Pupils are equal, round, and reactive to light.  Neck: Neck supple.  Cardiovascular: Normal rate and normal heart sounds.  No murmur heard. Pulmonary/Chest: Effort normal and breath sounds normal. No respiratory distress. No wheezes. She has no rales.  Abdominal: Soft. Bowel sounds are normal. No distension. There is no tenderness. There is no rebound.  Musculoskeletal: Mild edema In right LE Lymphadenopathy: none Neurological: Alert and oriented to person, place, and time. No Focal deficits Skin: Skin is warm and dry.  Psychiatric: Normal mood and affect. Behavior is normal. Thought content normal.   Labs reviewed: Basic Metabolic Panel: Recent Labs    05/30/18 1412 06/05/18 0546 06/08/18 0830  NA 129* 132* 133*  K 3.7 3.8 3.8  CL 94* 98 94*  CO2 25 26 27   GLUCOSE 101* 106* 89    BUN 16 19 20   CREATININE 0.85 1.08* 0.96  CALCIUM 9.1 8.7* 9.1   Liver Function Tests: No results for input(s): AST, ALT, ALKPHOS, BILITOT, PROT, ALBUMIN in the last 8760 hours. No results for input(s): LIPASE, AMYLASE in the last 8760 hours. No results for input(s): AMMONIA in the last 8760 hours. CBC: Recent Labs    05/30/18 1412  06/06/18 0549 06/07/18 0508 06/08/18 0830  WBC 7.3   < > 10.5 8.6 9.2  NEUTROABS 4.2  --   --   --  6.5  HGB 12.0   < > 10.7* 10.4* 11.3*  HCT 34.8*   < > 31.5* 29.7* 33.5*  MCV 88.1   < > 89.7 89.5 89.8  PLT 240   < > 231 209 242   < > = values in this interval not displayed.   Cardiac Enzymes: No results for input(s): CKTOTAL, CKMB, CKMBINDEX, TROPONINI in the last 8760 hours. BNP: Invalid input(s): POCBNP No results found for: HGBA1C No results found for: TSH No results found for: VITAMINB12 No results found for: FOLATE No results found for: IRON, TIBC, FERRITIN  Imaging and Procedures obtained prior to SNF admission: No results found.  Assessment/Plan  S/P Right  total knee arthroplasty Patient is doing well with therapy Pain is controlled with hydrocodone Patient plans to go home in few days and do home therapy  Essential hypertension Pressure controlled on losartan and HCTZ Repeat BMP Hypothyroidism On levothyroxine Follow with her PCP  Anxiety Continue on Klonopin and trazodone Constipation  We will start her on MiraLAX    Family/ staff Communication:   Labs/tests ordered:  Total time spent in this patient care encounter was 45_ minutes; greater than 50% of the visit spent counseling patient, reviewing records , Labs and coordinating care for problems addressed at this encounter.

## 2018-06-12 LAB — TYPE AND SCREEN
ABO/RH(D): A NEG
Antibody Screen: POSITIVE
UNIT DIVISION: 0
Unit division: 0

## 2018-06-12 LAB — BPAM RBC
BLOOD PRODUCT EXPIRATION DATE: 201909212359
Unit Type and Rh: 600

## 2018-06-13 ENCOUNTER — Non-Acute Institutional Stay (SKILLED_NURSING_FACILITY): Payer: Medicare Other | Admitting: Internal Medicine

## 2018-06-13 ENCOUNTER — Encounter: Payer: Self-pay | Admitting: Internal Medicine

## 2018-06-13 ENCOUNTER — Encounter (HOSPITAL_COMMUNITY)
Admission: RE | Admit: 2018-06-13 | Discharge: 2018-06-13 | Disposition: A | Discharge status: Home / Self Care | Payer: Medicare Other | Source: Skilled Nursing Facility | Attending: Internal Medicine | Admitting: Internal Medicine

## 2018-06-13 DIAGNOSIS — E039 Hypothyroidism, unspecified: Secondary | ICD-10-CM | POA: Diagnosis not present

## 2018-06-13 DIAGNOSIS — I10 Essential (primary) hypertension: Secondary | ICD-10-CM | POA: Insufficient documentation

## 2018-06-13 DIAGNOSIS — F419 Anxiety disorder, unspecified: Secondary | ICD-10-CM

## 2018-06-13 DIAGNOSIS — Z96641 Presence of right artificial hip joint: Secondary | ICD-10-CM | POA: Insufficient documentation

## 2018-06-13 DIAGNOSIS — Z471 Aftercare following joint replacement surgery: Secondary | ICD-10-CM | POA: Insufficient documentation

## 2018-06-13 DIAGNOSIS — Z96651 Presence of right artificial knee joint: Secondary | ICD-10-CM

## 2018-06-13 LAB — BASIC METABOLIC PANEL
Anion gap: 11 (ref 5–15)
BUN: 19 mg/dL (ref 8–23)
CHLORIDE: 96 mmol/L — AB (ref 98–111)
CO2: 26 mmol/L (ref 22–32)
CREATININE: 1.16 mg/dL — AB (ref 0.44–1.00)
Calcium: 9.4 mg/dL (ref 8.9–10.3)
GFR calc Af Amer: 49 mL/min — ABNORMAL LOW (ref >60)
GFR calc non Af Amer: 42 mL/min — ABNORMAL LOW (ref >60)
Glucose, Bld: 112 mg/dL — ABNORMAL HIGH (ref 70–99)
Potassium: 3.3 mmol/L — ABNORMAL LOW (ref 3.5–5.1)
SODIUM: 133 mmol/L — AB (ref 135–145)

## 2018-06-13 LAB — CBC
HCT: 32.3 % — ABNORMAL LOW (ref 36.0–46.0)
HEMOGLOBIN: 11 g/dL — AB (ref 12.0–15.0)
MCH: 30.6 pg (ref 26.0–34.0)
MCHC: 34.1 g/dL (ref 30.0–36.0)
MCV: 89.7 fL (ref 78.0–100.0)
PLATELETS: 313 10*3/uL (ref 150–400)
RBC: 3.6 MIL/uL — ABNORMAL LOW (ref 3.87–5.11)
RDW: 13 % (ref 11.5–15.5)
WBC: 9.3 10*3/uL (ref 4.0–10.5)

## 2018-06-13 NOTE — Progress Notes (Signed)
Location:    [facility] Room Number: 158/P Place of Service:  SNF [phone])  Provider: Einar Crow MD  PCP: Carylon Perches, MD Patient Care Team: Carylon Perches, MD as PCP - General (Internal Medicine)  Extended Emergency Contact Information Primary Emergency Contact: Janina Mayo, [place] Home Phone: [phone] Mobile Phone: [phone] Relation: Daughter Secondary Emergency Contact: Gwenette Greet, [place] [address] [place] Home Phone: [phone] Mobile Phone: [phone] Relation: Daughter  Code Status: Full Code Goals of care:  Advanced Directive information Advanced Directives 06/13/2018  Does Patient Have a Medical Advance Directive? Yes  Type of Advance Directive (No Data)  Does patient want to make changes to medical advance directive? No - Patient declined  Copy of Healthcare Power of Attorney in Chart? No - copy requested  Would patient like information on creating a medical advance directive? -     Allergies  Allergen Reactions  . Sulfa Antibiotics Other (See Comments)    "like pens and needles all over my body, tingling"--pt states she isnt sure if she is allergic to it because she was on another medication at the same time.   . Sulfasalazine     "like pens and needles all over my body, tingling"--pt states she isnt sure if she is allergic to it because she was on another medication at the same time.     Chief Complaint  Patient presents with  . Discharge Note    Discharge Visit    HPI:  84 y.o. female  Who was seen today for discharge from the facility.  Patient was admitted to SNF after staying in the hospital from 09/03-09/06 for TKA. Patient has a history of hypertension, hypothyroidism, arthritis and anxiety She was admitted for TKA in the hospital.  She underwent the procedure on 09/03.  Her postop course was on uneventful. The facility patient worked with therapy and  is now able to do her transfers.  She is weightbearing and walking with a walker. She lives by herself but her sister is going to come and help her at home Patient was independent and driving before surgery  Past Medical History:  Diagnosis Date  . Anxiety   . Arthritis   . Hypertension   . Hypothyroidism   . Thyroid disease     Past Surgical History:  Procedure Laterality Date  . BREAST SURGERY  1993   L BREAST BX - BENIGN  . HIP CLOSED REDUCTION Right 06/20/2015   Procedure: CLOSED REDUCTION RIGHT HIP;  Surgeon: Vickki Hearing, MD;  Location: [facility];  Service: Orthopedics;  Laterality: Right;  . TONSILLECTOMY    . TOTAL HIP ARTHROPLASTY Right 05/12/2015   Procedure: RIGHT TOTAL HIP ARTHROPLASTY;  Surgeon: Ranee Gosselin, MD;  Location: [facility];  Service: Orthopedics;  Laterality: Right;  . TOTAL HIP ARTHROPLASTY Left 08/17/2016   Procedure: TOTAL HIP ARTHROPLASTY;  Surgeon: Vickki Hearing, MD;  Location: [facility];  Service: Orthopedics;  Laterality: Left;  . TOTAL KNEE ARTHROPLASTY Right 06/04/2018   Procedure: TOTAL KNEE ARTHROPLASTY;  Surgeon: Vickki Hearing, MD;  Location: [facility];  Service: Orthopedics;  Laterality: Right;  . TUBAL LIGATION        reports that she has never smoked. She has never used smokeless tobacco. She reports that she drinks alcohol. She reports that she does not use drugs. Social History  Socioeconomic History  . Marital status: Married    Spouse name: Not on file  . Number of children: Not on file  . Years of education: Not on file  . Highest education level: Not on file  Occupational History  . Not on file  Social Needs  . Financial resource strain: Not on file  . Food insecurity:    Worry: Not on file    Inability: Not on file  . Transportation needs:    Medical: Not on file    Non-medical: Not on file  Tobacco Use  . Smoking status: Never Smoker  . Smokeless tobacco: Never Used  Substance and Sexual Activity  . Alcohol use:  Yes    Comment: RARE  . Drug use: No  . Sexual activity: Not Currently    Birth control/protection: None  Lifestyle  . Physical activity:    Days per week: Not on file    Minutes per session: Not on file  . Stress: Not on file  Relationships  . Social connections:    Talks on phone: Not on file    Gets together: Not on file    Attends religious service: Not on file    Active member of club or organization: Not on file    Attends meetings of clubs or organizations: Not on file    Relationship status: Not on file  . Intimate partner violence:    Fear of current or ex partner: Not on file    Emotionally abused: Not on file    Physically abused: Not on file    Forced sexual activity: Not on file  Other Topics Concern  . Not on file  Social History Narrative  . Not on file   Functional Status Survey:    Allergies  Allergen Reactions  . Sulfa Antibiotics Other (See Comments)    "like pens and needles all over my body, tingling"--pt states she isnt sure if she is allergic to it because she was on another medication at the same time.   . Sulfasalazine     "like pens and needles all over my body, tingling"--pt states she isnt sure if she is allergic to it because she was on another medication at the same time.     Pertinent  Health Maintenance Due  Topic Date Due  . INFLUENZA VACCINE  07/10/2018 (Originally 05/02/2018)  . DEXA SCAN  07/10/2018 (Originally 02/29/2000)  . PNA vac Low Risk Adult (1 of 2 - PCV13) 07/10/2018 (Originally 02/29/2000)    Medications: Facility-Administered Encounter Medications as of 06/13/2018  Medication  . bupivacaine liposome (EXPAREL) 1.3 % injection 266 mg   Outpatient Encounter Medications as of 06/13/2018  Medication Sig  . aspirin EC 325 MG EC tablet Take 1 tablet (325 mg total) by mouth daily with breakfast.  . clonazePAM (KLONOPIN) 0.5 MG tablet Take 0.5 tablets (0.25 mg total) by mouth at bedtime.  Marland Kitchen HYDROcodone-acetaminophen  (NORCO/VICODIN) 5-325 MG tablet Take 1 tablet by mouth every 4 (four) hours as needed for moderate pain.  Marland Kitchen levothyroxine (SYNTHROID, LEVOTHROID) 112 MCG tablet Take 112 mcg by mouth daily before breakfast.  . losartan-hydrochlorothiazide (HYZAAR) 100-25 MG tablet Take 1 tablet by mouth daily.  . polyethylene glycol (MIRALAX / GLYCOLAX) packet Take 17 g by mouth daily.  . traZODone (DESYREL) 50 MG tablet Take 2 tablets (100 mg total) by mouth at bedtime.    Review of Systems  Vitals:   06/13/18 1530  BP: 130/68  Pulse: 72  Resp: 18  Temp: 97.8 F (36.6 C)  TempSrc: Oral   There is no height or weight on file to calculate BMI. Physical Exam  Constitutional: Oriented to person, place, and time. Well-developed and well-nourished.  HENT:  Head: Normocephalic.  Mouth/Throat: Oropharynx is clear and moist.  Eyes: Pupils are equal, round, and reactive to light.  Neck: Neck supple.  Cardiovascular: Normal rate and normal heart sounds.  No murmur heard. Pulmonary/Chest: Effort normal and breath sounds normal. No respiratory distress. No wheezes. She has no rales.  Abdominal: Soft. Bowel sounds are normal. No distension. There is no tenderness. There is no rebound.  Musculoskeletal: Mild Edema in Right LE Lymphadenopathy: none Neurological: Alert and oriented to person, place, and time.  Skin: Skin is warm and dry.  Psychiatric: Normal mood and affect. Behavior is normal. Thought content normal.    Labs reviewed: Basic Metabolic Panel: Recent Labs    06/05/18 0546 06/08/18 0830 06/13/18 0700  NA 132* 133* 133*  K 3.8 3.8 3.3*  CL 98 94* 96*  CO2 26 27 26   GLUCOSE 106* 89 112*  BUN 19 20 19   CREATININE 1.08* 0.96 1.16*  CALCIUM 8.7* 9.1 9.4   Liver Function Tests: No results for input(s): AST, ALT, ALKPHOS, BILITOT, PROT, ALBUMIN in the last 8760 hours. No results for input(s): LIPASE, AMYLASE in the last 8760 hours. No results for input(s): AMMONIA in the last 8760  hours. CBC: Recent Labs    05/30/18 1412  06/07/18 0508 06/08/18 0830 06/13/18 0700  WBC 7.3   < > 8.6 9.2 9.3  NEUTROABS 4.2  --   --  6.5  --   HGB 12.0   < > 10.4* 11.3* 11.0*  HCT 34.8*   < > 29.7* 33.5* 32.3*  MCV 88.1   < > 89.5 89.8 89.7  PLT 240   < > 209 242 313   < > = values in this interval not displayed.   Cardiac Enzymes: No results for input(s): CKTOTAL, CKMB, CKMBINDEX, TROPONINI in the last 8760 hours. BNP: Invalid input(s): POCBNP CBG: No results for input(s): GLUCAP in the last 8760 hours.  Procedures and Imaging Studies During Stay: Dg Knee Right Port  Result Date: 06/04/2018 CLINICAL DATA:  Status post right knee replacement. EXAM: PORTABLE RIGHT KNEE - 1-2 VIEW COMPARISON:  Radiographs of March 27, 2018. FINDINGS: The femoral and tibial components are well situated. Expected postsurgical changes are seen in the soft tissues anteriorly. No fracture or dislocation is noted. IMPRESSION: Status post right total knee arthroplasty. Electronically Signed   By: Lupita RaiderJames  Green Jr, M.D.   On: 06/04/2018 12:30    Assessment/Plan:   S/P Right  total knee arthroplasty Patient is doing well with therapy. She will Discharged home with her sister  Follow up with Ortho and PCP Pain is controlled with hydrocodone Essential hypertension Pressure controlled on losartan and HCTZ Will Supplement Potassium Hypothyroidism On levothyroxine Follow with her PCP Anxiety Continue on Klonopin and trazodone Constipation   started her on MiraLAX PRN  Future labs/tests needed:   Follow Up Bmp with PCP for Hypokalemia Follow up with Ortho for Suture removal  Patient also will have Home health and Therapy follow her at home.

## 2018-06-17 DIAGNOSIS — Z96651 Presence of right artificial knee joint: Secondary | ICD-10-CM | POA: Diagnosis not present

## 2018-06-17 DIAGNOSIS — I10 Essential (primary) hypertension: Secondary | ICD-10-CM | POA: Diagnosis not present

## 2018-06-17 DIAGNOSIS — Z471 Aftercare following joint replacement surgery: Secondary | ICD-10-CM | POA: Diagnosis not present

## 2018-06-17 DIAGNOSIS — E039 Hypothyroidism, unspecified: Secondary | ICD-10-CM | POA: Diagnosis not present

## 2018-06-20 DIAGNOSIS — E039 Hypothyroidism, unspecified: Secondary | ICD-10-CM | POA: Diagnosis not present

## 2018-06-20 DIAGNOSIS — I10 Essential (primary) hypertension: Secondary | ICD-10-CM | POA: Diagnosis not present

## 2018-06-20 DIAGNOSIS — Z96651 Presence of right artificial knee joint: Secondary | ICD-10-CM | POA: Diagnosis not present

## 2018-06-20 DIAGNOSIS — Z471 Aftercare following joint replacement surgery: Secondary | ICD-10-CM | POA: Diagnosis not present

## 2018-06-20 DIAGNOSIS — D62 Acute posthemorrhagic anemia: Secondary | ICD-10-CM | POA: Diagnosis not present

## 2018-06-21 ENCOUNTER — Encounter: Payer: Self-pay | Admitting: Orthopedic Surgery

## 2018-06-21 ENCOUNTER — Ambulatory Visit (INDEPENDENT_AMBULATORY_CARE_PROVIDER_SITE_OTHER): Payer: Medicare Other | Admitting: Orthopedic Surgery

## 2018-06-21 VITALS — BP 153/79 | HR 88 | Ht 65.0 in | Wt 155.0 lb

## 2018-06-21 DIAGNOSIS — Z96651 Presence of right artificial knee joint: Secondary | ICD-10-CM | POA: Diagnosis not present

## 2018-06-21 DIAGNOSIS — I10 Essential (primary) hypertension: Secondary | ICD-10-CM | POA: Diagnosis not present

## 2018-06-21 DIAGNOSIS — Z471 Aftercare following joint replacement surgery: Secondary | ICD-10-CM | POA: Diagnosis not present

## 2018-06-21 DIAGNOSIS — E039 Hypothyroidism, unspecified: Secondary | ICD-10-CM | POA: Diagnosis not present

## 2018-06-21 NOTE — Progress Notes (Signed)
Chief Complaint  Patient presents with  . Knee Pain    Right knee DOS 06/04/18    Postop day 17 right total knee  Wound is clean dry and intact staples are removed  Patient range of motion is 3- 85 degrees in the office 94 on PT report  Continue exercises follow-up in 4 weeks seems to be doing well

## 2018-06-24 DIAGNOSIS — Z96651 Presence of right artificial knee joint: Secondary | ICD-10-CM | POA: Diagnosis not present

## 2018-06-24 DIAGNOSIS — Z471 Aftercare following joint replacement surgery: Secondary | ICD-10-CM | POA: Diagnosis not present

## 2018-06-24 DIAGNOSIS — E039 Hypothyroidism, unspecified: Secondary | ICD-10-CM | POA: Diagnosis not present

## 2018-06-24 DIAGNOSIS — I10 Essential (primary) hypertension: Secondary | ICD-10-CM | POA: Diagnosis not present

## 2018-06-26 DIAGNOSIS — Z96651 Presence of right artificial knee joint: Secondary | ICD-10-CM | POA: Diagnosis not present

## 2018-06-26 DIAGNOSIS — E039 Hypothyroidism, unspecified: Secondary | ICD-10-CM | POA: Diagnosis not present

## 2018-06-26 DIAGNOSIS — Z471 Aftercare following joint replacement surgery: Secondary | ICD-10-CM | POA: Diagnosis not present

## 2018-06-26 DIAGNOSIS — I10 Essential (primary) hypertension: Secondary | ICD-10-CM | POA: Diagnosis not present

## 2018-06-28 DIAGNOSIS — I10 Essential (primary) hypertension: Secondary | ICD-10-CM | POA: Diagnosis not present

## 2018-06-28 DIAGNOSIS — E039 Hypothyroidism, unspecified: Secondary | ICD-10-CM | POA: Diagnosis not present

## 2018-06-28 DIAGNOSIS — Z471 Aftercare following joint replacement surgery: Secondary | ICD-10-CM | POA: Diagnosis not present

## 2018-06-28 DIAGNOSIS — Z96651 Presence of right artificial knee joint: Secondary | ICD-10-CM | POA: Diagnosis not present

## 2018-07-19 ENCOUNTER — Encounter: Payer: Self-pay | Admitting: Orthopedic Surgery

## 2018-07-19 ENCOUNTER — Ambulatory Visit (INDEPENDENT_AMBULATORY_CARE_PROVIDER_SITE_OTHER): Payer: Medicare Other | Admitting: Orthopedic Surgery

## 2018-07-19 VITALS — BP 152/78 | HR 76 | Ht 65.0 in | Wt 155.0 lb

## 2018-07-19 DIAGNOSIS — Z96651 Presence of right artificial knee joint: Secondary | ICD-10-CM

## 2018-07-19 DIAGNOSIS — Z471 Aftercare following joint replacement surgery: Secondary | ICD-10-CM

## 2018-07-19 NOTE — Progress Notes (Signed)
Chief Complaint  Patient presents with  . Knee Pain    Right knee DOS 06/06/18    This is postoperative day #43  Status post right total knee  The patient says her pain is excellent she has a little swelling she has completed physical therapy her flexion is 115 degrees  I will address the concerns with physical therapy about valgus position of the knee  Normal alignment of the knee is valgus.  She has about 7 degrees.  She also has internal rotation contracture of her hip status post hip replacement multiple dislocations requiring close reduction  I am not concerned with the position of the leg.  She is doing well.  Follow-up in 6 weeks  Encounter Diagnoses  Name Primary?  . S/P total knee replacement, right 06/04/18 Yes  . Aftercare following right knee joint replacement surgery

## 2018-08-22 DIAGNOSIS — Z23 Encounter for immunization: Secondary | ICD-10-CM | POA: Diagnosis not present

## 2018-09-02 ENCOUNTER — Ambulatory Visit: Payer: Medicare Other | Admitting: Orthopedic Surgery

## 2018-09-04 ENCOUNTER — Encounter: Payer: Self-pay | Admitting: Orthopedic Surgery

## 2018-09-04 ENCOUNTER — Ambulatory Visit (INDEPENDENT_AMBULATORY_CARE_PROVIDER_SITE_OTHER): Payer: Medicare Other | Admitting: Orthopedic Surgery

## 2018-09-04 ENCOUNTER — Ambulatory Visit (INDEPENDENT_AMBULATORY_CARE_PROVIDER_SITE_OTHER): Payer: Medicare Other

## 2018-09-04 VITALS — BP 146/89 | HR 80 | Ht 65.0 in | Wt 155.0 lb

## 2018-09-04 DIAGNOSIS — Z96651 Presence of right artificial knee joint: Secondary | ICD-10-CM | POA: Diagnosis not present

## 2018-09-04 DIAGNOSIS — Z96642 Presence of left artificial hip joint: Secondary | ICD-10-CM | POA: Diagnosis not present

## 2018-09-04 NOTE — Patient Instructions (Signed)
Place patient on yearly follow-up x-ray pelvis both hips and right knee one visit starting December 2020

## 2018-09-04 NOTE — Progress Notes (Signed)
Established patient follow-up regarding hip replacement  Chief Complaint  Patient presents with  . Post-op Follow-up    right total knee 06/04/18 feels well / yearly check on left hip s/p THR 08/17/16    84 year old female status post left total hip replacement 2 years ago seems to be doing well  Review of systems no major new complaints related to the left total hip in terms of pain numbness tingling, occasional back pain chronic  BP (!) 146/89   Pulse 80   Ht 5\' 5"  (1.651 m)   Wt 155 lb (70.3 kg)   BMI 25.79 kg/m   Physical Exam  Constitutional: She is oriented to person, place, and time. She appears well-developed and well-nourished.  Neurological: She is alert and oriented to person, place, and time.  Psychiatric: She has a normal mood and affect. Her behavior is normal.   Right knee incision looks good flexion arc is 125 degrees knee is stable quadricep strength looks great  Bilateral hips normal flexion without pain or tenderness  Gait pelvic obliquity related to lumbar disease currently ambulating with a cane  Encounter Diagnoses  Name Primary?  . S/P total knee replacement, right 06/04/18 Yes  . History of total left hip replacement 08/07/16     Follow-up 1 year x-ray right knee x-ray pelvis x-ray right and left hip

## 2018-11-06 DIAGNOSIS — Z961 Presence of intraocular lens: Secondary | ICD-10-CM | POA: Diagnosis not present

## 2019-05-13 DIAGNOSIS — N183 Chronic kidney disease, stage 3 (moderate): Secondary | ICD-10-CM | POA: Diagnosis not present

## 2019-05-13 DIAGNOSIS — M1990 Unspecified osteoarthritis, unspecified site: Secondary | ICD-10-CM | POA: Diagnosis not present

## 2019-05-13 DIAGNOSIS — E039 Hypothyroidism, unspecified: Secondary | ICD-10-CM | POA: Diagnosis not present

## 2019-05-13 DIAGNOSIS — Z79899 Other long term (current) drug therapy: Secondary | ICD-10-CM | POA: Diagnosis not present

## 2019-05-13 DIAGNOSIS — I10 Essential (primary) hypertension: Secondary | ICD-10-CM | POA: Diagnosis not present

## 2019-05-20 DIAGNOSIS — I129 Hypertensive chronic kidney disease with stage 1 through stage 4 chronic kidney disease, or unspecified chronic kidney disease: Secondary | ICD-10-CM | POA: Diagnosis not present

## 2019-05-20 DIAGNOSIS — N183 Chronic kidney disease, stage 3 (moderate): Secondary | ICD-10-CM | POA: Diagnosis not present

## 2019-05-20 DIAGNOSIS — E876 Hypokalemia: Secondary | ICD-10-CM | POA: Diagnosis not present

## 2019-05-20 DIAGNOSIS — E039 Hypothyroidism, unspecified: Secondary | ICD-10-CM | POA: Diagnosis not present

## 2019-05-20 DIAGNOSIS — E785 Hyperlipidemia, unspecified: Secondary | ICD-10-CM | POA: Diagnosis not present

## 2019-07-01 DIAGNOSIS — Z23 Encounter for immunization: Secondary | ICD-10-CM | POA: Diagnosis not present

## 2019-07-16 DIAGNOSIS — S42031A Displaced fracture of lateral end of right clavicle, initial encounter for closed fracture: Secondary | ICD-10-CM | POA: Diagnosis not present

## 2019-07-17 DIAGNOSIS — S42031A Displaced fracture of lateral end of right clavicle, initial encounter for closed fracture: Secondary | ICD-10-CM | POA: Diagnosis not present

## 2019-07-17 DIAGNOSIS — M25511 Pain in right shoulder: Secondary | ICD-10-CM | POA: Diagnosis not present

## 2019-08-21 ENCOUNTER — Other Ambulatory Visit: Payer: Self-pay

## 2019-08-21 DIAGNOSIS — Z20822 Contact with and (suspected) exposure to covid-19: Secondary | ICD-10-CM

## 2019-08-24 LAB — NOVEL CORONAVIRUS, NAA: SARS-CoV-2, NAA: NOT DETECTED

## 2019-08-27 ENCOUNTER — Telehealth: Payer: Self-pay | Admitting: *Deleted

## 2019-08-27 NOTE — Telephone Encounter (Signed)
Patient is calling for COVID result- notified negative for COVID.

## 2019-09-01 DIAGNOSIS — S42031K Displaced fracture of lateral end of right clavicle, subsequent encounter for fracture with nonunion: Secondary | ICD-10-CM | POA: Diagnosis not present

## 2019-09-01 DIAGNOSIS — M19012 Primary osteoarthritis, left shoulder: Secondary | ICD-10-CM | POA: Diagnosis not present

## 2019-09-08 ENCOUNTER — Ambulatory Visit: Payer: Self-pay | Admitting: Orthopedic Surgery

## 2019-10-22 ENCOUNTER — Ambulatory Visit: Payer: Medicare Other | Admitting: Orthopedic Surgery

## 2019-10-22 ENCOUNTER — Encounter: Payer: Self-pay | Admitting: Orthopedic Surgery

## 2019-11-13 DIAGNOSIS — I10 Essential (primary) hypertension: Secondary | ICD-10-CM | POA: Diagnosis not present

## 2019-11-13 DIAGNOSIS — Z79899 Other long term (current) drug therapy: Secondary | ICD-10-CM | POA: Diagnosis not present

## 2019-11-13 DIAGNOSIS — N183 Chronic kidney disease, stage 3 unspecified: Secondary | ICD-10-CM | POA: Diagnosis not present

## 2019-11-20 DIAGNOSIS — I10 Essential (primary) hypertension: Secondary | ICD-10-CM | POA: Diagnosis not present

## 2019-11-20 DIAGNOSIS — E876 Hypokalemia: Secondary | ICD-10-CM | POA: Diagnosis not present

## 2019-11-20 DIAGNOSIS — N1831 Chronic kidney disease, stage 3a: Secondary | ICD-10-CM | POA: Diagnosis not present

## 2020-01-20 DIAGNOSIS — N3000 Acute cystitis without hematuria: Secondary | ICD-10-CM | POA: Diagnosis not present

## 2020-01-20 DIAGNOSIS — N390 Urinary tract infection, site not specified: Secondary | ICD-10-CM | POA: Diagnosis not present

## 2020-03-05 ENCOUNTER — Ambulatory Visit (INDEPENDENT_AMBULATORY_CARE_PROVIDER_SITE_OTHER): Payer: Medicare Other | Admitting: Urology

## 2020-03-05 ENCOUNTER — Other Ambulatory Visit (HOSPITAL_COMMUNITY)
Admission: AD | Admit: 2020-03-05 | Discharge: 2020-03-05 | Disposition: A | Discharge status: Home / Self Care | Payer: Medicare Other | Source: Skilled Nursing Facility | Attending: Urology | Admitting: Urology

## 2020-03-05 ENCOUNTER — Encounter: Payer: Self-pay | Admitting: Urology

## 2020-03-05 ENCOUNTER — Other Ambulatory Visit: Payer: Self-pay

## 2020-03-05 VITALS — BP 199/83 | HR 72 | Temp 98.2°F | Ht 64.0 in | Wt 148.0 lb

## 2020-03-05 DIAGNOSIS — R351 Nocturia: Secondary | ICD-10-CM

## 2020-03-05 DIAGNOSIS — R350 Frequency of micturition: Secondary | ICD-10-CM

## 2020-03-05 DIAGNOSIS — R3912 Poor urinary stream: Secondary | ICD-10-CM

## 2020-03-05 DIAGNOSIS — R339 Retention of urine, unspecified: Secondary | ICD-10-CM | POA: Diagnosis not present

## 2020-03-05 LAB — URINALYSIS, COMPLETE (UACMP) WITH MICROSCOPIC
Bacteria, UA: NONE SEEN
Bilirubin Urine: NEGATIVE
Glucose, UA: NEGATIVE mg/dL
Hgb urine dipstick: NEGATIVE
Ketones, ur: NEGATIVE mg/dL
Leukocytes,Ua: NEGATIVE
Nitrite: NEGATIVE
Protein, ur: NEGATIVE mg/dL
Specific Gravity, Urine: 1.009 (ref 1.005–1.030)
pH: 7 (ref 5.0–8.0)

## 2020-03-05 LAB — POCT URINALYSIS DIPSTICK
Bilirubin, UA: NEGATIVE
Glucose, UA: NEGATIVE
Ketones, UA: NEGATIVE
Nitrite, UA: NEGATIVE
Protein, UA: POSITIVE — AB
Spec Grav, UA: <=1.005 — AB (ref 1.010–1.025)
Urobilinogen, UA: NEGATIVE E.U./dL — AB
pH, UA: 8.5 — AB (ref 5.0–8.0)

## 2020-03-05 NOTE — Progress Notes (Signed)
Urological Symptom Review  Patient is experiencing the following symptoms: Frequent urination Hard to postpone urination Get up at night to urinate Stream starts and stops Weak stream   Review of Systems  Gastrointestinal (upper)  : Negative for upper GI symptoms  Gastrointestinal (lower) : Constipation  Constitutional : Negative for symptoms  Skin: Negative for skin symptoms  Eyes: Negative for eye symptoms  Ear/Nose/Throat : Negative for Ear/Nose/Throat symptoms  Hematologic/Lymphatic: Negative for Hematologic/Lymphatic symptoms  Cardiovascular : Negative for cardiovascular symptoms  Respiratory : Negative for respiratory symptoms  Endocrine: Negative for endocrine symptoms  Musculoskeletal: Back pain  Neurological: Negative for neurological symptoms  Psychologic: Negative for psychiatric symptoms

## 2020-03-05 NOTE — Progress Notes (Signed)
Subjective: 1. Frequency of urination   2. Weak urinary stream   3. Nocturia   4. Incomplete bladder emptying     [name] is an 86 yo WF who is sent by Dr. Ouida Sills for progressive frequency and urgency over the last 18 months.  The frequency can range from every 15-20 minutes to 2hrs.   She has intermittency and the stream is slow.  She has nocturia up to 4x.  She has no stress or urge incontinence.  She has had no hematuria.  She has had no recent UTI's.  She was treated with an antibiotic recently but the culture was negative.   She has had no stones or GU surgery. She is G3P3 NVD3.  She has no prolapse symptoms.  She is constipated.  She uses a laxative prn.   ROS:  ROS  Allergies  Allergen Reactions  . Sulfa Antibiotics Other (See Comments)    "like pens and needles all over my body, tingling"--pt states she isnt sure if she is allergic to it because she was on another medication at the same time.   . Sulfasalazine     "like pens and needles all over my body, tingling"--pt states she isnt sure if she is allergic to it because she was on another medication at the same time.     Past Medical History:  Diagnosis Date  . Anxiety   . Arthritis   . Hypertension   . Hypothyroidism   . Thyroid disease     Past Surgical History:  Procedure Laterality Date  . BREAST SURGERY  1993   L BREAST BX - BENIGN  . HIP CLOSED REDUCTION Right 06/20/2015   Procedure: CLOSED REDUCTION RIGHT HIP;  Surgeon: Vickki Hearing, MD;  Location: [facility];  Service: Orthopedics;  Laterality: Right;  . TONSILLECTOMY    . TOTAL HIP ARTHROPLASTY Right 05/12/2015   Procedure: RIGHT TOTAL HIP ARTHROPLASTY;  Surgeon: Ranee Gosselin, MD;  Location: [facility];  Service: Orthopedics;  Laterality: Right;  . TOTAL HIP ARTHROPLASTY Left 08/17/2016   Procedure: TOTAL HIP ARTHROPLASTY;  Surgeon: Vickki Hearing, MD;  Location: [facility];  Service: Orthopedics;  Laterality: Left;  . TOTAL KNEE ARTHROPLASTY Right 06/04/2018    Procedure: TOTAL KNEE ARTHROPLASTY;  Surgeon: Vickki Hearing, MD;  Location: [facility];  Service: Orthopedics;  Laterality: Right;  . TUBAL LIGATION      Social History   Socioeconomic History  . Marital status: Married    Spouse name: Not on file  . Number of children: 3  . Years of education: Not on file  . Highest education level: Not on file  Occupational History  . Occupation: retired  Tobacco Use  . Smoking status: Never Smoker  . Smokeless tobacco: Never Used  Substance and Sexual Activity  . Alcohol use: Yes    Comment: RARE  . Drug use: No  . Sexual activity: Not Currently    Birth control/protection: None  Other Topics Concern  . Not on file  Social History Narrative  . Not on file   Social Determinants of Health   Financial Resource Strain:   . Difficulty of Paying Living Expenses:   Food Insecurity:   . Worried About Programme researcher, broadcasting/film/video in the Last Year:   . Barista in the Last Year:   Transportation Needs:   . Freight forwarder (Medical):   Marland Kitchen Lack of Transportation (Non-Medical):   Physical Activity:   . Days of Exercise per  Week:   . Minutes of Exercise per Session:   Stress:   . Feeling of Stress :   Social Connections:   . Frequency of Communication with Friends and Family:   . Frequency of Social Gatherings with Friends and Family:   . Attends Religious Services:   . Active Member of Clubs or Organizations:   . Attends Archivist Meetings:   Marland Kitchen Marital Status:   Intimate Partner Violence:   . Fear of Current or Ex-Partner:   . Emotionally Abused:   Marland Kitchen Physically Abused:   . Sexually Abused:     Family History  Problem Relation Age of Onset  . Congestive Heart Failure Mother   . Kidney failure Mother   . Stroke Father     Anti-infectives: Anti-infectives (From admission, onward)   None      Current Outpatient Medications  Medication Sig Dispense Refill  . aspirin EC 325 MG EC tablet Take 1 tablet (325 mg  total) by mouth daily with breakfast. 30 tablet 0  . clonazePAM (KLONOPIN) 0.5 MG tablet TAKE 1 2 TO 1 (ONE HALF TO ONE) TABLET BY MOUTH TWICE DAILY AS NEEDED    . HYDROcodone-acetaminophen (NORCO/VICODIN) 5-325 MG tablet Take 1 tablet by mouth every 4 (four) hours as needed for moderate pain. 60 tablet 0  . levothyroxine (EUTHYROX) 112 MCG tablet Take by mouth.    . losartan-hydrochlorothiazide (HYZAAR) 100-25 MG tablet Take 1 tablet by mouth daily.    . polyethylene glycol (MIRALAX / GLYCOLAX) packet Take 17 g by mouth daily.    . potassium chloride SA (KLOR-CON) 20 MEQ tablet Take by mouth.    . traZODone (DESYREL) 50 MG tablet Take 2 tablets (100 mg total) by mouth at bedtime. 21 tablet 0  . hydrochlorothiazide (HYDRODIURIL) 25 MG tablet Take 25 mg by mouth daily.    Marland Kitchen losartan (COZAAR) 100 MG tablet Take 100 mg by mouth daily.     No current facility-administered medications for this visit.   Facility-Administered Medications Ordered in Other Visits  Medication Dose Route Frequency Provider Last Rate Last Admin  . bupivacaine liposome (EXPAREL) 1.3 % injection 266 mg  20 mL Infiltration Once Cecilio Asper, [employer], PA-C         Objective: Vital signs in last 24 hours: BP (!) 199/83   Pulse 72   Temp 98.2 F (36.8 C)   Ht 5\' 4"  (1.626 m)   Wt 148 lb (67.1 kg)   BMI 25.40 kg/m      Physical Exam Vitals reviewed.  Constitutional:      Appearance: Normal appearance.  Cardiovascular:     Rate and Rhythm: Normal rate and regular rhythm.     Heart sounds: Normal heart sounds.  Pulmonary:     Effort: Pulmonary effort is normal. No respiratory distress.     Breath sounds: Normal breath sounds.  Abdominal:     General: Abdomen is flat.     Palpations: Abdomen is soft.     Tenderness: There is no abdominal tenderness.  Genitourinary:    Comments: Nl External genitalia. Mild introital stenosis with moderate to severe atrophic vaginitis. Slight urethra retraction without stenosis.   No urethral hypermobility. Small cystocele.  No rectocele.  Musculoskeletal:        General: No swelling or tenderness. Normal range of motion.     Cervical back: Normal range of motion and neck supple.  Skin:    General: Skin is warm and dry.  Neurological:  General: No focal deficit present.     Mental Status: She is alert and oriented to person, place, and time.  Psychiatric:        Mood and Affect: Mood normal.        Behavior: Behavior normal.     Lab Results:  Results for orders placed or performed in visit on 03/05/20 (from the past 24 hour(s))  POCT urinalysis dipstick     Status: Abnormal   Collection Time: 03/05/20  3:43 PM  Result Value Ref Range   Color, UA lt yellow    Clarity, UA clear    Glucose, UA Negative Negative   Bilirubin, UA neg    Ketones, UA neg    Spec Grav, UA <=1.005 (A) 1.010 - 1.025   Blood, UA intact    pH, UA 8.5 (A) 5.0 - 8.0   Protein, UA Positive (A) Negative   Urobilinogen, UA negative (A) 0.2 or 1.0 E.U./dL   Nitrite, UA neg    Leukocytes, UA Large (3+) (A) Negative   Appearance clear    Odor         Studies/Results: Records and labs from Dr. Ouida Sills reviewed.    Assessment/Plan: She has frequency, urgency, nocturia and incomplete bladder emptying with vaginal atrophy but not mimimal prolapse.  I have recommended she reduce her tea intake and I would like to have her see our PT team in [place].    She had pyuria on her voided urine.  The cathed specimen looks clear but I will send it for UA micro and culture.     No orders of the defined types were placed in this encounter.    Orders Placed This Encounter  Procedures  . Urine Culture    Standing Status:   Future    Standing Expiration Date:   05/05/2020  . Urinalysis, Routine w reflex microscopic    Standing Status:   Future    Standing Expiration Date:   05/05/2020  . Ambulatory referral to Urology    Referral Priority:   Routine    Referral Type:   Consultation     Referral Reason:   Specialty Services Required    Referred to Provider:   Bjorn Pippin, MD    Requested Specialty:   Urology    Number of Visits Requested:   1  . POCT urinalysis dipstick     Return in about 6 weeks (around 04/16/2020) for 6-8 weeks for possible cystoscopy.   .    CC: Dr. Carylon Perches.      Bjorn Pippin 03/05/2020 [phone]

## 2020-03-07 LAB — URINE CULTURE: Culture: NO GROWTH

## 2020-03-18 ENCOUNTER — Telehealth: Payer: Self-pay

## 2020-03-22 NOTE — Telephone Encounter (Signed)
Returned call to pt and pt made aware of culture results.

## 2020-03-23 ENCOUNTER — Telehealth: Payer: Self-pay

## 2020-04-01 DIAGNOSIS — H6123 Impacted cerumen, bilateral: Secondary | ICD-10-CM | POA: Diagnosis not present

## 2020-04-01 DIAGNOSIS — H60391 Other infective otitis externa, right ear: Secondary | ICD-10-CM | POA: Diagnosis not present

## 2020-04-07 DIAGNOSIS — H905 Unspecified sensorineural hearing loss: Secondary | ICD-10-CM | POA: Diagnosis not present

## 2020-05-06 NOTE — Progress Notes (Signed)
Subjective: 1. Frequency of urination   2. Incomplete bladder emptying      [name] returns today in f/u.   She cut out tea but didn't notice and improvement in her symptoms.   She was unable to go for PT in [place].   She continues to have variable frequency from q1hr to 2.5 hrs.  She has improved nocturia with evening fluid restriction.  She has nocturia 0-1x.   GU Hx: [name] is an 86 yo WF who is sent by Dr. Ouida Sills for progressive frequency and urgency over the last 18 months.  The frequency can range from every 15-20 minutes to 2hrs.   She has intermittency and the stream is slow.  She has nocturia up to 4x.  She has no stress or urge incontinence.  She has had no hematuria.  She has had no recent UTI's.  She was treated with an antibiotic recently but the culture was negative.   She has had no stones or GU surgery. She is G3P3 NVD3.  She has no prolapse symptoms.  She is constipated.  She uses a laxative prn.   ROS:  Review of Systems  Musculoskeletal: Positive for back pain.    Allergies  Allergen Reactions  . Sulfa Antibiotics Other (See Comments)    "like pens and needles all over my body, tingling"--pt states she isnt sure if she is allergic to it because she was on another medication at the same time.   . Sulfasalazine     "like pens and needles all over my body, tingling"--pt states she isnt sure if she is allergic to it because she was on another medication at the same time.     Past Medical History:  Diagnosis Date  . Anxiety   . Arthritis   . Hypertension   . Hypothyroidism   . Thyroid disease     Past Surgical History:  Procedure Laterality Date  . BREAST SURGERY  1993   L BREAST BX - BENIGN  . HIP CLOSED REDUCTION Right 06/20/2015   Procedure: CLOSED REDUCTION RIGHT HIP;  Surgeon: Vickki Hearing, MD;  Location: [facility];  Service: Orthopedics;  Laterality: Right;  . TONSILLECTOMY    . TOTAL HIP ARTHROPLASTY Right 05/12/2015   Procedure: RIGHT TOTAL HIP  ARTHROPLASTY;  Surgeon: Ranee Gosselin, MD;  Location: [facility];  Service: Orthopedics;  Laterality: Right;  . TOTAL HIP ARTHROPLASTY Left 08/17/2016   Procedure: TOTAL HIP ARTHROPLASTY;  Surgeon: Vickki Hearing, MD;  Location: [facility];  Service: Orthopedics;  Laterality: Left;  . TOTAL KNEE ARTHROPLASTY Right 06/04/2018   Procedure: TOTAL KNEE ARTHROPLASTY;  Surgeon: Vickki Hearing, MD;  Location: [facility];  Service: Orthopedics;  Laterality: Right;  . TUBAL LIGATION      Social History   Socioeconomic History  . Marital status: Married    Spouse name: Not on file  . Number of children: 3  . Years of education: Not on file  . Highest education level: Not on file  Occupational History  . Occupation: retired  Tobacco Use  . Smoking status: Never Smoker  . Smokeless tobacco: Never Used  Vaping Use  . Vaping Use: Never used  Substance and Sexual Activity  . Alcohol use: Yes    Comment: RARE  . Drug use: No  . Sexual activity: Not Currently    Birth control/protection: None  Other Topics Concern  . Not on file  Social History Narrative  . Not on file   Social Determinants of  Health   Financial Resource Strain:   . Difficulty of Paying Living Expenses:   Food Insecurity:   . Worried About Programme researcher, broadcasting/film/video in the Last Year:   . Barista in the Last Year:   Transportation Needs:   . Freight forwarder (Medical):   Marland Kitchen Lack of Transportation (Non-Medical):   Physical Activity:   . Days of Exercise per Week:   . Minutes of Exercise per Session:   Stress:   . Feeling of Stress :   Social Connections:   . Frequency of Communication with Friends and Family:   . Frequency of Social Gatherings with Friends and Family:   . Attends Religious Services:   . Active Member of Clubs or Organizations:   . Attends Banker Meetings:   Marland Kitchen Marital Status:   Intimate Partner Violence:   . Fear of Current or Ex-Partner:   . Emotionally Abused:   Marland Kitchen Physically  Abused:   . Sexually Abused:     Family History  Problem Relation Age of Onset  . Congestive Heart Failure Mother   . Kidney failure Mother   . Stroke Father     Anti-infectives: Anti-infectives (From admission, onward)   None      Current Outpatient Medications  Medication Sig Dispense Refill  . aspirin EC 325 MG EC tablet Take 1 tablet (325 mg total) by mouth daily with breakfast. 30 tablet 0  . clonazePAM (KLONOPIN) 0.5 MG tablet TAKE 1 2 TO 1 (ONE HALF TO ONE) TABLET BY MOUTH TWICE DAILY AS NEEDED    . hydrochlorothiazide (HYDRODIURIL) 25 MG tablet Take 25 mg by mouth daily.    Marland Kitchen HYDROcodone-acetaminophen (NORCO/VICODIN) 5-325 MG tablet Take 1 tablet by mouth every 4 (four) hours as needed for moderate pain. 60 tablet 0  . levothyroxine (EUTHYROX) 112 MCG tablet Take by mouth.    . losartan (COZAAR) 100 MG tablet Take 100 mg by mouth daily.    Marland Kitchen losartan-hydrochlorothiazide (HYZAAR) 100-25 MG tablet Take 1 tablet by mouth daily.    . polyethylene glycol (MIRALAX / GLYCOLAX) packet Take 17 g by mouth daily.    . potassium chloride SA (KLOR-CON) 20 MEQ tablet Take by mouth.    . traZODone (DESYREL) 50 MG tablet Take 2 tablets (100 mg total) by mouth at bedtime. 21 tablet 0   No current facility-administered medications for this visit.   Facility-Administered Medications Ordered in Other Visits  Medication Dose Route Frequency Provider Last Rate Last Admin  . bupivacaine liposome (EXPAREL) 1.3 % injection 266 mg  20 mL Infiltration Once Celedonio Savage, [facility], PA-C         Objective: Vital signs in last 24 hours: BP (!) 181/85   Pulse 83   Temp 97.9 F (36.6 C)   Ht 5\' 4"  (1.626 m)   Wt 148 lb (67.1 kg)   BMI 25.40 kg/m       Physical Exam  Lab Results:  Urine culture from her last visit was negative.      Studies/Results: Records and labs from Dr. reviewed.    Assessment/Plan: She has had some improvement in the frequency, urgency, nocturia and  sensation of  incomplete bladder emptying with behavioral modification.   I will get a renal Ouida Sills to assess her PVR and upper tracts more clearly.    She has pyuria in her voided urine but her cath UA and  culture were negative at the last visit. No orders of the defined  types were placed in this encounter.    Orders Placed This Encounter  Procedures  . Microscopic Examination  . US RENAL    Standing Status:   Future    Standing Expiration Date:   06/07/2020    Order Specific Question:   Reason for Exam (SYMPTOM  OR DIAGNOSIS REQUIRED)    Answer:   Incomplete bladder emptying with some back pain.    Order Specific Question:   Preferred imaging location?    Answer:   [facility]  . Urinalysis, Routine w reflex microscopic     Return in about 3 months (around 08/07/2020).    CC: Dr. Carylon Perches.      Bjorn Pippin 05/07/2020 [phone]

## 2020-05-07 ENCOUNTER — Other Ambulatory Visit: Payer: Self-pay

## 2020-05-07 ENCOUNTER — Ambulatory Visit (INDEPENDENT_AMBULATORY_CARE_PROVIDER_SITE_OTHER): Payer: Medicare Other | Admitting: Urology

## 2020-05-07 ENCOUNTER — Other Ambulatory Visit: Payer: Medicare Other | Admitting: Urology

## 2020-05-07 ENCOUNTER — Encounter: Payer: Self-pay | Admitting: Urology

## 2020-05-07 VITALS — BP 181/85 | HR 83 | Temp 97.9°F | Ht 64.0 in | Wt 148.0 lb

## 2020-05-07 DIAGNOSIS — R350 Frequency of micturition: Secondary | ICD-10-CM | POA: Diagnosis not present

## 2020-05-07 DIAGNOSIS — R339 Retention of urine, unspecified: Secondary | ICD-10-CM | POA: Diagnosis not present

## 2020-05-07 LAB — URINALYSIS, ROUTINE W REFLEX MICROSCOPIC
Bilirubin, UA: NEGATIVE
Glucose, UA: NEGATIVE
Ketones, UA: NEGATIVE
Nitrite, UA: NEGATIVE
Protein,UA: NEGATIVE
RBC, UA: NEGATIVE
Specific Gravity, UA: 1.015 (ref 1.005–1.030)
Urobilinogen, Ur: 0.2 mg/dL (ref 0.2–1.0)
pH, UA: 7 (ref 5.0–7.5)

## 2020-05-07 LAB — MICROSCOPIC EXAMINATION
Bacteria, UA: NONE SEEN
RBC, Urine: NONE SEEN /hpf (ref 0–2)
WBC, UA: >30 /hpf — AB (ref 0–5)

## 2020-05-07 NOTE — Progress Notes (Signed)
Urological Symptom Review  Patient is experiencing the following symptoms: Frequent urination Get up at night to urinate Stream starts and stops Trouble starting stream   Review of Systems  Gastrointestinal (upper)  : Indigestion/heartburn  Gastrointestinal (lower) : Constipation  Constitutional : Negative for symptoms  Skin: Itching  Eyes: Negative for eye symptoms  Ear/Nose/Throat : Negative for Ear/Nose/Throat symptoms  Hematologic/Lymphatic: Negative for Hematologic/Lymphatic symptoms  Cardiovascular : Negative for cardiovascular symptoms  Respiratory : Cough  Endocrine: Negative for endocrine symptoms  Musculoskeletal: Back pain  Neurological: Negative for neurological symptoms  Psychologic: Negative for psychiatric symptoms

## 2020-05-13 ENCOUNTER — Other Ambulatory Visit: Payer: Self-pay

## 2020-05-13 ENCOUNTER — Ambulatory Visit (HOSPITAL_COMMUNITY)
Admission: RE | Admit: 2020-05-13 | Discharge: 2020-05-13 | Disposition: A | Discharge status: Home / Self Care | Payer: Medicare Other | Source: Ambulatory Visit | Attending: Urology | Admitting: Urology

## 2020-05-13 DIAGNOSIS — N281 Cyst of kidney, acquired: Secondary | ICD-10-CM | POA: Insufficient documentation

## 2020-05-13 DIAGNOSIS — R339 Retention of urine, unspecified: Secondary | ICD-10-CM | POA: Insufficient documentation

## 2020-05-13 DIAGNOSIS — M549 Dorsalgia, unspecified: Secondary | ICD-10-CM | POA: Diagnosis not present

## 2020-05-13 DIAGNOSIS — Q600 Renal agenesis, unilateral: Secondary | ICD-10-CM | POA: Diagnosis not present

## 2020-05-13 DIAGNOSIS — N3289 Other specified disorders of bladder: Secondary | ICD-10-CM | POA: Diagnosis not present

## 2020-05-14 NOTE — Progress Notes (Signed)
Letter mailed of results 

## 2020-05-18 DIAGNOSIS — Z79899 Other long term (current) drug therapy: Secondary | ICD-10-CM | POA: Diagnosis not present

## 2020-05-18 DIAGNOSIS — I10 Essential (primary) hypertension: Secondary | ICD-10-CM | POA: Diagnosis not present

## 2020-05-18 DIAGNOSIS — N183 Chronic kidney disease, stage 3 unspecified: Secondary | ICD-10-CM | POA: Diagnosis not present

## 2020-05-18 DIAGNOSIS — E039 Hypothyroidism, unspecified: Secondary | ICD-10-CM | POA: Diagnosis not present

## 2020-05-18 DIAGNOSIS — E785 Hyperlipidemia, unspecified: Secondary | ICD-10-CM | POA: Diagnosis not present

## 2020-05-25 DIAGNOSIS — I491 Atrial premature depolarization: Secondary | ICD-10-CM | POA: Diagnosis not present

## 2020-05-25 DIAGNOSIS — I10 Essential (primary) hypertension: Secondary | ICD-10-CM | POA: Diagnosis not present

## 2020-05-25 DIAGNOSIS — E039 Hypothyroidism, unspecified: Secondary | ICD-10-CM | POA: Diagnosis not present

## 2020-05-25 DIAGNOSIS — Z0001 Encounter for general adult medical examination with abnormal findings: Secondary | ICD-10-CM | POA: Diagnosis not present

## 2020-05-25 DIAGNOSIS — N1831 Chronic kidney disease, stage 3a: Secondary | ICD-10-CM | POA: Diagnosis not present

## 2020-05-27 ENCOUNTER — Telehealth: Payer: Self-pay

## 2020-05-27 NOTE — Telephone Encounter (Signed)
Attempted to call pt at both numbers. Could not leave message at either.

## 2020-05-27 NOTE — Progress Notes (Signed)
Letter sent.

## 2020-05-27 NOTE — Telephone Encounter (Signed)
-----   Message from Bjorn Pippin, MD sent at 05/27/2020  8:38 AM EDT ----- The renal US shows no significant kidney abnormalities.  There is a small simple cyst on the right which is of no concern.  Her bladder volume was about and she was unable to void prior to the study.

## 2020-06-24 ENCOUNTER — Encounter (HOSPITAL_COMMUNITY): Payer: Self-pay | Admitting: Emergency Medicine

## 2020-06-24 ENCOUNTER — Other Ambulatory Visit: Payer: Self-pay

## 2020-06-24 ENCOUNTER — Emergency Department (HOSPITAL_COMMUNITY)
Admission: EM | Admit: 2020-06-24 | Discharge: 2020-06-24 | Disposition: A | Discharge status: Home / Self Care | Payer: Medicare Other | Attending: Emergency Medicine | Admitting: Emergency Medicine

## 2020-06-24 DIAGNOSIS — Z96651 Presence of right artificial knee joint: Secondary | ICD-10-CM | POA: Insufficient documentation

## 2020-06-24 DIAGNOSIS — E871 Hypo-osmolality and hyponatremia: Secondary | ICD-10-CM | POA: Diagnosis not present

## 2020-06-24 DIAGNOSIS — E039 Hypothyroidism, unspecified: Secondary | ICD-10-CM | POA: Insufficient documentation

## 2020-06-24 DIAGNOSIS — I10 Essential (primary) hypertension: Secondary | ICD-10-CM | POA: Insufficient documentation

## 2020-06-24 DIAGNOSIS — Z7982 Long term (current) use of aspirin: Secondary | ICD-10-CM | POA: Insufficient documentation

## 2020-06-24 DIAGNOSIS — Z96642 Presence of left artificial hip joint: Secondary | ICD-10-CM | POA: Diagnosis not present

## 2020-06-24 DIAGNOSIS — R55 Syncope and collapse: Secondary | ICD-10-CM | POA: Diagnosis not present

## 2020-06-24 DIAGNOSIS — R404 Transient alteration of awareness: Secondary | ICD-10-CM | POA: Diagnosis not present

## 2020-06-24 DIAGNOSIS — R231 Pallor: Secondary | ICD-10-CM | POA: Diagnosis not present

## 2020-06-24 DIAGNOSIS — R6889 Other general symptoms and signs: Secondary | ICD-10-CM | POA: Diagnosis not present

## 2020-06-24 DIAGNOSIS — Z743 Need for continuous supervision: Secondary | ICD-10-CM | POA: Diagnosis not present

## 2020-06-24 DIAGNOSIS — R531 Weakness: Secondary | ICD-10-CM | POA: Diagnosis not present

## 2020-06-24 LAB — BASIC METABOLIC PANEL
Anion gap: 12 (ref 5–15)
BUN: 16 mg/dL (ref 8–23)
CO2: 23 mmol/L (ref 22–32)
Calcium: 9.4 mg/dL (ref 8.9–10.3)
Chloride: 93 mmol/L — ABNORMAL LOW (ref 98–111)
Creatinine, Ser: 0.97 mg/dL (ref 0.44–1.00)
GFR calc Af Amer: >60 mL/min (ref >60)
GFR calc non Af Amer: 53 mL/min — ABNORMAL LOW (ref >60)
Glucose, Bld: 121 mg/dL — ABNORMAL HIGH (ref 70–99)
Potassium: 3.7 mmol/L (ref 3.5–5.1)
Sodium: 128 mmol/L — ABNORMAL LOW (ref 135–145)

## 2020-06-24 LAB — CBC WITH DIFFERENTIAL/PLATELET
Abs Immature Granulocytes: 0.04 10*3/uL (ref 0.00–0.07)
Basophils Absolute: 0 10*3/uL (ref 0.0–0.1)
Basophils Relative: 1 %
Eosinophils Absolute: 0.1 10*3/uL (ref 0.0–0.5)
Eosinophils Relative: 1 %
HCT: 35.2 % — ABNORMAL LOW (ref 36.0–46.0)
Hemoglobin: 12.1 g/dL (ref 12.0–15.0)
Immature Granulocytes: 1 %
Lymphocytes Relative: 15 %
Lymphs Abs: 1.3 10*3/uL (ref 0.7–4.0)
MCH: 29.5 pg (ref 26.0–34.0)
MCHC: 34.4 g/dL (ref 30.0–36.0)
MCV: 85.9 fL (ref 80.0–100.0)
Monocytes Absolute: 0.5 10*3/uL (ref 0.1–1.0)
Monocytes Relative: 6 %
Neutro Abs: 6.7 10*3/uL (ref 1.7–7.7)
Neutrophils Relative %: 76 %
Platelets: 223 10*3/uL (ref 150–400)
RBC: 4.1 MIL/uL (ref 3.87–5.11)
RDW: 11.6 % (ref 11.5–15.5)
WBC: 8.6 10*3/uL (ref 4.0–10.5)
nRBC: 0 % (ref 0.0–0.2)

## 2020-06-24 NOTE — ED Triage Notes (Addendum)
Per EMS, pt was shopping and checking out at [employer] and couldn't find keys. Per EMS, pt had near syncope and hypotensive with sinus bradycardia at time of arrival. Pt alert and oriented. Moderate anxiety noted. cbg 136. Pt recently had changes to bp medication frequency.

## 2020-06-24 NOTE — Discharge Instructions (Addendum)
Continue your current medications.  Make sure to take your blood pressure medications when you go home today.  Follow-up with your doctor to be rechecked.  Return as needed for worsening symptoms

## 2020-06-24 NOTE — ED Provider Notes (Signed)
[facility] EMERGENCY DEPARTMENT Provider Note   CSN: [id number] Arrival date & time: 06/24/20  1045     History Chief Complaint  Patient presents with  . Near Syncope    [name] is a 86 y.o. female.  HPI   Patient presents to the ED for evaluation of weakness and a near fainting spell.  Patient states she went shopping this morning.  Usually she uses a walking stick to help her.  Patient was using a cart for support.  Patient states while she was walking she started to feel weak in her legs.  Patient tried to get back to her car and had to go back into the store and sit down.  Patient felt like she was going to pass out and her knees were going to give way.  EMS was called.  They noted her to be hypotensive and bradycardic.  No report of any intervention given.  Patient states she had one of her medications changed recently.  She has had episodes like this in the past but there may be only a couple times per year and not frequent.  She denies any fevers or chills.  No focal numbness or weakness.  No chest pain or shortness of breath.  No vomiting or diarrhea.  Past Medical History:  Diagnosis Date  . Anxiety   . Arthritis   . Hypertension   . Hypothyroidism   . Thyroid disease     Patient Active Problem List   Diagnosis Date Noted  . S/P total knee replacement, right 06/04/18 06/10/2018  . Hypertension 06/10/2018  . Hypothyroidism 06/10/2018  . Anxiety 06/10/2018  . Primary osteoarthritis of right knee   . Bilateral carpal tunnel syndrome 11/01/2016  . Osteoarthritis of left hip 08/17/2016  . S/P closed reduction of dislocated total hip prothesis   . S/P hip replacement, left 08/07/2016 05/12/2015    Past Surgical History:  Procedure Laterality Date  . BREAST SURGERY  1993   L BREAST BX - BENIGN  . HIP CLOSED REDUCTION Right 06/20/2015   Procedure: CLOSED REDUCTION RIGHT HIP;  Surgeon: Vickki Hearing, MD;  Location: [facility];  Service: Orthopedics;  Laterality:  Right;  . TONSILLECTOMY    . TOTAL HIP ARTHROPLASTY Right 05/12/2015   Procedure: RIGHT TOTAL HIP ARTHROPLASTY;  Surgeon: Ranee Gosselin, MD;  Location: [facility];  Service: Orthopedics;  Laterality: Right;  . TOTAL HIP ARTHROPLASTY Left 08/17/2016   Procedure: TOTAL HIP ARTHROPLASTY;  Surgeon: Vickki Hearing, MD;  Location: [facility];  Service: Orthopedics;  Laterality: Left;  . TOTAL KNEE ARTHROPLASTY Right 06/04/2018   Procedure: TOTAL KNEE ARTHROPLASTY;  Surgeon: Vickki Hearing, MD;  Location: [facility];  Service: Orthopedics;  Laterality: Right;  . TUBAL LIGATION       OB History   No obstetric history on file.     Family History  Problem Relation Age of Onset  . Congestive Heart Failure Mother   . Kidney failure Mother   . Stroke Father     Social History   Tobacco Use  . Smoking status: Never Smoker  . Smokeless tobacco: Never Used  Vaping Use  . Vaping Use: Never used  Substance Use Topics  . Alcohol use: Yes    Comment: RARE  . Drug use: No    Home Medications Prior to Admission medications   Medication Sig Start Date End Date Taking? Authorizing Provider  aspirin EC 325 MG EC tablet Take 1 tablet (325 mg total) by  mouth daily with breakfast. 06/08/18   Vickki Hearing, MD  clonazePAM (KLONOPIN) 0.5 MG tablet TAKE 1 2 TO 1 (ONE HALF TO ONE) TABLET BY MOUTH TWICE DAILY AS NEEDED 05/19/19   [provider]  hydrochlorothiazide (HYDRODIURIL) 25 MG tablet Take 25 mg by mouth daily. 12/07/19   [provider]  HYDROcodone-acetaminophen (NORCO/VICODIN) 5-325 MG tablet Take 1 tablet by mouth every 4 (four) hours as needed for moderate pain. 06/10/18   Roena Malady, PA-C  levothyroxine (EUTHYROX) 112 MCG tablet Take by mouth. 06/09/19   [provider]  losartan (COZAAR) 100 MG tablet Take 100 mg by mouth daily. 12/07/19   [provider]  losartan-hydrochlorothiazide (HYZAAR) 100-25 MG tablet Take 1 tablet by mouth daily.    [provider]  polyethylene glycol (MIRALAX / GLYCOLAX) packet Take 17 g by mouth daily.    [provider]  potassium chloride SA (KLOR-CON) 20 MEQ tablet Take by mouth. 05/20/19   [provider]  traZODone (DESYREL) 50 MG tablet Take 2 tablets (100 mg total) by mouth at bedtime. 06/07/18   Vickki Hearing, MD    Allergies    Sulfa antibiotics and Sulfasalazine  Review of Systems   Review of Systems  All other systems reviewed and are negative.   Physical Exam Updated Vital Signs BP (!) 181/85   Pulse 70   Temp (!) 97.4 F (36.3 C) (Oral)   Resp 16   Ht 1.676 m (5\' 6" )   Wt 64.4 kg   SpO2 100%   BMI 22.92 kg/m   Physical Exam Vitals and nursing note reviewed.  Constitutional:      Appearance: She is well-developed.     Comments: Elderly frail  HENT:     Head: Normocephalic and atraumatic.     Right Ear: External ear normal.     Left Ear: External ear normal.  Eyes:     General: No scleral icterus.       Right eye: No discharge.        Left eye: No discharge.     Conjunctiva/sclera: Conjunctivae normal.  Neck:     Trachea: No tracheal deviation.  Cardiovascular:     Rate and Rhythm: Normal rate and regular rhythm.  Pulmonary:     Effort: Pulmonary effort is normal. No respiratory distress.     Breath sounds: Normal breath sounds. No stridor. No wheezing or rales.  Abdominal:     General: Bowel sounds are normal. There is no distension.     Palpations: Abdomen is soft.     Tenderness: There is no abdominal tenderness. There is no guarding or rebound.  Musculoskeletal:        General: No tenderness.     Cervical back: Neck supple.  Skin:    General: Skin is warm and dry.     Findings: No rash.  Neurological:     General: No focal deficit present.     Mental Status: She is alert.     Cranial Nerves: No cranial nerve deficit (no facial droop, extraocular movements intact, no slurred speech).     Sensory: No sensory deficit.     Motor: No  abnormal muscle tone or seizure activity.     Coordination: Coordination normal.     Comments: No facial droop, patient will lift both her upper extremities and lower extremities without difficulty, normal sensation, normal speech alert and oriented  Psychiatric:     Comments: Mildly anxious  ED Results / Procedures / Treatments   Labs (all labs ordered are listed, but only abnormal results are displayed) Labs Reviewed  CBC WITH DIFFERENTIAL/PLATELET - Abnormal; Notable for the following components:      Result Value   HCT 35.2 (*)    All other components within normal limits  BASIC METABOLIC PANEL - Abnormal; Notable for the following components:   Sodium 128 (*)    Chloride 93 (*)    Glucose, Bld 121 (*)    GFR calc non Af Amer 53 (*)    All other components within normal limits    EKG EKG Interpretation  Date/Time:  Thursday June 24 2020 10:51:18 EDT Ventricular Rate:  65 PR Interval:    QRS Duration: 101 QT Interval:  442 QTC Calculation: 460 R Axis:   72 Text Interpretation: Sinus rhythm Short PR interval No significant change since last tracing Confirmed by Linwood Dibbles [phone]) on 06/24/2020 11:32:59 AM   Radiology No results found.  Procedures Procedures (including critical care time)  Medications Ordered in ED Medications - No data to display  ED Course  I have reviewed the triage vital signs and the nursing notes.  Pertinent labs & imaging results that were available during my care of the patient were reviewed by me and considered in my medical decision making (see chart for details).  Clinical Course as of Jun 24 1346  Thu Jun 24, 2020  1246 Labs reviewed.  CBC is unremarkable.  Blood pressure is actually now elevated.  Patient did not take her blood pressure medications this morning   [JK]  1247 Metabolic panel notable for hyponatremia but this appears to be similar to previous values, only slightly decreased.  I do not think this is clinically  significant   [JK]    Clinical Course User Index [JK] Linwood Dibbles, MD   MDM Rules/Calculators/A&P                          Patient presented to the ED for evaluation of a near syncopal episode.  ED exam was reassuring.  Patient did not have any focal neurologic deficits.  No signs to suggest stroke or TIA.  Patient was normotensive and had a normal heart rhythm here in the emergency room.  Orthostatic vital signs did not show any signs of orthostatic hypotension.  Patient was given a small fluid bolus.  She was monitored.  Possible patient may have had a vasovagal event.  Certainly cannot completely exclude cardiac dysrhythmia but no signs of that here.  I think the patient is stable for discharge.  Discussed outpatient follow-up with her primary doctor.  Consider outpatient cardiac monitoring.  Warning signs precautions discussed. Final Clinical Impression(s) / ED Diagnoses Final diagnoses:  Near syncope  Chronic hyponatremia    Rx / DC Orders ED Discharge Orders    None       Linwood Dibbles, MD 06/24/20 1348

## 2020-07-01 DIAGNOSIS — Z79899 Other long term (current) drug therapy: Secondary | ICD-10-CM | POA: Diagnosis not present

## 2020-07-01 DIAGNOSIS — E039 Hypothyroidism, unspecified: Secondary | ICD-10-CM | POA: Diagnosis not present

## 2020-07-08 ENCOUNTER — Telehealth: Payer: Self-pay

## 2020-07-08 DIAGNOSIS — R55 Syncope and collapse: Secondary | ICD-10-CM

## 2020-07-08 DIAGNOSIS — I10 Essential (primary) hypertension: Secondary | ICD-10-CM | POA: Diagnosis not present

## 2020-07-08 NOTE — Telephone Encounter (Signed)
Received fax request for 72 hr holter monitor for syncope per Dr.Fagan. dr.Branch is DOD today

## 2020-07-12 ENCOUNTER — Ambulatory Visit: Payer: Medicare Other

## 2020-07-12 DIAGNOSIS — R55 Syncope and collapse: Secondary | ICD-10-CM

## 2020-07-13 DIAGNOSIS — Z23 Encounter for immunization: Secondary | ICD-10-CM | POA: Diagnosis not present

## 2020-07-18 DIAGNOSIS — R69 Illness, unspecified: Secondary | ICD-10-CM | POA: Diagnosis not present

## 2020-07-20 DIAGNOSIS — R55 Syncope and collapse: Secondary | ICD-10-CM | POA: Diagnosis not present

## 2020-07-22 ENCOUNTER — Other Ambulatory Visit: Payer: Self-pay

## 2020-08-12 NOTE — Progress Notes (Deleted)
Subjective: No diagnosis found.   [name] returns today in f/u.   She cut out tea but didn't notice and improvement in her symptoms.   She was unable to go for PT in [place].   She continues to have variable frequency from q1hr to 2.5 hrs.  She has improved nocturia with evening fluid restriction.  She has nocturia 0-1x.   GU Hx: [name] is an 86 yo WF who is sent by Dr. Ouida Sills for progressive frequency and urgency over the last 18 months.  The frequency can range from every 15-20 minutes to 2hrs.   She has intermittency and the stream is slow.  She has nocturia up to 4x.  She has no stress or urge incontinence.  She has had no hematuria.  She has had no recent UTI's.  She was treated with an antibiotic recently but the culture was negative.   She has had no stones or GU surgery. She is G3P3 NVD3.  She has no prolapse symptoms.  She is constipated.  She uses a laxative prn.   ROS:  Review of Systems  Musculoskeletal: Positive for back pain.    Allergies  Allergen Reactions  . Sulfa Antibiotics Other (See Comments)    "like pens and needles all over my body, tingling"--pt states she isnt sure if she is allergic to it because she was on another medication at the same time.   . Sulfasalazine     "like pens and needles all over my body, tingling"--pt states she isnt sure if she is allergic to it because she was on another medication at the same time.     Past Medical History:  Diagnosis Date  . Anxiety   . Arthritis   . Hypertension   . Hypothyroidism   . Thyroid disease     Past Surgical History:  Procedure Laterality Date  . BREAST SURGERY  1993   L BREAST BX - BENIGN  . HIP CLOSED REDUCTION Right 06/20/2015   Procedure: CLOSED REDUCTION RIGHT HIP;  Surgeon: Vickki Hearing, MD;  Location: [facility];  Service: Orthopedics;  Laterality: Right;  . TONSILLECTOMY    . TOTAL HIP ARTHROPLASTY Right 05/12/2015   Procedure: RIGHT TOTAL HIP ARTHROPLASTY;  Surgeon: Ranee Gosselin, MD;   Location: [facility];  Service: Orthopedics;  Laterality: Right;  . TOTAL HIP ARTHROPLASTY Left 08/17/2016   Procedure: TOTAL HIP ARTHROPLASTY;  Surgeon: Vickki Hearing, MD;  Location: [facility];  Service: Orthopedics;  Laterality: Left;  . TOTAL KNEE ARTHROPLASTY Right 06/04/2018   Procedure: TOTAL KNEE ARTHROPLASTY;  Surgeon: Vickki Hearing, MD;  Location: [facility];  Service: Orthopedics;  Laterality: Right;  . TUBAL LIGATION      Social History   Socioeconomic History  . Marital status: Married    Spouse name: Not on file  . Number of children: 3  . Years of education: Not on file  . Highest education level: Not on file  Occupational History  . Occupation: retired  Tobacco Use  . Smoking status: Never Smoker  . Smokeless tobacco: Never Used  Vaping Use  . Vaping Use: Never used  Substance and Sexual Activity  . Alcohol use: Yes    Comment: RARE  . Drug use: No  . Sexual activity: Not Currently    Birth control/protection: None  Other Topics Concern  . Not on file  Social History Narrative  . Not on file   Social Determinants of Health   Financial Resource Strain:   . Difficulty  of Paying Living Expenses: Not on file  Food Insecurity:   . Worried About Programme researcher, broadcasting/film/video in the Last Year: Not on file  . Ran Out of Food in the Last Year: Not on file  Transportation Needs:   . Lack of Transportation (Medical): Not on file  . Lack of Transportation (Non-Medical): Not on file  Physical Activity:   . Days of Exercise per Week: Not on file  . Minutes of Exercise per Session: Not on file  Stress:   . Feeling of Stress : Not on file  Social Connections:   . Frequency of Communication with Friends and Family: Not on file  . Frequency of Social Gatherings with Friends and Family: Not on file  . Attends Religious Services: Not on file  . Active Member of Clubs or Organizations: Not on file  . Attends Banker Meetings: Not on file  . Marital Status: Not on  file  Intimate Partner Violence:   . Fear of Current or Ex-Partner: Not on file  . Emotionally Abused: Not on file  . Physically Abused: Not on file  . Sexually Abused: Not on file    Family History  Problem Relation Age of Onset  . Congestive Heart Failure Mother   . Kidney failure Mother   . Stroke Father     Anti-infectives: Anti-infectives (From admission, onward)   None      Current Outpatient Medications  Medication Sig Dispense Refill  . aspirin EC 325 MG EC tablet Take 1 tablet (325 mg total) by mouth daily with breakfast. 30 tablet 0  . clonazePAM (KLONOPIN) 0.5 MG tablet TAKE 1 2 TO 1 (ONE HALF TO ONE) TABLET BY MOUTH TWICE DAILY AS NEEDED    . hydrochlorothiazide (HYDRODIURIL) 25 MG tablet Take 25 mg by mouth daily.    Marland Kitchen HYDROcodone-acetaminophen (NORCO/VICODIN) 5-325 MG tablet Take 1 tablet by mouth every 4 (four) hours as needed for moderate pain. 60 tablet 0  . levothyroxine (EUTHYROX) 112 MCG tablet Take by mouth.    . losartan (COZAAR) 100 MG tablet Take 100 mg by mouth daily.    Marland Kitchen losartan-hydrochlorothiazide (HYZAAR) 100-25 MG tablet Take 1 tablet by mouth daily.    . polyethylene glycol (MIRALAX / GLYCOLAX) packet Take 17 g by mouth daily.    . potassium chloride SA (KLOR-CON) 20 MEQ tablet Take by mouth.    . traZODone (DESYREL) 50 MG tablet Take 2 tablets (100 mg total) by mouth at bedtime. 21 tablet 0   No current facility-administered medications for this visit.   Facility-Administered Medications Ordered in Other Visits  Medication Dose Route Frequency Provider Last Rate Last Admin  . bupivacaine liposome (EXPAREL) 1.3 % injection 266 mg  20 mL Infiltration Once Constable, [facility], PA-C         Objective: Vital signs in last 24 hours: There were no vitals taken for this visit.      Physical Exam  Lab Results:  Urine culture from her last visit was negative.      Studies/Results: Records and labs from Dr. Ouida Sills reviewed.     Assessment/Plan: She has had some improvement in the frequency, urgency, nocturia and sensation of  incomplete bladder emptying with behavioral modification.   I will get a renal US to assess her PVR and upper tracts more clearly.    She has pyuria in her voided urine but her cath UA and  culture were negative at the last visit. No orders of the defined  types were placed in this encounter.    No orders of the defined types were placed in this encounter.    No follow-ups on file.    CC: Dr. Carylon Perches.      Bjorn Pippin 08/12/2020 [phone]

## 2020-08-13 ENCOUNTER — Ambulatory Visit: Payer: Medicare Other | Admitting: Urology

## 2020-09-09 NOTE — Telephone Encounter (Signed)
Antoine Poche, MD  09/03/2020 10:19 AM EST      No significant findings by hearty monitor, ordering provider is Dr Ouida Sills and results will be faxed over  Dominga Ferry MD   09/09/20 patient notified of monitor results.

## 2020-10-27 DIAGNOSIS — M19012 Primary osteoarthritis, left shoulder: Secondary | ICD-10-CM | POA: Diagnosis not present

## 2021-01-27 DIAGNOSIS — H5203 Hypermetropia, bilateral: Secondary | ICD-10-CM | POA: Diagnosis not present

## 2021-01-27 DIAGNOSIS — M19012 Primary osteoarthritis, left shoulder: Secondary | ICD-10-CM | POA: Diagnosis not present

## 2021-01-27 DIAGNOSIS — H43813 Vitreous degeneration, bilateral: Secondary | ICD-10-CM | POA: Diagnosis not present

## 2021-03-01 DIAGNOSIS — Z79899 Other long term (current) drug therapy: Secondary | ICD-10-CM | POA: Diagnosis not present

## 2021-03-01 DIAGNOSIS — I491 Atrial premature depolarization: Secondary | ICD-10-CM | POA: Diagnosis not present

## 2021-03-01 DIAGNOSIS — R000 Tachycardia, unspecified: Secondary | ICD-10-CM | POA: Diagnosis not present

## 2021-03-01 DIAGNOSIS — E039 Hypothyroidism, unspecified: Secondary | ICD-10-CM | POA: Diagnosis not present

## 2021-03-01 DIAGNOSIS — R634 Abnormal weight loss: Secondary | ICD-10-CM | POA: Diagnosis not present

## 2021-03-03 ENCOUNTER — Other Ambulatory Visit (HOSPITAL_COMMUNITY): Payer: Self-pay | Admitting: Internal Medicine

## 2021-03-03 DIAGNOSIS — R42 Dizziness and giddiness: Secondary | ICD-10-CM

## 2021-03-04 ENCOUNTER — Ambulatory Visit (HOSPITAL_COMMUNITY)
Admission: RE | Admit: 2021-03-04 | Discharge: 2021-03-04 | Disposition: A | Discharge status: Home / Self Care | Payer: Medicare Other | Source: Ambulatory Visit | Attending: Internal Medicine | Admitting: Internal Medicine

## 2021-03-04 ENCOUNTER — Other Ambulatory Visit: Payer: Self-pay

## 2021-03-04 DIAGNOSIS — R634 Abnormal weight loss: Secondary | ICD-10-CM | POA: Diagnosis not present

## 2021-03-04 DIAGNOSIS — R42 Dizziness and giddiness: Secondary | ICD-10-CM | POA: Insufficient documentation

## 2021-03-04 DIAGNOSIS — R531 Weakness: Secondary | ICD-10-CM | POA: Diagnosis not present

## 2021-05-23 DIAGNOSIS — Z79899 Other long term (current) drug therapy: Secondary | ICD-10-CM | POA: Diagnosis not present

## 2021-05-23 DIAGNOSIS — E876 Hypokalemia: Secondary | ICD-10-CM | POA: Diagnosis not present

## 2021-05-23 DIAGNOSIS — E039 Hypothyroidism, unspecified: Secondary | ICD-10-CM | POA: Diagnosis not present

## 2021-05-23 DIAGNOSIS — N183 Chronic kidney disease, stage 3 unspecified: Secondary | ICD-10-CM | POA: Diagnosis not present

## 2021-05-23 DIAGNOSIS — I10 Essential (primary) hypertension: Secondary | ICD-10-CM | POA: Diagnosis not present

## 2021-05-30 DIAGNOSIS — E039 Hypothyroidism, unspecified: Secondary | ICD-10-CM | POA: Diagnosis not present

## 2021-05-30 DIAGNOSIS — N1831 Chronic kidney disease, stage 3a: Secondary | ICD-10-CM | POA: Diagnosis not present

## 2021-05-30 DIAGNOSIS — I10 Essential (primary) hypertension: Secondary | ICD-10-CM | POA: Diagnosis not present

## 2021-05-30 DIAGNOSIS — Z6824 Body mass index (BMI) 24.0-24.9, adult: Secondary | ICD-10-CM | POA: Diagnosis not present

## 2021-05-30 DIAGNOSIS — I700 Atherosclerosis of aorta: Secondary | ICD-10-CM | POA: Diagnosis not present

## 2021-06-12 ENCOUNTER — Emergency Department (HOSPITAL_COMMUNITY): Payer: Medicare Other

## 2021-06-12 ENCOUNTER — Encounter (HOSPITAL_COMMUNITY): Payer: Self-pay | Admitting: *Deleted

## 2021-06-12 ENCOUNTER — Emergency Department (HOSPITAL_COMMUNITY)
Admission: EM | Admit: 2021-06-12 | Discharge: 2021-06-12 | Disposition: A | Discharge status: Home / Self Care | Payer: Medicare Other | Attending: Emergency Medicine | Admitting: Emergency Medicine

## 2021-06-12 ENCOUNTER — Other Ambulatory Visit: Payer: Self-pay

## 2021-06-12 DIAGNOSIS — Z96651 Presence of right artificial knee joint: Secondary | ICD-10-CM | POA: Insufficient documentation

## 2021-06-12 DIAGNOSIS — W01198A Fall on same level from slipping, tripping and stumbling with subsequent striking against other object, initial encounter: Secondary | ICD-10-CM | POA: Insufficient documentation

## 2021-06-12 DIAGNOSIS — Z7982 Long term (current) use of aspirin: Secondary | ICD-10-CM | POA: Diagnosis not present

## 2021-06-12 DIAGNOSIS — M545 Low back pain, unspecified: Secondary | ICD-10-CM | POA: Diagnosis not present

## 2021-06-12 DIAGNOSIS — R6889 Other general symptoms and signs: Secondary | ICD-10-CM | POA: Diagnosis not present

## 2021-06-12 DIAGNOSIS — I10 Essential (primary) hypertension: Secondary | ICD-10-CM | POA: Insufficient documentation

## 2021-06-12 DIAGNOSIS — Z96643 Presence of artificial hip joint, bilateral: Secondary | ICD-10-CM | POA: Insufficient documentation

## 2021-06-12 DIAGNOSIS — R202 Paresthesia of skin: Secondary | ICD-10-CM | POA: Insufficient documentation

## 2021-06-12 DIAGNOSIS — M25552 Pain in left hip: Secondary | ICD-10-CM | POA: Diagnosis not present

## 2021-06-12 DIAGNOSIS — W19XXXA Unspecified fall, initial encounter: Secondary | ICD-10-CM

## 2021-06-12 DIAGNOSIS — M533 Sacrococcygeal disorders, not elsewhere classified: Secondary | ICD-10-CM | POA: Diagnosis not present

## 2021-06-12 DIAGNOSIS — Z743 Need for continuous supervision: Secondary | ICD-10-CM | POA: Diagnosis not present

## 2021-06-12 DIAGNOSIS — E039 Hypothyroidism, unspecified: Secondary | ICD-10-CM | POA: Diagnosis not present

## 2021-06-12 DIAGNOSIS — R0689 Other abnormalities of breathing: Secondary | ICD-10-CM | POA: Diagnosis not present

## 2021-06-12 DIAGNOSIS — Z79899 Other long term (current) drug therapy: Secondary | ICD-10-CM | POA: Insufficient documentation

## 2021-06-12 DIAGNOSIS — R52 Pain, unspecified: Secondary | ICD-10-CM

## 2021-06-12 MED ORDER — FENTANYL CITRATE PF 50 MCG/ML IJ SOSY
50.0000 ug | PREFILLED_SYRINGE | Freq: Once | INTRAMUSCULAR | Status: AC
  Administered 2021-06-12: 50 ug via INTRAVENOUS
  Filled 2021-06-12: qty 1

## 2021-06-12 NOTE — Discharge Instructions (Signed)
You were seen in the emergency department today after a fall at home last night.  We took multiple images of your hips and lower back and pelvis.  All of the images showed no acute fractures.  We discussed that the images showed that you have chronic changes to your lower back and a possible fracture that may be old.  We discussed reasons for your to return to the emergency department including numbness or tingling in your groin buttocks, fever, inability to control your bowel or bladder, inability to bear weight.  Medicine return to emergency department for any reason.  Please use your Tylenol arthritis medication for pain relief in the meantime.  You will benefit from a day or 2 of rest and then gentle mobility after that.

## 2021-06-12 NOTE — ED Provider Notes (Signed)
Medical screening examination/treatment/procedure(s) were conducted as a shared visit with non-physician practitioner(s) and myself.  I personally evaluated the patient during the encounter.  Clinical Impression:   Final diagnoses:  Pain    Patient presents with complaint of lower back and left hip pain after having a fall last night.  On exam has shortening and external rotation of the left hip, she states that she has had bilateral hips replaced in the past, she has normal pulses in the feet, she is otherwise well-appearing, arrives by ambulance transport and received fentanyl prehospital.  There is some tenderness in the left sacroiliac and lumbar spine region as well, mild tenderness and bruising to the left lateral hip  Imaging of the hip, may need to reduce or consult with surgery if fractured.  Imaging of the lumbar spine  On further questioning the patient states that when she had her hip arthroplasty she was told that the right leg may be longer than the left leg, this is her baseline,  The pt does not have any focal spinal ttp - it is all paraspinal on the left - I doubt that the fall caused L5 fracture - which if it is there is likely chronic in nature.  There are no fractures of the hip or pelvis.  Pt stable for d/c  with some pain medicines.     Eber Hong, MD 06/13/21 [phone]

## 2021-06-12 NOTE — ED Triage Notes (Addendum)
Pt hit her leg on something during the night causing her to fall. Pt reports she landed on her back. Pain to lower back and left hip. EMS reports no shortening or rotation. Denies use of blood thinners. No LOC. EMS administered a total of of Fentanyl IV.

## 2021-06-12 NOTE — ED Provider Notes (Signed)
[facility] EMERGENCY DEPARTMENT Provider Note   CSN: [id number] Arrival date & time: 06/12/21  1028     History Chief Complaint  Patient presents with   [name] is a 87 y.o. female.  With past medical history of osteoarthritis status post left hip replacement in 2017, right hip replacement 2016, right knee replacement in 2019 who presents emergency department after mechanical fall.  States that last night she was getting ready for bed, took a few steps backward and tripped over the leg of the bed.  States that she fell on her bottom.  States that she hit her head without loss of consciousness.  States that she began having pain in her low back/buttock and left hip.  Pain is intermittent, sharp, 8/10.  She has not tried any medications for pain relief.  She denies feeling lightheaded, dizzy, short of breath, palpitations, chest pain prior to fall.  She has existing neuropathy, with mild numbness and tingling of her bilateral feet which is unchanged from previous.  Denies fevers, incontinence of bowel or bladder, saddle anesthesia, malignancy, IV drug use.  Stated she wanted to come last night however it was late and so she decided to come this morning.  Not anticoagulated.  She says she has 2 walkers and a cane at home.  Was not using either during incident.   Fall Pertinent negatives include no chest pain.      Past Medical History:  Diagnosis Date   Anxiety    Arthritis    Hypertension    Hypothyroidism    Thyroid disease     Patient Active Problem List   Diagnosis Date Noted   S/P total knee replacement, right 06/04/18 06/10/2018   Hypertension 06/10/2018   Hypothyroidism 06/10/2018   Anxiety 06/10/2018   Primary osteoarthritis of right knee    Bilateral carpal tunnel syndrome 11/01/2016   Osteoarthritis of left hip 08/17/2016   S/P closed reduction of dislocated total hip prothesis    S/P hip replacement, left 08/07/2016 05/12/2015    Past Surgical  History:  Procedure Laterality Date   BREAST SURGERY  1993   L BREAST BX - BENIGN   HIP CLOSED REDUCTION Right 06/20/2015   Procedure: CLOSED REDUCTION RIGHT HIP;  Surgeon: Vickki Hearing, MD;  Location: [facility];  Service: Orthopedics;  Laterality: Right;   TONSILLECTOMY     TOTAL HIP ARTHROPLASTY Right 05/12/2015   Procedure: RIGHT TOTAL HIP ARTHROPLASTY;  Surgeon: Ranee Gosselin, MD;  Location: [facility];  Service: Orthopedics;  Laterality: Right;   TOTAL HIP ARTHROPLASTY Left 08/17/2016   Procedure: TOTAL HIP ARTHROPLASTY;  Surgeon: Vickki Hearing, MD;  Location: [facility];  Service: Orthopedics;  Laterality: Left;   TOTAL KNEE ARTHROPLASTY Right 06/04/2018   Procedure: TOTAL KNEE ARTHROPLASTY;  Surgeon: Vickki Hearing, MD;  Location: [facility];  Service: Orthopedics;  Laterality: Right;   TUBAL LIGATION       OB History   No obstetric history on file.     Family History  Problem Relation Age of Onset   Congestive Heart Failure Mother    Kidney failure Mother    Stroke Father     Social History   Tobacco Use   Smoking status: Never   Smokeless tobacco: Never  Vaping Use   Vaping Use: Never used  Substance Use Topics   Alcohol use: Yes    Comment: RARE   Drug use: No    Home Medications Prior to Admission  medications   Medication Sig Start Date End Date Taking? Authorizing Provider  aspirin EC 325 MG EC tablet Take 1 tablet (325 mg total) by mouth daily with breakfast. 06/08/18   Vickki Hearing, MD  clonazePAM (KLONOPIN) 0.5 MG tablet TAKE 1 2 TO 1 (ONE HALF TO ONE) TABLET BY MOUTH TWICE DAILY AS NEEDED 05/19/19   [provider]  hydrochlorothiazide (HYDRODIURIL) 25 MG tablet Take 25 mg by mouth daily. 12/07/19   [provider]  HYDROcodone-acetaminophen (NORCO/VICODIN) 5-325 MG tablet Take 1 tablet by mouth every 4 (four) hours as needed for moderate pain. 06/10/18   Roena Malady, PA-C  levothyroxine (EUTHYROX) 112 MCG tablet Take by mouth. 06/09/19    [provider]  losartan (COZAAR) 100 MG tablet Take 100 mg by mouth daily. 12/07/19   [provider]  losartan-hydrochlorothiazide (HYZAAR) 100-25 MG tablet Take 1 tablet by mouth daily.    [provider]  polyethylene glycol (MIRALAX / GLYCOLAX) packet Take 17 g by mouth daily.    [provider]  potassium chloride SA (KLOR-CON) 20 MEQ tablet Take by mouth. 05/20/19   [provider]  traZODone (DESYREL) 50 MG tablet Take 2 tablets (100 mg total) by mouth at bedtime. 06/07/18   Vickki Hearing, MD    Allergies    Sulfa antibiotics and Sulfasalazine  Review of Systems   Review of Systems  Constitutional:  Negative for fever.  Cardiovascular:  Negative for chest pain.  Musculoskeletal:  Positive for back pain. Negative for neck pain.  Neurological:  Negative for dizziness, syncope, light-headedness and numbness.  All other systems reviewed and are negative.  Physical Exam Updated Vital Signs BP (!) 172/80 (BP Location: Right Arm)   Pulse 69   Temp 97.8 F (36.6 C) (Oral)   Resp 20   SpO2 100%   Physical Exam Vitals and nursing note reviewed. Exam conducted with a chaperone present.  Constitutional:      Appearance: Normal appearance.  HENT:     Head: Normocephalic and atraumatic.  Eyes:     General: No scleral icterus. Cardiovascular:     Rate and Rhythm: Normal rate and regular rhythm.     Pulses: Normal pulses.     Heart sounds: Normal heart sounds.  Pulmonary:     Effort: Pulmonary effort is normal. No respiratory distress.     Breath sounds: Normal breath sounds.  Abdominal:     General: Bowel sounds are normal.     Palpations: Abdomen is soft.  Musculoskeletal:     Cervical back: Normal range of motion and neck supple. No tenderness. Normal range of motion.     Lumbar back: Tenderness present.       Back:     Right hip: Normal.     Left hip: Tenderness and bony tenderness present.     Right knee: Normal.      Left knee: Normal.     Right lower leg: No edema.     Left lower leg: No edema.     Comments: Left leg is shorter than the right, without obvious deformity.  Patient states that since her bilateral hip replacements the left has been shorter than the right.  States that her surgeon told her about this.  This is not new finding  Skin:    Capillary Refill: Capillary refill takes less than 2 seconds.     Findings: No rash.  Neurological:     General: No focal deficit present.  Mental Status: She is alert and oriented to person, place, and time.     Sensory: No sensory deficit.  Psychiatric:        Mood and Affect: Mood normal.        Behavior: Behavior normal.        Thought Content: Thought content normal.        Judgment: Judgment normal.    ED Results / Procedures / Treatments   Labs (all labs ordered are listed, but only abnormal results are displayed) Labs Reviewed - No data to display  EKG None  Radiology DG Lumbar Spine Complete  Result Date: 06/12/2021 CLINICAL DATA:  Fall LEFT hip and low back pain in a 87 year old female. EXAM: LUMBAR SPINE - COMPLETE 4+ VIEW COMPARISON:  MR of the lumbar spine. No prior dedicated spine imaging. FINDINGS: EKG leads project over the abdomen. Incidental note is made of bilateral hip arthroplasty changes.a calcific density projecting to the RIGHT of the L2-3 transverse processes between the transverse processes. Marked degenerative changes of the lumbar spine and osteopenia. Mild endplate concavity at L1 is age indeterminate. L5 compression fracture is suspected, this area is difficult to assess due to oblique nature of images and the degree of osteopenia. Degree of loss of height is difficult to evaluate but suspect up to 40%. There is mild levoconvex curvature of the lumbar spine. There are 5 lumbar type vertebral bodies. IMPRESSION: L5 compression fracture is suspected, area is difficult to assess due to oblique nature of images and the degree  of osteopenia. Alignment is grossly preserved on lateral projection. Perhaps some retropulsion at the level of suspected fracture. L1 endplate concavity, age indeterminate. Mild 10% loss of height at this level. Calcification projecting to the RIGHT of the spine at L2-3 may represent a vascular calcification or could represent a small renal calculus. Correlate with symptoms and urinalysis as warranted. Marked degenerative changes of the lumbar spine and osteopenia. Electronically Signed   By: Donzetta KohutGeoffrey  Wile M.D.   On: 06/12/2021 13:17   DG Sacrum/Coccyx  Result Date: 06/12/2021 CLINICAL DATA:  Left hip and lower back pain following a fall last night. EXAM: SACRUM AND COCCYX - 2+ VIEW COMPARISON:  Portable pelvis dated 08/17/2016. Lumbar spine radiographs obtained today. FINDINGS: The sacrococcygeal region is poorly visualized on the lateral view due to osteopenia. No gross fractures or subluxations are seen. Bilateral hip prostheses are noted as well as lower lumbar spine degenerative changes. IMPRESSION: No sacrococcygeal fracture or subluxation seen. Electronically Signed   By: Beckie SaltsSteven  Reid M.D.   On: 06/12/2021 13:09   DG Hip Unilat W or Wo Pelvis 2-3 Views Left  Result Date: 06/12/2021 CLINICAL DATA:  Fall last night with low back and left hip pain. EXAM: DG HIP (WITH OR WITHOUT PELVIS) 2-3V LEFT COMPARISON:  09/04/2018, images only FINDINGS: Bilateral hip arthroplasty. Osteopenia. Sacroiliac joints are symmetric. Degenerate irregularity about the symphysis pubis. No fracture or hardware complication identified. IMPRESSION: Bilateral hip arthroplasty, without acute osseous abnormality. Electronically Signed   By: Jeronimo GreavesKyle  Talbot M.D.   On: 06/12/2021 11:39    Procedures Procedures   Medications Ordered in ED Medications  fentaNYL (SUBLIMAZE) injection 50 mcg (has no administration in time range)    ED Course  I have reviewed the triage vital signs and the nursing notes.  Pertinent labs &  imaging results that were available during my care of the patient were reviewed by me and considered in my medical decision making (see chart for details).  MDM Rules/Calculators/A&P [name] is an 87 year old female who presented to the emergency department after mechanical fall.  Initially appeared that she had deformity to the left lower extremity as it was shortened; however on questioning she states this is chronic.  I am able to move both lower extremities throughout range of motion.  Initial radiologic findings without fracture or hardware complication identified.   However on physical exam her pain is more at the left sacroiliac joint and lumbar spine region.  There is a small ecchymotic area to the left lateral hip.  Because of this obtained lumbar sacral imaging to confirm no bony abnormality. Imaging showed no sacrococcygeal fracture or subluxation.  Lumbar radiography shows degenerative changes, L5 fracture of unknown age.  I discussed this case with my attending physician who cosigned this note including patient's presenting symptoms, physical exam, and planned diagnostics and interventions. Attending physician stated agreement with plan or made changes to plan which were implemented.   Attending physician assessed patient at bedside.   Pain controlled after fentanyl 50 mcg.  I reviewed the imaging with the patient and family member at bedside.  Given age and use of benzodiazepines at home we will not prescribe narcotics.  Patient is agreeable to this.  She states she takes Tylenol for her arthritis and will use this.  I have also instructed her to rest her back for the next 1 to 2 days with gentle range of motion and walking.  She is agreeable to this plan. Final Clinical Impression(s) / ED Diagnoses Final diagnoses:  Fall, initial encounter    Rx / DC Orders ED Discharge Orders     None        Cristopher Peru, PA-C 06/12/21 2023    Eber Hong, MD 06/13/21  [phone]

## 2021-07-11 DIAGNOSIS — Z23 Encounter for immunization: Secondary | ICD-10-CM | POA: Diagnosis not present

## 2021-09-01 DIAGNOSIS — E039 Hypothyroidism, unspecified: Secondary | ICD-10-CM | POA: Diagnosis not present

## 2021-09-08 DIAGNOSIS — I10 Essential (primary) hypertension: Secondary | ICD-10-CM | POA: Diagnosis not present

## 2021-09-08 DIAGNOSIS — E039 Hypothyroidism, unspecified: Secondary | ICD-10-CM | POA: Diagnosis not present

## 2021-11-30 DIAGNOSIS — E039 Hypothyroidism, unspecified: Secondary | ICD-10-CM | POA: Diagnosis not present

## 2021-12-07 DIAGNOSIS — E039 Hypothyroidism, unspecified: Secondary | ICD-10-CM | POA: Diagnosis not present

## 2021-12-07 DIAGNOSIS — G4700 Insomnia, unspecified: Secondary | ICD-10-CM | POA: Diagnosis not present

## 2022-03-31 DIAGNOSIS — E785 Hyperlipidemia, unspecified: Secondary | ICD-10-CM | POA: Diagnosis not present

## 2022-03-31 DIAGNOSIS — I10 Essential (primary) hypertension: Secondary | ICD-10-CM | POA: Diagnosis not present

## 2022-05-11 DIAGNOSIS — H35373 Puckering of macula, bilateral: Secondary | ICD-10-CM | POA: Diagnosis not present

## 2022-05-11 DIAGNOSIS — Z961 Presence of intraocular lens: Secondary | ICD-10-CM | POA: Diagnosis not present

## 2022-05-30 DIAGNOSIS — Z79899 Other long term (current) drug therapy: Secondary | ICD-10-CM | POA: Diagnosis not present

## 2022-05-30 DIAGNOSIS — E876 Hypokalemia: Secondary | ICD-10-CM | POA: Diagnosis not present

## 2022-05-30 DIAGNOSIS — I10 Essential (primary) hypertension: Secondary | ICD-10-CM | POA: Diagnosis not present

## 2022-05-30 DIAGNOSIS — N1831 Chronic kidney disease, stage 3a: Secondary | ICD-10-CM | POA: Diagnosis not present

## 2022-05-30 DIAGNOSIS — E039 Hypothyroidism, unspecified: Secondary | ICD-10-CM | POA: Diagnosis not present

## 2022-06-06 DIAGNOSIS — N1831 Chronic kidney disease, stage 3a: Secondary | ICD-10-CM | POA: Diagnosis not present

## 2022-06-06 DIAGNOSIS — Z6823 Body mass index (BMI) 23.0-23.9, adult: Secondary | ICD-10-CM | POA: Diagnosis not present

## 2022-06-06 DIAGNOSIS — M1991 Primary osteoarthritis, unspecified site: Secondary | ICD-10-CM | POA: Diagnosis not present

## 2022-06-06 DIAGNOSIS — Z23 Encounter for immunization: Secondary | ICD-10-CM | POA: Diagnosis not present

## 2022-06-06 DIAGNOSIS — Z0000 Encounter for general adult medical examination without abnormal findings: Secondary | ICD-10-CM | POA: Diagnosis not present

## 2022-06-06 DIAGNOSIS — I10 Essential (primary) hypertension: Secondary | ICD-10-CM | POA: Diagnosis not present

## 2022-06-06 DIAGNOSIS — E039 Hypothyroidism, unspecified: Secondary | ICD-10-CM | POA: Diagnosis not present

## 2022-06-13 ENCOUNTER — Other Ambulatory Visit: Payer: Self-pay | Admitting: Internal Medicine

## 2022-06-13 ENCOUNTER — Telehealth: Payer: Self-pay | Admitting: *Deleted

## 2022-06-13 DIAGNOSIS — R001 Bradycardia, unspecified: Secondary | ICD-10-CM

## 2022-06-13 NOTE — Telephone Encounter (Signed)
Faxed request to have Zio monitor sent to pt.

## 2022-06-21 ENCOUNTER — Ambulatory Visit: Payer: TRICARE For Life (TFL) | Attending: Internal Medicine

## 2022-06-21 DIAGNOSIS — R001 Bradycardia, unspecified: Secondary | ICD-10-CM

## 2022-06-24 DIAGNOSIS — R001 Bradycardia, unspecified: Secondary | ICD-10-CM | POA: Diagnosis not present

## 2022-07-04 DIAGNOSIS — R001 Bradycardia, unspecified: Secondary | ICD-10-CM | POA: Diagnosis not present

## 2022-07-06 ENCOUNTER — Telehealth: Payer: Self-pay | Admitting: General Practice

## 2022-07-06 NOTE — Telephone Encounter (Signed)
Please result as DOD.

## 2022-07-06 NOTE — Telephone Encounter (Signed)
Dr. Ria Comment office called for results to this patient zio patch monitor they would like if faxed to [phone].

## 2022-09-07 DIAGNOSIS — R634 Abnormal weight loss: Secondary | ICD-10-CM | POA: Diagnosis not present

## 2022-09-07 DIAGNOSIS — I10 Essential (primary) hypertension: Secondary | ICD-10-CM | POA: Diagnosis not present

## 2022-09-07 DIAGNOSIS — R413 Other amnesia: Secondary | ICD-10-CM | POA: Diagnosis not present

## 2023-01-25 DIAGNOSIS — R634 Abnormal weight loss: Secondary | ICD-10-CM | POA: Diagnosis not present

## 2023-01-25 DIAGNOSIS — G4700 Insomnia, unspecified: Secondary | ICD-10-CM | POA: Diagnosis not present

## 2023-02-28 DIAGNOSIS — H02135 Senile ectropion of left lower eyelid: Secondary | ICD-10-CM | POA: Diagnosis not present

## 2023-02-28 DIAGNOSIS — H0288A Meibomian gland dysfunction right eye, upper and lower eyelids: Secondary | ICD-10-CM | POA: Diagnosis not present

## 2023-02-28 DIAGNOSIS — H02132 Senile ectropion of right lower eyelid: Secondary | ICD-10-CM | POA: Diagnosis not present

## 2023-02-28 DIAGNOSIS — H0288B Meibomian gland dysfunction left eye, upper and lower eyelids: Secondary | ICD-10-CM | POA: Diagnosis not present

## 2023-05-15 DIAGNOSIS — H35373 Puckering of macula, bilateral: Secondary | ICD-10-CM | POA: Diagnosis not present

## 2023-05-15 DIAGNOSIS — Z961 Presence of intraocular lens: Secondary | ICD-10-CM | POA: Diagnosis not present

## 2023-06-01 DIAGNOSIS — E039 Hypothyroidism, unspecified: Secondary | ICD-10-CM | POA: Diagnosis not present

## 2023-06-01 DIAGNOSIS — Z79899 Other long term (current) drug therapy: Secondary | ICD-10-CM | POA: Diagnosis not present

## 2023-06-01 DIAGNOSIS — M1990 Unspecified osteoarthritis, unspecified site: Secondary | ICD-10-CM | POA: Diagnosis not present

## 2023-06-01 DIAGNOSIS — N1831 Chronic kidney disease, stage 3a: Secondary | ICD-10-CM | POA: Diagnosis not present

## 2023-06-01 DIAGNOSIS — E876 Hypokalemia: Secondary | ICD-10-CM | POA: Diagnosis not present

## 2023-06-08 DIAGNOSIS — Z0000 Encounter for general adult medical examination without abnormal findings: Secondary | ICD-10-CM | POA: Diagnosis not present

## 2023-06-08 DIAGNOSIS — I10 Essential (primary) hypertension: Secondary | ICD-10-CM | POA: Diagnosis not present

## 2023-06-08 DIAGNOSIS — Z23 Encounter for immunization: Secondary | ICD-10-CM | POA: Diagnosis not present

## 2023-06-08 DIAGNOSIS — N1831 Chronic kidney disease, stage 3a: Secondary | ICD-10-CM | POA: Diagnosis not present

## 2023-06-08 DIAGNOSIS — E039 Hypothyroidism, unspecified: Secondary | ICD-10-CM | POA: Diagnosis not present

## 2023-06-08 DIAGNOSIS — M1990 Unspecified osteoarthritis, unspecified site: Secondary | ICD-10-CM | POA: Diagnosis not present

## 2023-12-11 DIAGNOSIS — I10 Essential (primary) hypertension: Secondary | ICD-10-CM | POA: Diagnosis not present

## 2023-12-11 DIAGNOSIS — Z79899 Other long term (current) drug therapy: Secondary | ICD-10-CM | POA: Diagnosis not present

## 2023-12-11 DIAGNOSIS — E039 Hypothyroidism, unspecified: Secondary | ICD-10-CM | POA: Diagnosis not present

## 2024-05-01 ENCOUNTER — Encounter (HOSPITAL_COMMUNITY): Payer: Self-pay

## 2024-05-01 ENCOUNTER — Emergency Department (HOSPITAL_COMMUNITY)

## 2024-05-01 ENCOUNTER — Emergency Department (HOSPITAL_COMMUNITY)
Admission: EM | Admit: 2024-05-01 | Discharge: 2024-05-01 | Discharge status: Against Medical Advice | Attending: Emergency Medicine | Admitting: Emergency Medicine

## 2024-05-01 DIAGNOSIS — M25552 Pain in left hip: Secondary | ICD-10-CM | POA: Diagnosis not present

## 2024-05-01 DIAGNOSIS — Z5321 Procedure and treatment not carried out due to patient leaving prior to being seen by health care provider: Secondary | ICD-10-CM | POA: Insufficient documentation

## 2024-05-01 DIAGNOSIS — W19XXXA Unspecified fall, initial encounter: Secondary | ICD-10-CM | POA: Insufficient documentation

## 2024-05-01 DIAGNOSIS — M47816 Spondylosis without myelopathy or radiculopathy, lumbar region: Secondary | ICD-10-CM | POA: Diagnosis not present

## 2024-05-01 DIAGNOSIS — S32512A Fracture of superior rim of left pubis, initial encounter for closed fracture: Secondary | ICD-10-CM | POA: Diagnosis not present

## 2024-05-01 DIAGNOSIS — Z96643 Presence of artificial hip joint, bilateral: Secondary | ICD-10-CM | POA: Diagnosis not present

## 2024-05-01 NOTE — ED Triage Notes (Signed)
 Pt comes in for left hip pain. Pt fell on Sunday and wondering if it has been displaced. Pt felt a little better after fall so did come to the ED on Sunday. Pt has had bilateral hip replacements. A&Ox4. Hard of hearing.

## 2024-05-02 ENCOUNTER — Other Ambulatory Visit: Payer: Self-pay

## 2024-05-02 ENCOUNTER — Emergency Department (HOSPITAL_COMMUNITY)

## 2024-05-02 ENCOUNTER — Emergency Department (HOSPITAL_COMMUNITY)
Admission: EM | Admit: 2024-05-02 | Discharge: 2024-05-02 | Disposition: A | Discharge status: Home / Self Care | Attending: Emergency Medicine | Admitting: Emergency Medicine

## 2024-05-02 ENCOUNTER — Encounter (HOSPITAL_COMMUNITY): Payer: Self-pay

## 2024-05-02 DIAGNOSIS — K8020 Calculus of gallbladder without cholecystitis without obstruction: Secondary | ICD-10-CM | POA: Diagnosis not present

## 2024-05-02 DIAGNOSIS — S79912A Unspecified injury of left hip, initial encounter: Secondary | ICD-10-CM | POA: Diagnosis present

## 2024-05-02 DIAGNOSIS — Z7982 Long term (current) use of aspirin: Secondary | ICD-10-CM | POA: Insufficient documentation

## 2024-05-02 DIAGNOSIS — W19XXXA Unspecified fall, initial encounter: Secondary | ICD-10-CM | POA: Diagnosis not present

## 2024-05-02 DIAGNOSIS — S32502A Unspecified fracture of left pubis, initial encounter for closed fracture: Secondary | ICD-10-CM | POA: Insufficient documentation

## 2024-05-02 DIAGNOSIS — S32592A Other specified fracture of left pubis, initial encounter for closed fracture: Secondary | ICD-10-CM | POA: Diagnosis not present

## 2024-05-02 DIAGNOSIS — M25552 Pain in left hip: Secondary | ICD-10-CM | POA: Diagnosis not present

## 2024-05-02 DIAGNOSIS — S32512A Fracture of superior rim of left pubis, initial encounter for closed fracture: Secondary | ICD-10-CM | POA: Diagnosis not present

## 2024-05-02 MED ORDER — TRAMADOL HCL 50 MG PO TABS: 50.0000 mg | ORAL_TABLET | Freq: Four times a day (QID) | ORAL | 0 refills | Status: AC | PRN

## 2024-05-02 NOTE — ED Triage Notes (Signed)
 Patient arrived via [facility] from home and lives with daughter. C/O left hip pain, fell about 1 week ago. Has hx artificial hip and per patient feels like has come out. Left AMA from [facility] ER yesterday before being given XRAY results.  Pain 2/10 to left hip when at rest on arrival. VS 180/90 HR 89, O2 98 on RA with EMS.

## 2024-05-02 NOTE — Discharge Instructions (Signed)
 It appears you broke your pubic rami.  You can weight-bear as tolerated.  Tylenol  may help with the pain.  We have not been able to completely rule out a fracture in the pelvis near the hip however clinically does not appear that there is 1.  Follow-up with your orthopedic surgeon

## 2024-05-02 NOTE — ED Provider Notes (Signed)
 [place] EMERGENCY DEPARTMENT AT [facility] Provider Note   CSN: [id number] Arrival date & time: 05/02/24  9245     Patient presents with: Hip Pain   [name] is a 89 y.o. female.    Hip Pain  Patient presents with fall about a week ago.  Had been to ER yesterday but had not completed evaluation.  Had x-ray done did show pubic rami fracture.  Has been walking with a walker.  Pain in left pelvic/hip area.  Not on blood thinners.  Did not hit head.  Previous hip replacement is worried that her hip is dislocated.     Prior to Admission medications   Medication Sig Start Date End Date Taking? Authorizing Provider  traMADol  (ULTRAM ) 50 MG tablet Take 1 tablet (50 mg total) by mouth every 6 (six) hours as needed. 05/02/24  Yes Patsey Lot, MD  amLODipine (NORVASC) 2.5 MG tablet Take 2.5 mg by mouth at bedtime. 05/11/21   [provider]  aspirin  EC 325 MG EC tablet Take 1 tablet (325 mg total) by mouth daily with breakfast. 06/08/18   Margrette Taft BRAVO, MD  clonazePAM  (KLONOPIN ) 0.5 MG tablet Take 0.25-0.5 mg by mouth 2 (two) times daily as needed for anxiety. 05/19/19   [provider]  hydrochlorothiazide  (HYDRODIURIL ) 25 MG tablet Take 25 mg by mouth daily. 12/07/19   [provider]  levothyroxine  (SYNTHROID ) 112 MCG tablet Take 112 mcg by mouth daily before breakfast. 06/09/19   [provider]  losartan  (COZAAR ) 100 MG tablet Take 100 mg by mouth daily. 12/07/19   [provider]  polyethylene glycol (MIRALAX  / GLYCOLAX ) packet Take 17 g by mouth daily as needed for mild constipation or moderate constipation.    [provider]  potassium chloride  SA (KLOR-CON ) 20 MEQ tablet Take 20 mEq by mouth daily. 05/20/19   [provider]  traZODone  (DESYREL ) 50 MG tablet Take 2 tablets (100 mg total) by mouth at bedtime. 06/07/18   Margrette Taft BRAVO, MD    Allergies: Sulfa antibiotics and Sulfasalazine     Review of Systems  Updated Vital Signs BP (!) 174/86   Pulse 84   Temp 98 F (36.7 C) (Oral)   Resp 17   Ht 5' 6 (1.676 m)   Wt 55.8 kg   SpO2 99%   BMI 19.85 kg/m   Physical Exam Vitals and nursing note reviewed.  Chest:     Chest wall: No tenderness.  Abdominal:     Tenderness: There is no abdominal tenderness.  Musculoskeletal:     Comments: Does have some anterior pelvic tenderness.  Relatively good range of motion of the left hip.  No lateral tenderness.  No large ecchymosis.  Neurological:     Mental Status: She is alert.     (all labs ordered are listed, but only abnormal results are displayed) Labs Reviewed - No data to display  EKG: None  Radiology: CT PELVIS WO CONTRAST Result Date: 05/02/2024 CLINICAL DATA:  89 year old female status post fall 4-5 days ago with persistent left hip pain. Left pubic rami fractures on radiographs yesterday. EXAM: CT PELVIS WITHOUT CONTRAST TECHNIQUE: Multidetector CT imaging of the pelvis was performed following the standard protocol without intravenous contrast. RADIATION DOSE REDUCTION: This exam was performed according to the departmental dose-optimization program which includes automated exposure control, adjustment of the mA and/or kV according to patient size and/or use of iterative reconstruction technique. COMPARISON:  Left hip radiographs 05/01/2024. FINDINGS: Urinary  Tract: Kidneys not included. Moderately distended urinary bladder. No hydroureter. Bowel: Nondilated large and small bowel loops. Retained stool in the large bowel. Vascular/Lymphatic: Aortoiliac calcified atherosclerosis. Vascular patency is not evaluated in the absence of IV contrast. No lymphadenopathy identified. Reproductive:  Negative noncontrast appearance. Other: No free fluid. Gallstones are visible in the gallbladder fundus, individually 15 mm. Musculoskeletal: Bilateral hip arthroplasty with substantial metallic hardware streak artifact in the pelvis.  Left-side total arthroplasty hardware. Right side bipolar arthroplasty. Acetabular and femoral components appear aligned. Visible proximal femurs appear intact. Underlying osteopenia. Comminuted fracture of the left superior pubic ramus which is partially longitudinal, extends from near the symphysis laterally toward the acetabulum (series 8, images 35 through 43. More simple in oblique fracture of the left inferior pubic ramus on series 5, image 106. Contralateral healed right inferior pubic ramus fracture. The integrity of the medial wall of the left acetabulum cannot be a evaluated due to streak artifact on series 5, image 87. Otherwise the left hemipelvis appears intact. Degenerated lower lumbar levels appear intact. Sacrum, SI joints, right hemipelvis appear intact. Left pelvic sidewall hematoma is evident on series 5, image 87, mild-to-moderate. IMPRESSION: 1. Comminuted fractures of the left superior and inferior pubic rami. Underlying left total hip arthroplasty and osteopenia. And due to bilateral hip arthroplasty streak artifact, the central portion of the left acetabulum is poorly visible. Mild to moderate Left pelvic sidewall hematoma is evident. A central left acetabular fracture is difficult to exclude. 2. No other acute traumatic injury identified in the pelvis. 3. Cholelithiasis.  Aortic Atherosclerosis (ICD10-I70.0). Electronically Signed   By: VEAR Hurst M.D.   On: 05/02/2024 08:50   DG Hip Unilat W or Wo Pelvis 2-3 Views Left Result Date: 05/01/2024 CLINICAL DATA:  Left hip pain after a fall on Sunday. EXAM: DG HIP (WITH OR WITHOUT PELVIS) 2-3V LEFT COMPARISON:  06/12/2021 FINDINGS: Left total hip arthroplasty using non cemented components. Components appear well seated. No dislocation. Acute fractures of the left superior and inferior pubic rami with minimal displacement. Probable old fracture deformity of the right inferior pubic ramus. Right hip arthroplasty is incompletely included but  visualized portion appears intact. Degenerative changes in the lumbar spine. Sacrum appears intact. SI joints and symphysis pubis are not displaced. IMPRESSION: 1. Left total hip arthroplasty appears well seated.  No dislocation. 2. Acute fracture of the left superior and inferior pubic rami with mild displacement. Electronically Signed   By: Elsie Gravely M.D.   On: 05/01/2024 20:40     Procedures   Medications Ordered in the ED - No data to display                                  Medical Decision Making Amount and/or Complexity of Data Reviewed Radiology: ordered.   Patient with left hip pain.  Recent fall.  X-ray yesterday showed pubic rami fracture.  CT scan done today to rule out other pelvic injuries.  Hip does appear located.  No other definite fractures.  CT scan cannot rule out acetabular fracture however.  However does not have much tenderness on the hip.  I think this is less likely.  May have small hematoma but vitals reassuring.  Discussed with patient and states will only use Tylenol  and a walker.  Appears stable for discharge home to follow-up with her orthopedic surgeon.  Discussed with patient and her daughter.  Initially did not want other  pain medicines but now will give some tramadol .  Has been taking daughters tramadol  at home.  I think with a short course is unlikely to have serotonergic side effects.  Discharge home. Patient     Final diagnoses:  Closed fracture of multiple pubic rami, left, initial encounter [facility])    ED Discharge Orders          Ordered    traMADol  (ULTRAM ) 50 MG tablet  Every 6 hours PRN        05/02/24 1012               Patsey Lot, MD 05/02/24 1012

## 2024-05-02 NOTE — ED Notes (Signed)
 0900 Rounded on patient, patient forgot why she was still here. RN reminded patient that she needs to get results of CT of pelvis.

## 2024-05-27 DIAGNOSIS — H43813 Vitreous degeneration, bilateral: Secondary | ICD-10-CM | POA: Diagnosis not present

## 2024-05-27 DIAGNOSIS — H524 Presbyopia: Secondary | ICD-10-CM | POA: Diagnosis not present
